# Patient Record
Sex: Male | Born: 1970 | Race: White | Hispanic: No | Smoking: Former smoker
Health system: Southern US, Community
[De-identification: ages and names within clinical notes are randomized; demographics above are authoritative.]

## PROBLEM LIST (undated history)

## (undated) DIAGNOSIS — C189 Malignant neoplasm of colon, unspecified: Secondary | ICD-10-CM

## (undated) DIAGNOSIS — R569 Unspecified convulsions: Secondary | ICD-10-CM

## (undated) DIAGNOSIS — I2510 Atherosclerotic heart disease of native coronary artery without angina pectoris: Secondary | ICD-10-CM

## (undated) DIAGNOSIS — E119 Type 2 diabetes mellitus without complications: Secondary | ICD-10-CM

## (undated) DIAGNOSIS — E785 Hyperlipidemia, unspecified: Secondary | ICD-10-CM

## (undated) DIAGNOSIS — I10 Essential (primary) hypertension: Secondary | ICD-10-CM

## (undated) HISTORY — DX: Atherosclerotic heart disease of native coronary artery without angina pectoris: I25.10

## (undated) HISTORY — DX: Malignant neoplasm of colon, unspecified: C18.9

## (undated) HISTORY — DX: Type 2 diabetes mellitus without complications: E11.9

## (undated) HISTORY — DX: Essential (primary) hypertension: I10

## (undated) HISTORY — DX: Hyperlipidemia, unspecified: E78.5

---

## 2020-09-21 ENCOUNTER — Emergency Department (HOSPITAL_COMMUNITY): Payer: Medicare HMO

## 2020-09-21 ENCOUNTER — Inpatient Hospital Stay (HOSPITAL_COMMUNITY)
Admission: EM | Admit: 2020-09-21 | Discharge: 2020-10-05 | DRG: 330 | Disposition: A | Discharge status: Home Health | Payer: Medicare HMO | Attending: Internal Medicine | Admitting: Internal Medicine

## 2020-09-21 ENCOUNTER — Encounter (HOSPITAL_COMMUNITY): Payer: Self-pay | Admitting: Emergency Medicine

## 2020-09-21 ENCOUNTER — Other Ambulatory Visit: Payer: Self-pay

## 2020-09-21 DIAGNOSIS — R197 Diarrhea, unspecified: Secondary | ICD-10-CM | POA: Diagnosis not present

## 2020-09-21 DIAGNOSIS — R9430 Abnormal result of cardiovascular function study, unspecified: Secondary | ICD-10-CM | POA: Diagnosis not present

## 2020-09-21 DIAGNOSIS — Z982 Presence of cerebrospinal fluid drainage device: Secondary | ICD-10-CM

## 2020-09-21 DIAGNOSIS — G40909 Epilepsy, unspecified, not intractable, without status epilepticus: Secondary | ICD-10-CM | POA: Diagnosis present

## 2020-09-21 DIAGNOSIS — F419 Anxiety disorder, unspecified: Secondary | ICD-10-CM | POA: Diagnosis present

## 2020-09-21 DIAGNOSIS — E876 Hypokalemia: Secondary | ICD-10-CM | POA: Diagnosis not present

## 2020-09-21 DIAGNOSIS — I5032 Chronic diastolic (congestive) heart failure: Secondary | ICD-10-CM | POA: Diagnosis present

## 2020-09-21 DIAGNOSIS — D499 Neoplasm of unspecified behavior of unspecified site: Secondary | ICD-10-CM

## 2020-09-21 DIAGNOSIS — R739 Hyperglycemia, unspecified: Secondary | ICD-10-CM | POA: Diagnosis present

## 2020-09-21 DIAGNOSIS — Z20822 Contact with and (suspected) exposure to covid-19: Secondary | ICD-10-CM | POA: Diagnosis present

## 2020-09-21 DIAGNOSIS — E440 Moderate protein-calorie malnutrition: Secondary | ICD-10-CM | POA: Diagnosis present

## 2020-09-21 DIAGNOSIS — E861 Hypovolemia: Secondary | ICD-10-CM | POA: Diagnosis present

## 2020-09-21 DIAGNOSIS — E1165 Type 2 diabetes mellitus with hyperglycemia: Secondary | ICD-10-CM | POA: Diagnosis present

## 2020-09-21 DIAGNOSIS — E785 Hyperlipidemia, unspecified: Secondary | ICD-10-CM | POA: Diagnosis present

## 2020-09-21 DIAGNOSIS — D500 Iron deficiency anemia secondary to blood loss (chronic): Secondary | ICD-10-CM | POA: Diagnosis not present

## 2020-09-21 DIAGNOSIS — B3781 Candidal esophagitis: Secondary | ICD-10-CM | POA: Diagnosis present

## 2020-09-21 DIAGNOSIS — I712 Thoracic aortic aneurysm, without rupture: Secondary | ICD-10-CM | POA: Diagnosis present

## 2020-09-21 DIAGNOSIS — C186 Malignant neoplasm of descending colon: Secondary | ICD-10-CM | POA: Diagnosis not present

## 2020-09-21 DIAGNOSIS — Z8249 Family history of ischemic heart disease and other diseases of the circulatory system: Secondary | ICD-10-CM

## 2020-09-21 DIAGNOSIS — C182 Malignant neoplasm of ascending colon: Secondary | ICD-10-CM | POA: Diagnosis present

## 2020-09-21 DIAGNOSIS — R7989 Other specified abnormal findings of blood chemistry: Secondary | ICD-10-CM | POA: Diagnosis not present

## 2020-09-21 DIAGNOSIS — E1169 Type 2 diabetes mellitus with other specified complication: Secondary | ICD-10-CM | POA: Diagnosis present

## 2020-09-21 DIAGNOSIS — E669 Obesity, unspecified: Secondary | ICD-10-CM | POA: Diagnosis present

## 2020-09-21 DIAGNOSIS — E871 Hypo-osmolality and hyponatremia: Secondary | ICD-10-CM | POA: Diagnosis present

## 2020-09-21 DIAGNOSIS — R0600 Dyspnea, unspecified: Secondary | ICD-10-CM | POA: Diagnosis not present

## 2020-09-21 DIAGNOSIS — R195 Other fecal abnormalities: Secondary | ICD-10-CM | POA: Diagnosis present

## 2020-09-21 DIAGNOSIS — Z6831 Body mass index (BMI) 31.0-31.9, adult: Secondary | ICD-10-CM | POA: Diagnosis not present

## 2020-09-21 DIAGNOSIS — D630 Anemia in neoplastic disease: Secondary | ICD-10-CM | POA: Diagnosis present

## 2020-09-21 DIAGNOSIS — R569 Unspecified convulsions: Secondary | ICD-10-CM

## 2020-09-21 DIAGNOSIS — I2510 Atherosclerotic heart disease of native coronary artery without angina pectoris: Secondary | ICD-10-CM | POA: Diagnosis present

## 2020-09-21 DIAGNOSIS — Z885 Allergy status to narcotic agent status: Secondary | ICD-10-CM

## 2020-09-21 DIAGNOSIS — Z79899 Other long term (current) drug therapy: Secondary | ICD-10-CM

## 2020-09-21 DIAGNOSIS — Z801 Family history of malignant neoplasm of trachea, bronchus and lung: Secondary | ICD-10-CM

## 2020-09-21 DIAGNOSIS — I509 Heart failure, unspecified: Secondary | ICD-10-CM

## 2020-09-21 DIAGNOSIS — I110 Hypertensive heart disease with heart failure: Secondary | ICD-10-CM | POA: Diagnosis present

## 2020-09-21 DIAGNOSIS — R0602 Shortness of breath: Secondary | ICD-10-CM | POA: Diagnosis present

## 2020-09-21 DIAGNOSIS — D62 Acute posthemorrhagic anemia: Secondary | ICD-10-CM | POA: Diagnosis present

## 2020-09-21 DIAGNOSIS — R931 Abnormal findings on diagnostic imaging of heart and coronary circulation: Secondary | ICD-10-CM | POA: Diagnosis not present

## 2020-09-21 DIAGNOSIS — I700 Atherosclerosis of aorta: Secondary | ICD-10-CM | POA: Diagnosis present

## 2020-09-21 DIAGNOSIS — Z833 Family history of diabetes mellitus: Secondary | ICD-10-CM

## 2020-09-21 DIAGNOSIS — Z87891 Personal history of nicotine dependence: Secondary | ICD-10-CM | POA: Diagnosis not present

## 2020-09-21 DIAGNOSIS — E7800 Pure hypercholesterolemia, unspecified: Secondary | ICD-10-CM | POA: Diagnosis not present

## 2020-09-21 DIAGNOSIS — D649 Anemia, unspecified: Secondary | ICD-10-CM | POA: Diagnosis not present

## 2020-09-21 DIAGNOSIS — R778 Other specified abnormalities of plasma proteins: Secondary | ICD-10-CM | POA: Diagnosis present

## 2020-09-21 DIAGNOSIS — Z880 Allergy status to penicillin: Secondary | ICD-10-CM

## 2020-09-21 DIAGNOSIS — D509 Iron deficiency anemia, unspecified: Secondary | ICD-10-CM | POA: Diagnosis present

## 2020-09-21 DIAGNOSIS — I2583 Coronary atherosclerosis due to lipid rich plaque: Secondary | ICD-10-CM | POA: Diagnosis not present

## 2020-09-21 HISTORY — DX: Unspecified convulsions: R56.9

## 2020-09-21 LAB — IRON AND TIBC
Iron: 11 ug/dL — ABNORMAL LOW (ref 45–182)
Saturation Ratios: 2 % — ABNORMAL LOW (ref 17.9–39.5)
TIBC: 494 ug/dL — ABNORMAL HIGH (ref 250–450)
UIBC: 483 ug/dL

## 2020-09-21 LAB — CBC
HCT: 23.7 % — ABNORMAL LOW (ref 39.0–52.0)
Hemoglobin: 6.1 g/dL — CL (ref 13.0–17.0)
MCH: 14.5 pg — ABNORMAL LOW (ref 26.0–34.0)
MCHC: 25.7 g/dL — ABNORMAL LOW (ref 30.0–36.0)
MCV: 56.3 fL — ABNORMAL LOW (ref 80.0–100.0)
Platelets: 585 10*3/uL — ABNORMAL HIGH (ref 150–400)
RBC: 4.21 MIL/uL — ABNORMAL LOW (ref 4.22–5.81)
RDW: 21.7 % — ABNORMAL HIGH (ref 11.5–15.5)
WBC: 12.7 10*3/uL — ABNORMAL HIGH (ref 4.0–10.5)
nRBC: 0.2 % (ref 0.0–0.2)

## 2020-09-21 LAB — BASIC METABOLIC PANEL
Anion gap: 11 (ref 5–15)
BUN: 9 mg/dL (ref 6–20)
CO2: 23 mmol/L (ref 22–32)
Calcium: 8.7 mg/dL — ABNORMAL LOW (ref 8.9–10.3)
Chloride: 95 mmol/L — ABNORMAL LOW (ref 98–111)
Creatinine, Ser: 0.7 mg/dL (ref 0.61–1.24)
GFR, Estimated: >60 mL/min (ref >60)
Glucose, Bld: 263 mg/dL — ABNORMAL HIGH (ref 70–99)
Potassium: 3.6 mmol/L (ref 3.5–5.1)
Sodium: 129 mmol/L — ABNORMAL LOW (ref 135–145)

## 2020-09-21 LAB — ABO/RH: ABO/RH(D): A POS

## 2020-09-21 LAB — BRAIN NATRIURETIC PEPTIDE: B Natriuretic Peptide: 297 pg/mL — ABNORMAL HIGH (ref 0.0–100.0)

## 2020-09-21 LAB — PREPARE RBC (CROSSMATCH)

## 2020-09-21 LAB — FERRITIN: Ferritin: 4 ng/mL — ABNORMAL LOW (ref 24–336)

## 2020-09-21 LAB — POC OCCULT BLOOD, ED: Fecal Occult Bld: POSITIVE — AB

## 2020-09-21 LAB — TROPONIN I (HIGH SENSITIVITY): Troponin I (High Sensitivity): 109 ng/L (ref <18)

## 2020-09-21 MED ORDER — SODIUM CHLORIDE 0.9 % IV BOLUS
500.0000 mL | Freq: Once | INTRAVENOUS | Status: DC
Start: 2020-09-21 — End: 2020-09-21

## 2020-09-21 MED ORDER — SODIUM CHLORIDE 0.9 % IV SOLN: 10.0000 mL/h | Freq: Once | INTRAVENOUS | Status: DC

## 2020-09-21 MED ORDER — SODIUM CHLORIDE 0.9 % IV BOLUS: 1000.0000 mL | Freq: Once | INTRAVENOUS | Status: DC

## 2020-09-21 NOTE — ED Provider Notes (Signed)
[facility] EMERGENCY DEPARTMENT Provider Note   CSN: [id number] Arrival date & time: 09/21/20  1646     History Chief Complaint  Patient presents with  . Shortness of Breath    Jeff Kelley is a 50 y.o. male with PMHx seizures who presents to the ED today with complaint of dyspnea on exertion for the past month. Pt reports that the symptoms appeared to get worse last week prompting him to come to the ED today. He recently moved to the area and does not have a PCP. He denies any chest pain. He does report he had a slight cough yesterday however has not had a cough the entire time he has felt short of breath. He does not feel short of breath at rest. He has not felt like he has had to increase the amount of pillows that he normally sleeps with. He has never had symptoms like this in the past. Denies fevers, chills, abdominal pain, nausea, vomiting, diarrhea, constipation,  Blood in stool, dizziness, lightheadedness, or any other associated symptoms. Pt denies hx of anemia. He has never had a colonoscopy in the past.   The history is provided by the patient and medical records.       Past Medical History:  Diagnosis Date  . Seizures [facility])     Patient Active Problem List   Diagnosis Date Noted  . Symptomatic anemia 09/21/2020    History reviewed. No pertinent surgical history.     No family history on file.     Home Medications Prior to Admission medications   Medication Sig Start Date End Date Taking? Authorizing Provider  acetaminophen (TYLENOL) 500 MG tablet Take 1,000 mg by mouth every 6 (six) hours as needed for headache (pain).   Yes [provider]  Cenobamate 14 x 50 MG & 14 x100 MG TBPK Take 50-100 mg by mouth See admin instructions. Take one 50 mg tablet daily for 2 weeks, then take one 100 mg tablet daily for 2 weeks, then fill 3rd month's pack   Yes [provider]  Cholecalciferol (VITAMIN D3) 125 MCG (5000 UT) CAPS Take  5,000 Units by mouth daily.   Yes [provider]  esomeprazole (NEXIUM) 20 MG capsule Take 20 mg by mouth daily at 12 noon.   Yes [provider]  lamoTRIgine (LAMICTAL) 200 MG tablet Take 400 mg by mouth 2 (two) times daily. 08/31/20  Yes [provider]  levETIRAcetam (KEPPRA) 750 MG tablet Take 1,500 mg by mouth See admin instructions. Previous dose 2 tablets (1500 mg) by mouth twice daily - reduce dose starting 09/21/2020 - take 1 1/2 tablets (1125 mg) by mouth twice daily for 3-4 weeks then take 1 tablet (750 mg) twice daily, then consult MD for further instructions. 08/27/20  Yes [provider]  LORazepam (ATIVAN) 1 MG tablet Take 1 mg by mouth 2 (two) times daily. 09/20/20  Yes [provider]  Multiple Vitamin (MULTIVITAMIN WITH MINERALS) TABS tablet Take 1 tablet by mouth daily.   Yes [provider]  PHENobarbital (LUMINAL) 64.8 MG tablet Take 64.8 mg by mouth 2 (two) times daily. 09/19/20  Yes [provider]    Allergies    Codeine and Penicillins  Review of Systems   Review of Systems  Constitutional: Negative for chills and fever.  Respiratory: Positive for cough and shortness of breath.   Cardiovascular: Negative for chest pain, palpitations and leg swelling.  Gastrointestinal: Negative for abdominal pain, blood in  stool, constipation, diarrhea, nausea and vomiting.  Neurological: Negative for dizziness and light-headedness.  All other systems reviewed and are negative.   Physical Exam Updated Vital Signs BP 127/88   Pulse (!) 102   Temp 98.8 F (37.1 C)   Resp (!) 27   SpO2 100%   Physical Exam Vitals and nursing note reviewed.  Constitutional:      Appearance: He is obese. He is not ill-appearing or diaphoretic.  HENT:     Head: Normocephalic and atraumatic.  Eyes:     Conjunctiva/sclera: Conjunctivae normal.  Cardiovascular:     Rate and Rhythm: Normal rate and regular rhythm.  Pulmonary:      Effort: Pulmonary effort is normal.     Breath sounds: Normal breath sounds. No decreased breath sounds, wheezing, rhonchi or rales.     Comments: Speaking in full sentences without difficulty. Satting 100% on RA. LCTAB.  Abdominal:     Palpations: Abdomen is soft.     Tenderness: There is no abdominal tenderness. There is no guarding or rebound.  Genitourinary:    Comments: Chaperone present for GU exam. No external hemorrhoids appreciated. Brown stool on gloved finger. Guaiac positive.  Musculoskeletal:     Cervical back: Neck supple.     Right lower leg: No edema.     Left lower leg: No edema.  Skin:    General: Skin is warm and dry.     Coloration: Skin is pale.  Neurological:     Mental Status: He is alert.     ED Results / Procedures / Treatments   Labs (all labs ordered are listed, but only abnormal results are displayed) Labs Reviewed  BASIC METABOLIC PANEL - Abnormal; Notable for the following components:      Result Value   Sodium 129 (*)    Chloride 95 (*)    Glucose, Bld 263 (*)    Calcium 8.7 (*)    All other components within normal limits  CBC - Abnormal; Notable for the following components:   WBC 12.7 (*)    RBC 4.21 (*)    Hemoglobin 6.1 (*)    HCT 23.7 (*)    MCV 56.3 (*)    MCH 14.5 (*)    MCHC 25.7 (*)    RDW 21.7 (*)    Platelets 585 (*)    All other components within normal limits  BRAIN NATRIURETIC PEPTIDE - Abnormal; Notable for the following components:   B Natriuretic Peptide 297.0 (*)    All other components within normal limits  IRON AND TIBC - Abnormal; Notable for the following components:   Iron 11 (*)    TIBC 494 (*)    Saturation Ratios 2 (*)    All other components within normal limits  FERRITIN - Abnormal; Notable for the following components:   Ferritin 4 (*)    All other components within normal limits  POC OCCULT BLOOD, ED - Abnormal; Notable for the following components:   Fecal Occult Bld POSITIVE (*)    All other  components within normal limits  TROPONIN I (HIGH SENSITIVITY) - Abnormal; Notable for the following components:   Troponin I (High Sensitivity) 109 (*)    All other components within normal limits  SARS CORONAVIRUS 2 (TAT 6-24 HRS)  TYPE AND SCREEN  PREPARE RBC (CROSSMATCH)  ABO/RH  TROPONIN I (HIGH SENSITIVITY)    EKG None  Radiology DG Chest 2 View  Result Date: 09/21/2020 CLINICAL DATA:  Shortness of breath. EXAM: CHEST -  2 VIEW COMPARISON:  None. FINDINGS: The heart size is unremarkable. There are prominent interstitial lung markings bilaterally. There are trace to small bilateral pleural effusions, right greater than left. There is no pneumothorax. No acute osseous abnormality. IMPRESSION: Prominent interstitial lung markings with small bilateral pleural effusions, right greater than left. Findings may be secondary to congestive heart failure versus an atypical infectious process. Electronically Signed   By: Constance Holster M.D.   On: 09/21/2020 17:51    Procedures .Critical Care Performed by: Eustaquio Maize, PA-C Authorized by: Eustaquio Maize, PA-C   Critical care provider statement:    Critical care time (minutes):  45   Critical care was time spent personally by me on the following activities:  Discussions with consultants, evaluation of patient's response to treatment, examination of patient, ordering and performing treatments and interventions, ordering and review of laboratory studies, ordering and review of radiographic studies, pulse oximetry, re-evaluation of patient's condition, obtaining history from patient or surrogate and review of old charts     Medications Ordered in ED Medications  0.9 %  sodium chloride infusion (has no administration in time range)    ED Course  I have reviewed the triage vital signs and the nursing notes.  Pertinent labs & imaging results that were available during my care of the patient were reviewed by me and considered in my  medical decision making (see chart for details).    MDM Rules/Calculators/A&P                          50 year old male presents the ED today complaining of dyspnea on exertion for the past month, worsening over the last week.  No other symptoms.  He recently moved to the area does not have a PCP.  On arrival to the ED patient is afebrile.  He is tachycardic at 115.  He is satting 100% on room air.  He had lab work done while in the waiting room including a CBC which does show a hemoglobin of 6.1.  No previous to compare to.  Patient's MCV also decreased.  He is noted to have a leukocytosis of 12.7.  BMP with a sodium 129, glucose 263, chloride 95.  Remainder of electrolytes within normal limits.  Patient denies history of diabetes.  He denies any history of alcohol use.  On exam patient appears pale.  He is speaking in full sentences without any increased work of breathing.  He is mildly tachycardic.  Suspect this is related to his anemia.  A GU exam was performed with chaperone at bedside which did show brown stool on gloved finger, no external hemorrhoids appreciated, it has returned guaiac positive.  We will plan for blood transfusion and admission to the hospital.  I suspect this is the cause of patient's shortness of breath.  He did have a chest x-ray done which did show concern for possible just of heart failure versus atypical infection.  In the setting of his leukocytosis question if this is related to atypical infection however he denies any fevers or chills.  We will plan to add on a BNP to assess for CHF, he does not appear volume overloaded at this time. Denies any recent sick contacts; he did have a cough yesterday but none today and none in the past month with his symptoms.   BNP 297; do not have previous to compare to. Pt may need ECHO while in the hospital for further evaluation given findings on  CXR. Will hold off on antibiotics at this time; his SOB does not sound infectious today and is  likely from his anemia. Discussed case with attending physician Dr. Alvino Chapel who agrees with plan. Will consult for admission.   Discussed case with Dr. Myna Hidalgo [facility] who agrees to evaluate patient for admission.  This note was prepared using Dragon voice recognition software and may include unintentional dictation errors due to the inherent limitations of voice recognition software.  Final Clinical Impression(s) / ED Diagnoses Final diagnoses:  Symptomatic anemia  SOB (shortness of breath)    Rx / DC Orders ED Discharge Orders    None       Eustaquio Maize, PA-C 09/21/20 2238    Davonna Belling, MD 09/21/20 2328

## 2020-09-21 NOTE — ED Triage Notes (Signed)
Patient complains of constant shortness of breath that is exacerbated by exertion. Patient denies pain, denies any other symptoms. Patient is alert, oriented, and in no apparent distress at this time.

## 2020-09-22 ENCOUNTER — Encounter (HOSPITAL_COMMUNITY): Payer: Self-pay | Admitting: Family Medicine

## 2020-09-22 ENCOUNTER — Inpatient Hospital Stay (HOSPITAL_COMMUNITY): Payer: Medicare HMO

## 2020-09-22 DIAGNOSIS — D649 Anemia, unspecified: Secondary | ICD-10-CM

## 2020-09-22 DIAGNOSIS — D500 Iron deficiency anemia secondary to blood loss (chronic): Secondary | ICD-10-CM | POA: Diagnosis not present

## 2020-09-22 DIAGNOSIS — R569 Unspecified convulsions: Secondary | ICD-10-CM

## 2020-09-22 DIAGNOSIS — I509 Heart failure, unspecified: Secondary | ICD-10-CM

## 2020-09-22 DIAGNOSIS — R739 Hyperglycemia, unspecified: Secondary | ICD-10-CM | POA: Diagnosis present

## 2020-09-22 DIAGNOSIS — R778 Other specified abnormalities of plasma proteins: Secondary | ICD-10-CM | POA: Diagnosis present

## 2020-09-22 DIAGNOSIS — E871 Hypo-osmolality and hyponatremia: Secondary | ICD-10-CM | POA: Diagnosis present

## 2020-09-22 DIAGNOSIS — R7989 Other specified abnormal findings of blood chemistry: Secondary | ICD-10-CM | POA: Diagnosis not present

## 2020-09-22 DIAGNOSIS — R195 Other fecal abnormalities: Secondary | ICD-10-CM

## 2020-09-22 DIAGNOSIS — R0600 Dyspnea, unspecified: Secondary | ICD-10-CM

## 2020-09-22 DIAGNOSIS — D509 Iron deficiency anemia, unspecified: Secondary | ICD-10-CM | POA: Diagnosis present

## 2020-09-22 LAB — CBC
HCT: 30.2 % — ABNORMAL LOW (ref 39.0–52.0)
HCT: 31.4 % — ABNORMAL LOW (ref 39.0–52.0)
Hemoglobin: 8.6 g/dL — ABNORMAL LOW (ref 13.0–17.0)
Hemoglobin: 8.9 g/dL — ABNORMAL LOW (ref 13.0–17.0)
MCH: 17.3 pg — ABNORMAL LOW (ref 26.0–34.0)
MCH: 17.1 pg — ABNORMAL LOW (ref 26.0–34.0)
MCHC: 28.5 g/dL — ABNORMAL LOW (ref 30.0–36.0)
MCHC: 28.3 g/dL — ABNORMAL LOW (ref 30.0–36.0)
MCV: 60.8 fL — ABNORMAL LOW (ref 80.0–100.0)
MCV: 60.3 fL — ABNORMAL LOW (ref 80.0–100.0)
Platelets: 520 10*3/uL — ABNORMAL HIGH (ref 150–400)
Platelets: 548 10*3/uL — ABNORMAL HIGH (ref 150–400)
RBC: 4.97 MIL/uL (ref 4.22–5.81)
RBC: 5.21 MIL/uL (ref 4.22–5.81)
RDW: 27.6 % — ABNORMAL HIGH (ref 11.5–15.5)
RDW: 27.7 % — ABNORMAL HIGH (ref 11.5–15.5)
WBC: 15 10*3/uL — ABNORMAL HIGH (ref 4.0–10.5)
WBC: 16.9 10*3/uL — ABNORMAL HIGH (ref 4.0–10.5)
nRBC: 0.2 % (ref 0.0–0.2)
nRBC: 0.2 % (ref 0.0–0.2)

## 2020-09-22 LAB — ECHOCARDIOGRAM COMPLETE
AR max vel: 3.73 cm2
AV Area VTI: 3.63 cm2
AV Area mean vel: 3.89 cm2
AV Mean grad: 6 mmHg
AV Peak grad: 11.3 mmHg
Ao pk vel: 1.68 m/s
Area-P 1/2: 5.23 cm2
Height: 66 in
MV M vel: 5.29 m/s
MV Peak grad: 111.9 mmHg
S' Lateral: 4.7 cm
Weight: 3231.06 oz

## 2020-09-22 LAB — SARS CORONAVIRUS 2 (TAT 6-24 HRS): SARS Coronavirus 2: NEGATIVE

## 2020-09-22 LAB — GLUCOSE, CAPILLARY
Glucose-Capillary: 141 mg/dL — ABNORMAL HIGH (ref 70–99)
Glucose-Capillary: 178 mg/dL — ABNORMAL HIGH (ref 70–99)
Glucose-Capillary: 182 mg/dL — ABNORMAL HIGH (ref 70–99)
Glucose-Capillary: 184 mg/dL — ABNORMAL HIGH (ref 70–99)
Glucose-Capillary: 206 mg/dL — ABNORMAL HIGH (ref 70–99)
Glucose-Capillary: 215 mg/dL — ABNORMAL HIGH (ref 70–99)

## 2020-09-22 LAB — HEMOGLOBIN A1C
Hgb A1c MFr Bld: 9.6 % — ABNORMAL HIGH (ref 4.8–5.6)
Mean Plasma Glucose: 228.82 mg/dL

## 2020-09-22 LAB — TROPONIN I (HIGH SENSITIVITY): Troponin I (High Sensitivity): 110 ng/L (ref <18)

## 2020-09-22 LAB — HIV ANTIBODY (ROUTINE TESTING W REFLEX): HIV Screen 4th Generation wRfx: NONREACTIVE

## 2020-09-22 LAB — MRSA PCR SCREENING: MRSA by PCR: NEGATIVE

## 2020-09-22 LAB — HEMOGLOBIN AND HEMATOCRIT, BLOOD
HCT: 24.9 % — ABNORMAL LOW (ref 39.0–52.0)
Hemoglobin: 6.8 g/dL — CL (ref 13.0–17.0)

## 2020-09-22 LAB — CBG MONITORING, ED: Glucose-Capillary: 171 mg/dL — ABNORMAL HIGH (ref 70–99)

## 2020-09-22 MED ORDER — SODIUM CHLORIDE 0.9 % IV SOLN: 250.0000 mL | INTRAVENOUS | Status: DC | PRN

## 2020-09-22 MED ORDER — ONDANSETRON HCL 4 MG/2ML IJ SOLN: 4.0000 mg | Freq: Four times a day (QID) | INTRAMUSCULAR | Status: DC | PRN

## 2020-09-22 MED ORDER — LEVETIRACETAM 100 MG/ML PO SOLN
1125.0000 mg | Freq: Two times a day (BID) | ORAL | Status: DC
  Administered 2020-09-22 – 2020-09-28 (×14): 1125 mg via ORAL
  Filled 2020-09-22 (×15): qty 15

## 2020-09-22 MED ORDER — PHENOBARBITAL 32.4 MG PO TABS
64.8000 mg | ORAL_TABLET | Freq: Two times a day (BID) | ORAL | Status: DC
  Administered 2020-09-22 – 2020-09-28 (×13): 64.8 mg via ORAL
  Filled 2020-09-22 (×14): qty 2

## 2020-09-22 MED ORDER — INSULIN ASPART 100 UNIT/ML ~~LOC~~ SOLN
0.0000 [IU] | SUBCUTANEOUS | Status: DC
  Administered 2020-09-22 (×2): 1 [IU] via SUBCUTANEOUS
  Administered 2020-09-22: 2 [IU] via SUBCUTANEOUS
  Administered 2020-09-22 – 2020-09-24 (×6): 1 [IU] via SUBCUTANEOUS
  Administered 2020-09-24: 2 [IU] via SUBCUTANEOUS
  Administered 2020-09-28: 0 [IU] via SUBCUTANEOUS
  Administered 2020-09-30: 1 [IU] via SUBCUTANEOUS
  Administered 2020-10-02: 2 [IU] via SUBCUTANEOUS

## 2020-09-22 MED ORDER — PANTOPRAZOLE SODIUM 40 MG PO TBEC
40.0000 mg | DELAYED_RELEASE_TABLET | Freq: Every day | ORAL | Status: DC
  Administered 2020-09-22 – 2020-09-28 (×6): 40 mg via ORAL
  Filled 2020-09-22 (×7): qty 1

## 2020-09-22 MED ORDER — CENOBAMATE 14 X 50 MG & 14 X100 MG PO TBPK
100.0000 mg | ORAL_TABLET | Freq: Every day | ORAL | Status: DC
  Administered 2020-09-22 – 2020-10-01 (×8): 100 mg via ORAL
  Filled 2020-09-22 (×10): qty 1

## 2020-09-22 MED ORDER — LORAZEPAM 1 MG PO TABS
1.0000 mg | ORAL_TABLET | Freq: Two times a day (BID) | ORAL | Status: DC | PRN
  Administered 2020-09-23 – 2020-09-24 (×2): 1 mg via ORAL
  Filled 2020-09-22 (×2): qty 1

## 2020-09-22 MED ORDER — ACETAMINOPHEN 325 MG PO TABS
650.0000 mg | ORAL_TABLET | ORAL | Status: DC | PRN
  Administered 2020-09-23 – 2020-09-27 (×2): 650 mg via ORAL
  Filled 2020-09-22 (×2): qty 2

## 2020-09-22 MED ORDER — FUROSEMIDE 10 MG/ML IJ SOLN
40.0000 mg | Freq: Once | INTRAMUSCULAR | Status: AC
  Administered 2020-09-22: 40 mg via INTRAVENOUS
  Filled 2020-09-22: qty 4

## 2020-09-22 MED ORDER — LAMOTRIGINE 100 MG PO TABS
400.0000 mg | ORAL_TABLET | Freq: Two times a day (BID) | ORAL | Status: DC
  Administered 2020-09-22 – 2020-10-05 (×26): 400 mg via ORAL
  Filled 2020-09-22 (×25): qty 4
  Filled 2020-09-22: qty 1
  Filled 2020-09-22: qty 4

## 2020-09-22 NOTE — Progress Notes (Signed)
Pt refusing all morning meds, wanting to take them at 1500 per patient home schedule. MD notified

## 2020-09-22 NOTE — Consult Note (Addendum)
Referring Provider:  [facility]         Primary Care Physician:  Patient, No Pcp Per Primary Gastroenterologist:  unassigned           We were asked to see this patient for:   Iron deficiency anemia and heme positive stool                Attending physician's note   I have taken a history, examined the patient and reviewed the chart. I agree with the Advanced Practitioner's note, impression and recommendations.  50 year old male admitted with severe symptomatic anemia, low MCV concerning for severe iron deficiency and fecal Hemoccult positive  Troponin mildly elevated, possibly secondary to demand ischemia.  Cardiology is following him, plan to get echocardiogram today to exclude CHF  Soft diet today, clears tomorrow and start bowel prep for EGD and colonoscopy tentatively scheduled for Monday Monitor hemoglobin and transfuse if below 7  Check iron panel, ferritin, B12 and folate  The risks and benefits as well as alternatives of endoscopic procedure(s) have been discussed and reviewed. All questions answered. The patient agrees to proceed.   The patient was provided an opportunity to ask questions and all were answered. The patient agreed with the plan and demonstrated an understanding of the instructions.  Damaris Hippo , MD [phone]    ASSESSMENT / PLAN:    # 50 yo male with symptomatic iron deficiency anemia and FOBT+. Patient doesn't know if he has ever had any overt bleeding ( doesn't look).   Rule out gastrointestinal neoplasm, especially given weight loss.  --Patient needs EGD and colonoscopy. Will plan for procedures on Monday if medically stable. He is getting an echocardiogram today. CXR raises some concern for CHF. The risks and benefits of colonoscopy with possible polypectomy / biopsies were discussed and the patient agrees to proceed.   # Weight loss, 30 pounds over last 8 months. He attributes this to diminished appetite but also there has been some  limits on his access to food due to finances.   # Elevated troponin. No chest pain. Demand ischemia? Getting echo today.   # Elevated glucose. No known history of diabetes but hgb A1c is 9.6.   # Seizures, on medication    HPI:                                                                                                                             Chief Complaint: anemia.   Jeff Kelley is a 50 y.o. male with a history of anxiety and seizures, who presented to ED yesterday with shortness of breath, especially with exertion. In ED he was tachycardic, tachypneic. Labs revealed hyponatremia, normal renal function, BNP 297, troponin 109. Hgb 6.1, mcv of 56, elevated platelet count. His Ferritin is 4. CXR suggested CHF vrs atypical infectious process.   Patient admitted for symptomatic anemia. He has no chest pain, troponin is elevated. On telemetry. [name] doesn't  know if he has had any overt bleeding ( doesn't look). He denies abdominal pain, nausea / vomiting. Over the last 8 months he has lost 30 pounds. Weight loss partly due to decreased appetite but also limit on amount of food he can pay for. He takes Ibuprofen but not a heavy consumer. He denies bowel changes. No [place] of colon or stomach cancer.     Past Medical History:  Diagnosis Date  . Seizures ([place])     History reviewed. No pertinent surgical history.  Prior to Admission medications   Medication Sig Start Date End Date Taking? Authorizing Provider  acetaminophen (TYLENOL) 500 MG tablet Take 1,000 mg by mouth every 6 (six) hours as needed for headache (pain).   Yes [provider]  Cenobamate 14 x 50 MG & 14 x100 MG TBPK Take 50-100 mg by mouth See admin instructions. Take one 50 mg tablet daily for 2 weeks, then take one 100 mg tablet daily for 2 weeks, then fill 3rd month's pack   Yes [provider]  Cholecalciferol (VITAMIN D3) 125 MCG (5000 UT) CAPS Take 5,000 Units by mouth daily.   Yes [provider]  esomeprazole (NEXIUM) 20 MG capsule Take 20 mg by mouth daily at 12 noon.   Yes [provider]  lamoTRIgine (LAMICTAL) 200 MG tablet Take 400 mg by mouth 2 (two) times daily. 08/31/20  Yes [provider]  levETIRAcetam (KEPPRA) 750 MG tablet Take 1,500 mg by mouth See admin instructions. Previous dose 2 tablets (1500 mg) by mouth twice daily - reduce dose starting 09/21/2020 - take 1 1/2 tablets (1125 mg) by mouth twice daily for 3-4 weeks then take 1 tablet (750 mg) twice daily, then consult MD for further instructions. 08/27/20  Yes [provider]  LORazepam (ATIVAN) 1 MG tablet Take 1 mg by mouth 2 (two) times daily. 09/20/20  Yes [provider]  Multiple Vitamin (MULTIVITAMIN WITH MINERALS) TABS tablet Take 1 tablet by mouth daily.   Yes [provider]  PHENobarbital (LUMINAL) 64.8 MG tablet Take 64.8 mg by mouth 2 (two) times daily. 09/19/20  Yes [provider]    Current Facility-Administered Medications  Medication Dose Route Frequency Provider Last Rate Last Admin  . 0.9 %  sodium chloride infusion  10 mL/hr Intravenous Once Opyd, Timothy S, MD      . 0.9 %  sodium chloride infusion  250 mL Intravenous PRN Opyd, Ilene Qua, MD      . acetaminophen (TYLENOL) tablet 650 mg  650 mg Oral Q4H PRN Opyd, Ilene Qua, MD      . Cenobamate TBPK 100 mg  100 mg Oral Daily Opyd, Ilene Qua, MD      . insulin aspart (novoLOG) injection 0-6 Units  0-6 Units Subcutaneous Q4H Opyd, Ilene Qua, MD   1 Units at 09/22/20 0904  . lamoTRIgine (LAMICTAL) tablet 400 mg  400 mg Oral BID Vianne Bulls, MD   400 mg at 09/22/20 6629  . levETIRAcetam (KEPPRA) 100 MG/ML solution 1,125 mg  1,125 mg Oral BID Vianne Bulls, MD   1,125 mg at 09/22/20 0216  . LORazepam (ATIVAN) tablet 1 mg  1 mg Oral BID PRN Opyd, Ilene Qua, MD      . ondansetron (ZOFRAN) injection 4 mg  4 mg Intravenous Q6H PRN Opyd, Ilene Qua, MD      . pantoprazole (PROTONIX) EC  tablet 40 mg  40 mg Oral Daily Opyd, Ilene Qua, MD      .  PHENobarbital (LUMINAL) tablet 64.8 mg  64.8 mg Oral BID Vianne Bulls, MD   64.8 mg at 09/22/20 8502    Allergies as of 09/21/2020 - Review Complete 09/21/2020  Allergen Reaction Noted  . Codeine Other (See Comments) 09/21/2020  . Penicillins Other (See Comments) 09/21/2020    Family History  Problem Relation Age of Onset  . Heart attack Mother   . Diabetes Mother   . Lung cancer Father     Social History   Socioeconomic History  . Marital status: Unknown    Spouse name: Not on file  . Number of children: Not on file  . Years of education: Not on file  . Highest education level: Not on file  Occupational History  . Not on file  Tobacco Use  . Smoking status: Former Research scientist (life sciences)  . Smokeless tobacco: Never Used  Substance and Sexual Activity  . Alcohol use: Not Currently  . Drug use: Not Currently  . Sexual activity: Not on file  Other Topics Concern  . Not on file  Social History Narrative  . Not on file   Social Determinants of Health   Financial Resource Strain: Not on file  Food Insecurity: Not on file  Transportation Needs: Not on file  Physical Activity: Not on file  Stress: Not on file  Social Connections: Not on file  Intimate Partner Violence: Not on file    Review of Systems: All systems reviewed and negative except where noted in HPI.  OBJECTIVE:    Physical Exam: Vital signs in last 24 hours: Temp:  [97.7 F (36.5 C)-99.5 F (37.5 C)] 97.7 F (36.5 C) (03/12 0832) Pulse Rate:  [95-115] 98 (03/12 0832) Resp:  [16-31] 20 (03/12 0832) BP: (122-151)/(85-99) 137/93 (03/12 0832) SpO2:  [91 %-100 %] 97 % (03/12 0832) Weight:  [91.6 kg] 91.6 kg (03/12 0321) Last BM Date: 09/20/20 General:   Alert  obese male in NAD Psych:  Pleasant, cooperative. Normal mood and affect. Eyes:  Pupils equal, sclera clear, no icterus.   Conjunctiva pink. Ears:  Normal auditory acuity. Nose:  No deformity,  discharge,  or lesions. Neck:  Supple; no masses Lungs:  Clear throughout to auscultation.   No wheezes, crackles, or rhonchi.  Heart:  Regular rate and rhythm;  no lower extremity edema Abdomen:  Soft, non-distended, nontender, BS active, no palp mass   Rectal:  Deferred  Msk:  Symmetrical without gross deformities. . Neurologic:  Alert and  oriented x4;  grossly normal neurologically. Skin:  Intact without significant lesions or rashes.  Filed Weights   09/22/20 0321  Weight: 91.6 kg     Scheduled inpatient medications . Cenobamate  100 mg Oral Daily  . insulin aspart  0-6 Units Subcutaneous Q4H  . lamoTRIgine  400 mg Oral BID  . levETIRAcetam  1,125 mg Oral BID  . pantoprazole  40 mg Oral Daily  . PHENobarbital  64.8 mg Oral BID      Intake/Output from previous day: 03/11 0701 - 03/12 0700 In: 594 [Blood:594] Out: -  Intake/Output this shift: Total I/O In: 455 [P.O.:455] Out: -    Lab Results: Recent Labs    09/21/20 1654 09/22/20 0019 09/22/20 0436  WBC 12.7*  --  15.0*  HGB 6.1* 6.8* 8.6*  HCT 23.7* 24.9* 30.2*  PLT 585*  --  520*   BMET Recent Labs    09/21/20 1654  NA 129*  K 3.6  CL 95*  CO2 23  GLUCOSE 263*  BUN  9  CREATININE 0.70  CALCIUM 8.7*   LFT No results for input(s): PROT, ALBUMIN, AST, ALT, ALKPHOS, BILITOT, BILIDIR, IBILI in the last 72 hours. PT/INR No results for input(s): LABPROT, INR in the last 72 hours. Hepatitis Panel No results for input(s): HEPBSAG, HCVAB, HEPAIGM, HEPBIGM in the last 72 hours.   . CBC Latest Ref Rng & Units 09/22/2020 09/22/2020 09/21/2020  WBC 4.0 - 10.5 K/uL 15.0(H) - 12.7(H)  Hemoglobin 13.0 - 17.0 g/dL 8.6(L) 6.8(LL) 6.1(LL)  Hematocrit 39.0 - 52.0 % 30.2(L) 24.9(L) 23.7(L)  Platelets 150 - 400 K/uL 520(H) - 585(H)    . CMP Latest Ref Rng & Units 09/21/2020  Glucose 70 - 99 mg/dL 263(H)  BUN 6 - 20 mg/dL 9  Creatinine 0.61 - 1.24 mg/dL 0.70  Sodium 135 - 145 mmol/L 129(L)  Potassium 3.5 -  5.1 mmol/L 3.6  Chloride 98 - 111 mmol/L 95(L)  CO2 22 - 32 mmol/L 23  Calcium 8.9 - 10.3 mg/dL 8.7(L)   Studies/Results: DG Chest 2 View  Result Date: 09/21/2020 CLINICAL DATA:  Shortness of breath. EXAM: CHEST - 2 VIEW COMPARISON:  None. FINDINGS: The heart size is unremarkable. There are prominent interstitial lung markings bilaterally. There are trace to small bilateral pleural effusions, right greater than left. There is no pneumothorax. No acute osseous abnormality. IMPRESSION: Prominent interstitial lung markings with small bilateral pleural effusions, right greater than left. Findings may be secondary to congestive heart failure versus an atypical infectious process. Electronically Signed   By: Constance Holster M.D.   On: 09/21/2020 17:51    Principal Problem:   Symptomatic anemia Active Problems:   Seizures ([facility])   Occult GI bleeding   Elevated troponin   CHF (congestive heart failure) ([facility])   Hyperglycemia   Iron deficiency anemia   Hyponatremia    Tye Savoy, NP-C @  09/22/2020, 9:59 AM

## 2020-09-22 NOTE — H&P (Signed)
History and Physical    Jeff Kelley MVH:[id number] DOB: [birth year] DOA: 09/21/2020  PCP: Patient, No Pcp Per   Patient coming from: home   Chief Complaint: SOB   HPI: Jeff Kelley is a 50 y.o. male with medical history significant for seizure disorder and anxiety, now presenting to the emergency department for evaluation of shortness of breath.  Patient began to notice shortness of breath with exertion approximately 1 month ago, has slowly progressed to the point where he is now dyspneic at rest.  He denies any significant cough, fevers, or chills.  He denies any chest pain or leg swelling.  Shortness of breath was much worse last night when he was laying down and woke him from sleep in a panic but he notes a history of anxiety and panic and is unable to say whether his breathing is better when sitting up.  He denies any known history of heart disease.  ED Course: Upon arrival to the ED, patient is found to be afebrile, saturating well on room air, tachypneic, tachycardic, and with stable blood pressure.  EKG features sinus tachycardia and nonspecific ST-T abnormality.  Chest x-ray notable for prominent interstitial markings and small bilateral pleural effusions.  Chemistry panel notable for glucose 263 and sodium 129.  CBC features a leukocytosis 12,700, platelets 585,000, and hemoglobin 6.1 with MCV 56.3.  High-sensitivity troponin is 109 and BNP 297.  Fecal occult blood testing was positive.  RBC transfusion was ordered from the ED.  Review of Systems:  All other systems reviewed and apart from HPI, are negative.  Past Medical History:  Diagnosis Date  . Seizures (Jeff Kelley)     History reviewed. No pertinent surgical history.  Social History:   reports that he has quit smoking. He has never used smokeless tobacco. He reports previous alcohol use. He reports previous drug use.  Allergies  Allergen Reactions  . Codeine Other (See Comments)    Patient states he was told he was  allergic when he was [age] years old, unknown reaction.  Marland Kitchen Penicillins Other (See Comments)    Patient states he was told he was allergic when he was [age] years ago, unknown reaction    Family History  Problem Relation Age of Onset  . Heart attack Mother   . Diabetes Mother   . Lung cancer Father      Prior to Admission medications   Medication Sig Start Date End Date Taking? Authorizing Provider  acetaminophen (TYLENOL) 500 MG tablet Take 1,000 mg by mouth every 6 (six) hours as needed for headache (pain).   Yes [provider]  Cenobamate 14 x 50 MG & 14 x100 MG TBPK Take 50-100 mg by mouth See admin instructions. Take one 50 mg tablet daily for 2 weeks, then take one 100 mg tablet daily for 2 weeks, then fill 3rd month's pack   Yes [provider]  Cholecalciferol (VITAMIN D3) 125 MCG (5000 UT) CAPS Take 5,000 Units by mouth daily.   Yes [provider]  esomeprazole (NEXIUM) 20 MG capsule Take 20 mg by mouth daily at 12 noon.   Yes [provider]  lamoTRIgine (LAMICTAL) 200 MG tablet Take 400 mg by mouth 2 (two) times daily. 08/31/20  Yes [provider]  levETIRAcetam (KEPPRA) 750 MG tablet Take 1,500 mg by mouth See admin instructions. Previous dose 2 tablets (1500 mg) by mouth twice daily - reduce dose starting 09/21/2020 - take 1 1/2 tablets (1125 mg) by mouth twice daily  for 3-4 weeks then take 1 tablet (750 mg) twice daily, then consult MD for further instructions. 08/27/20  Yes [provider]  LORazepam (ATIVAN) 1 MG tablet Take 1 mg by mouth 2 (two) times daily. 09/20/20  Yes [provider]  Multiple Vitamin (MULTIVITAMIN WITH MINERALS) TABS tablet Take 1 tablet by mouth daily.   Yes [provider]  PHENobarbital (LUMINAL) 64.8 MG tablet Take 64.8 mg by mouth 2 (two) times daily. 09/19/20  Yes [provider]    Physical Exam: Vitals:   09/21/20 2330 09/21/20 2345 09/22/20 0000 09/22/20 0014   BP: (!) 141/91 (!) 143/97 134/90   Pulse: 100 (!) 102 95   Resp: (!) 28 (!) 25 (!) 25   Temp:    99.5 F (37.5 C)  TempSrc:    Oral  SpO2: 99% 98% 94%     Constitutional: NAD, calm  Eyes: PERTLA, lids and conjunctivae normal ENMT: Mucous membranes are moist. Posterior pharynx clear of any exudate or lesions.   Neck: normal, supple, no masses, no thyromegaly Respiratory: Tachypneic, no wheezing. No pallor or cyanosis.  Cardiovascular: S1 & S2 heard, regular rate and rhythm. No extremity edema.   Abdomen: No distension, no tenderness, soft. Bowel sounds active.  Musculoskeletal: no clubbing / cyanosis. No joint deformity upper and lower extremities.   Skin: no significant rashes, lesions, ulcers. Warm, dry, well-perfused. Neurologic: CN 2-12 grossly intact. Sensation intact. Moving all extremities.  Psychiatric: Alert and oriented to person, place, and situation. Calm and cooperative.    Labs and Imaging on Admission: I have personally reviewed following labs and imaging studies  CBC: Recent Labs  Lab 09/21/20 1654  WBC 12.7*  HGB 6.1*  HCT 23.7*  MCV 56.3*  PLT 588*   Basic Metabolic Panel: Recent Labs  Lab 09/21/20 1654  NA 129*  K 3.6  CL 95*  CO2 23  GLUCOSE 263*  BUN 9  CREATININE 0.70  CALCIUM 8.7*   GFR: CrCl cannot be calculated (Unknown ideal weight.). Liver Function Tests: No results for input(s): AST, ALT, ALKPHOS, BILITOT, PROT, ALBUMIN in the last 168 hours. No results for input(s): LIPASE, AMYLASE in the last 168 hours. No results for input(s): AMMONIA in the last 168 hours. Coagulation Profile: No results for input(s): INR, PROTIME in the last 168 hours. Cardiac Enzymes: No results for input(s): CKTOTAL, CKMB, CKMBINDEX, TROPONINI in the last 168 hours. BNP (last 3 results) No results for input(s): PROBNP in the last 8760 hours. HbA1C: No results for input(s): HGBA1C in the last 72 hours. CBG: No results for input(s): GLUCAP in the last 168  hours. Lipid Profile: No results for input(s): CHOL, HDL, LDLCALC, TRIG, CHOLHDL, LDLDIRECT in the last 72 hours. Thyroid Function Tests: No results for input(s): TSH, T4TOTAL, FREET4, T3FREE, THYROIDAB in the last 72 hours. Anemia Panel: Recent Labs    09/21/20 2051  FERRITIN 4*  TIBC 494*  IRON 11*   Urine analysis: No results found for: COLORURINE, APPEARANCEUR, LABSPEC, PHURINE, GLUCOSEU, HGBUR, BILIRUBINUR, KETONESUR, PROTEINUR, UROBILINOGEN, NITRITE, LEUKOCYTESUR Sepsis Labs: @LABRCNTIP (procalcitonin:4,lacticidven:4) )No results found for this or any previous visit (from the past 240 hour(s)).   Radiological Exams on Admission: DG Chest 2 View  Result Date: 09/21/2020 CLINICAL DATA:  Shortness of breath. EXAM: CHEST - 2 VIEW COMPARISON:  None. FINDINGS: The heart size is unremarkable. There are prominent interstitial lung markings bilaterally. There are trace to small bilateral pleural effusions, right greater than left. There is no pneumothorax. No acute osseous abnormality.  IMPRESSION: Prominent interstitial lung markings with small bilateral pleural effusions, right greater than left. Findings may be secondary to congestive heart failure versus an atypical infectious process. Electronically Signed   By: Constance Holster M.D.   On: 09/21/2020 17:51    EKG: Independently reviewed. Sinus tachycardia, rate 118, non-specific ST-T abnormality.   Assessment/Plan   1. Symptomatic anemia; occult GI bleeding; IDA  - Presents with a month of progressive DOE and now SOB at rest and is found to have Hgb 6.1, positive FOBT without gross blood or melena, and anemia panel consistent with IDA  - Transfuse one unit to start, check post-transfusion H&H and continue transfusion to keep Hgb >7    2. CHF  - Presents with progressive SOB over a month and is found to have BNP 297 and CXR findings concerning for CHF  - Give one dose Lasix 40 mg IV now, follow strict I/Os and weights, check  echocardiogram   3. Elevated troponin  - HS troponin 109 then 110 without chest pain  - Continue cardiac monitoring, transfuse RBCs, check echo   4. Seizure disorder  - Continue home regimen   5. Anxiety  - Continue as-needed Ativan    6. Hyperglycemia  - Serum glucose 263 on admission without known hx of DM  - Check A1c, monitor CBGs, use SSI if needed     DVT prophylaxis: SCDs  Code Status: Full  Level of Care: Level of care: Progressive Family Communication: Significant other updated at bedside  Disposition Plan:  Patient is from: home  Anticipated d/c is to: Home  Anticipated d/c date is: 09/25/20 Patient currently: Pending echocardiogram, post-transfusion H&H, improved dyspnea   Consults called: none  Admission status: Inpatient     Vianne Bulls, MD [facility]  09/22/2020, 12:22 AM

## 2020-09-22 NOTE — Progress Notes (Signed)
PROGRESS NOTE  Jeff Kelley ZYY:[id number] DOB: 1970/11/14 DOA: 09/21/2020 PCP: Patient, No Pcp Per  HPI/Recap of past 24 hours: Jeff Kelley is a 50 y.o. male with medical history significant for seizure disorder and anxiety, now presenting to the emergency department for evaluation of shortness of breath.  Patient began to notice shortness of breath with exertion approximately 1 month ago, has slowly progressed to the point where he is now dyspneic at rest.  He denies any significant cough, fevers, or chills.  He denies any chest pain or leg swelling.  Shortness of breath was much worse last night when he was laying down and woke him from sleep in a panic but he notes a history of anxiety and panic and is unable to say whether his breathing is better when sitting up.  He denies any known history of heart disease.  ED Course: Upon arrival to the ED, patient is found to be afebrile, saturating well on room air, tachypneic, tachycardic, and with stable blood pressure.  EKG features sinus tachycardia and nonspecific ST-T abnormality.  Chest x-ray notable for prominent interstitial markings and small bilateral pleural effusions.  Chemistry panel notable for glucose 263 and sodium 129.  CBC features a leukocytosis 12,700, platelets 585,000, and hemoglobin 6.1 with MCV 56.3.  High-sensitivity troponin is 109 and BNP 297.  Fecal occult blood testing was positive.  RBC transfusion was ordered from the ED.  09/22/20: Patient was seen and examined at his bedside this morning.  He reports he feels better after his 2 units PRBCs transfusion.  He denies having any chest pain or abdominal pain.  Denies any melena or hematochezia this morning.  Last bowel movement was last night however did not look at his stool.  No prior history of colonoscopy or endoscopy.  No reported use of NSAIDs.  Presented with hemoglobin of 6.1 and positive FOBT with marked iron deficiency.    Assessment/Plan: Principal Problem:    Symptomatic anemia Active Problems:   Seizures (HCC)   Occult GI bleeding   Elevated troponin   CHF (congestive heart failure) (HCC)   Hyperglycemia   Iron deficiency anemia   Hyponatremia  Severe symptomatic iron deficiency anemia with positive FOBT. Presented with gradually worsening shortness of breath with exertion of approximately 1 month duration. On presentation, hemoglobin of 6.1K, MCV 56.3, positive FOBT, severe iron deficiency. 2 unit PRBCs ordered to be transfused. Repeated hemoglobin 8.6 on 09/22/2020. Seen by GI, plan for EGD and colonoscopy on Monday, 09/24/2020. Soft diet today, clears tomorrow and start bowel prep for EGD and colonoscopy. Continue to monitor H&H Transfuse for hemoglobin less than 7.0.  Elevated troponin likely secondary to demand ischemia in the setting of severe anemia Presented with troponin of 109, peaked at 110. Denies any anginal symptoms at the time of this visit. Continue to monitor on telemetry. Follow results of 2D echo done on 09/30/2020.  Leukocytosis, likely reactive in the setting of severe anemia. WBC uptrending 15.0 Nonseptic appearing Afebrile. Repeat CBC in the morning.  Hypovolemic hyponatremia Presented with serum sodium of 129 Repeat serum sodium and closely monitor  Type 2 diabetes with hyperglycemia Hemoglobin A1c 9.6 on 09/30/2020 Continue insulin sliding scale. Continue to monitor CBGs  Acute CHF, unspecified at this time.  No prior history. Presented with progressive shortness of breath over 1 month Elevated BNP and presentation to 97 Chest x-ray findings concerning for CHF. Received 1 dose of Lasix 40 mg IV x1. 2D echo is pending Strict I's  and O's and daily weight  Seizure disorder Continue home regimen.  Chronic anxiety Continue home regimen.     Code Status: Full code.  Family Communication: None at bedside  Disposition Plan: Likely will discharge to home once GI signs  off.  Consultants:  GI   Procedures:  2D echo on 09/22/2020.  Antimicrobials:  None.  DVT prophylaxis: SCDs.  Status is: Inpatient    Dispo:  Patient From: Home  Planned Disposition: Home  Anticipated discharge date: 09/24/2020 post EGD and colonoscopy, or when GI signs of.  Medically stable for discharge: No, ongoing management of severe symptomatic anemia.          Objective: Vitals:   09/22/20 0300 09/22/20 0321 09/22/20 0832 09/22/20 1159  BP: (!) 141/94 (!) 146/91 (!) 137/93 122/86  Pulse:  (!) 103 98 89  Resp:  18 20 20   Temp:  99.5 F (37.5 C) 97.7 F (36.5 C) 97.9 F (36.6 C)  TempSrc:  Oral Oral Oral  SpO2:  100% 97% 98%  Weight:  91.6 kg    Height:  5\' 6"  (1.676 m)      Intake/Output Summary (Last 24 hours) at 09/22/2020 1507 Last data filed at 09/22/2020 1040 Gross per 24 hour  Intake 1049 ml  Output 600 ml  Net 449 ml   Filed Weights   09/22/20 0321  Weight: 91.6 kg    Exam:  . General: 50 y.o. year-old male well developed well nourished in no acute distress.  Alert and oriented x3. . Cardiovascular: Regular rate and rhythm with no rubs or gallops.  No thyromegaly or JVD noted.   Marland Kitchen Respiratory: Clear to auscultation with no wheezes or rales. Good inspiratory effort. . Abdomen: Soft nontender nondistended with normal bowel sounds x4 quadrants. . Musculoskeletal: No lower extremity edema. 2/4 pulses in all 4 extremities. . Skin: No ulcerative lesions noted or rashes. . Psychiatry: Mood is appropriate for condition and setting   Data Reviewed: CBC: Recent Labs  Lab 09/21/20 1654 09/22/20 0019 09/22/20 0436  WBC 12.7*  --  15.0*  HGB 6.1* 6.8* 8.6*  HCT 23.7* 24.9* 30.2*  MCV 56.3*  --  60.8*  PLT 585*  --  735*   Basic Metabolic Panel: Recent Labs  Lab 09/21/20 1654  NA 129*  K 3.6  CL 95*  CO2 23  GLUCOSE 263*  BUN 9  CREATININE 0.70  CALCIUM 8.7*   GFR: Estimated Creatinine Clearance: 117 mL/min (by C-G formula  based on SCr of 0.7 mg/dL). Liver Function Tests: No results for input(s): AST, ALT, ALKPHOS, BILITOT, PROT, ALBUMIN in the last 168 hours. No results for input(s): LIPASE, AMYLASE in the last 168 hours. No results for input(s): AMMONIA in the last 168 hours. Coagulation Profile: No results for input(s): INR, PROTIME in the last 168 hours. Cardiac Enzymes: No results for input(s): CKTOTAL, CKMB, CKMBINDEX, TROPONINI in the last 168 hours. BNP (last 3 results) No results for input(s): PROBNP in the last 8760 hours. HbA1C: Recent Labs    09/22/20 0019  HGBA1C 9.6*   CBG: Recent Labs  Lab 09/22/20 0155 09/22/20 0420 09/22/20 0834 09/22/20 1200  GLUCAP 171* 206* 178* 182*   Lipid Profile: No results for input(s): CHOL, HDL, LDLCALC, TRIG, CHOLHDL, LDLDIRECT in the last 72 hours. Thyroid Function Tests: No results for input(s): TSH, T4TOTAL, FREET4, T3FREE, THYROIDAB in the last 72 hours. Anemia Panel: Recent Labs    09/21/20 2051  FERRITIN 4*  TIBC 494*  IRON 11*  Urine analysis: No results found for: COLORURINE, APPEARANCEUR, LABSPEC, PHURINE, GLUCOSEU, HGBUR, BILIRUBINUR, KETONESUR, PROTEINUR, UROBILINOGEN, NITRITE, LEUKOCYTESUR Sepsis Labs: @LABRCNTIP (procalcitonin:4,lacticidven:4)  ) Recent Results (from the past 240 hour(s))  SARS CORONAVIRUS 2 (TAT 6-24 HRS) Nasopharyngeal Nasopharyngeal Swab     Status: None   Collection Time: 09/21/20  8:45 PM   Specimen: Nasopharyngeal Swab  Result Value Ref Range Status   SARS Coronavirus 2 NEGATIVE NEGATIVE Final    Comment: (NOTE) SARS-CoV-2 target nucleic acids are NOT DETECTED.  The SARS-CoV-2 RNA is generally detectable in upper and lower respiratory specimens during the acute phase of infection. Negative results do not preclude SARS-CoV-2 infection, do not rule out co-infections with other pathogens, and should not be used as the sole basis for treatment or other patient management decisions. Negative results  must be combined with clinical observations, patient history, and epidemiological information. The expected result is Negative.  Fact Sheet for Patients: SugarRoll.be  Fact Sheet for Healthcare Providers: https://www.woods-mathews.com/  This test is not yet approved or cleared by the [place] FDA and  has been authorized for detection and/or diagnosis of SARS-CoV-2 by FDA under an Emergency Use Authorization (EUA). This EUA will remain  in effect (meaning this test can be used) for the duration of the COVID-19 declaration under Se ction 564(b)(1) of the Act, 21 U.S.C. section 360bbb-3(b)(1), unless the authorization is terminated or revoked sooner.  Performed at [facility], [place] [address], [place], [place] [address]   MRSA PCR Screening     Status: None   Collection Time: 09/22/20  4:25 AM   Specimen: Nasopharyngeal  Result Value Ref Range Status   MRSA by PCR NEGATIVE NEGATIVE Final    Comment:        The GeneXpert MRSA Assay (FDA approved for NASAL specimens only), is one component of a comprehensive MRSA colonization surveillance program. It is not intended to diagnose MRSA infection nor to guide or monitor treatment for MRSA infections. Performed at [facility], [place] [address], [place], [place] [address]       Studies: DG Chest 2 View  Result Date: 09/21/2020 CLINICAL DATA:  Shortness of breath. EXAM: CHEST - 2 VIEW COMPARISON:  None. FINDINGS: The heart size is unremarkable. There are prominent interstitial lung markings bilaterally. There are trace to small bilateral pleural effusions, right greater than left. There is no pneumothorax. No acute osseous abnormality. IMPRESSION: Prominent interstitial lung markings with small bilateral pleural effusions, right greater than left. Findings may be secondary to congestive heart failure versus an atypical infectious process. Electronically Signed   By: Constance Holster M.D.   On: 09/21/2020 17:51    Scheduled Meds: . Cenobamate  100 mg Oral Daily  . insulin aspart  0-6 Units Subcutaneous Q4H  . lamoTRIgine  400 mg Oral BID  . levETIRAcetam  1,125 mg Oral BID  . pantoprazole  40 mg Oral Daily  . PHENobarbital  64.8 mg Oral BID    Continuous Infusions: . sodium chloride    . sodium chloride       LOS: 1 day     Kayleen Memos, MD [facility] Pager [phone]  If 7PM-7AM, please contact night-coverage [email or web address] Password [facility] 09/22/2020, 3:07 PM

## 2020-09-23 ENCOUNTER — Other Ambulatory Visit: Payer: Self-pay

## 2020-09-23 DIAGNOSIS — D649 Anemia, unspecified: Secondary | ICD-10-CM | POA: Diagnosis not present

## 2020-09-23 DIAGNOSIS — R9430 Abnormal result of cardiovascular function study, unspecified: Secondary | ICD-10-CM | POA: Diagnosis not present

## 2020-09-23 DIAGNOSIS — R931 Abnormal findings on diagnostic imaging of heart and coronary circulation: Secondary | ICD-10-CM

## 2020-09-23 LAB — CBC
HCT: 27.5 % — ABNORMAL LOW (ref 39.0–52.0)
HCT: 30 % — ABNORMAL LOW (ref 39.0–52.0)
Hemoglobin: 7.7 g/dL — ABNORMAL LOW (ref 13.0–17.0)
Hemoglobin: 8.3 g/dL — ABNORMAL LOW (ref 13.0–17.0)
MCH: 17.1 pg — ABNORMAL LOW (ref 26.0–34.0)
MCH: 17.1 pg — ABNORMAL LOW (ref 26.0–34.0)
MCHC: 28 g/dL — ABNORMAL LOW (ref 30.0–36.0)
MCHC: 27.7 g/dL — ABNORMAL LOW (ref 30.0–36.0)
MCV: 61 fL — ABNORMAL LOW (ref 80.0–100.0)
MCV: 62 fL — ABNORMAL LOW (ref 80.0–100.0)
Platelets: 482 10*3/uL — ABNORMAL HIGH (ref 150–400)
Platelets: 477 10*3/uL — ABNORMAL HIGH (ref 150–400)
RBC: 4.51 MIL/uL (ref 4.22–5.81)
RBC: 4.84 MIL/uL (ref 4.22–5.81)
RDW: 27.1 % — ABNORMAL HIGH (ref 11.5–15.5)
RDW: 27.8 % — ABNORMAL HIGH (ref 11.5–15.5)
WBC: 13.2 10*3/uL — ABNORMAL HIGH (ref 4.0–10.5)
WBC: 12.4 10*3/uL — ABNORMAL HIGH (ref 4.0–10.5)
nRBC: 0.2 % (ref 0.0–0.2)
nRBC: 0 % (ref 0.0–0.2)

## 2020-09-23 LAB — BASIC METABOLIC PANEL
Anion gap: 7 (ref 5–15)
BUN: 11 mg/dL (ref 6–20)
CO2: 25 mmol/L (ref 22–32)
Calcium: 8.6 mg/dL — ABNORMAL LOW (ref 8.9–10.3)
Chloride: 98 mmol/L (ref 98–111)
Creatinine, Ser: 0.81 mg/dL (ref 0.61–1.24)
GFR, Estimated: >60 mL/min (ref >60)
Glucose, Bld: 176 mg/dL — ABNORMAL HIGH (ref 70–99)
Potassium: 3.5 mmol/L (ref 3.5–5.1)
Sodium: 130 mmol/L — ABNORMAL LOW (ref 135–145)

## 2020-09-23 LAB — TYPE AND SCREEN
ABO/RH(D): A POS
Antibody Screen: NEGATIVE
Unit division: 0
Unit division: 0

## 2020-09-23 LAB — BPAM RBC
Blood Product Expiration Date: 202204022359
Blood Product Expiration Date: 202204022359
ISSUE DATE / TIME: 202203112229
ISSUE DATE / TIME: 202203120149
Unit Type and Rh: 6200
Unit Type and Rh: 6200

## 2020-09-23 LAB — GLUCOSE, CAPILLARY
Glucose-Capillary: 115 mg/dL — ABNORMAL HIGH (ref 70–99)
Glucose-Capillary: 123 mg/dL — ABNORMAL HIGH (ref 70–99)
Glucose-Capillary: 149 mg/dL — ABNORMAL HIGH (ref 70–99)
Glucose-Capillary: 171 mg/dL — ABNORMAL HIGH (ref 70–99)
Glucose-Capillary: 172 mg/dL — ABNORMAL HIGH (ref 70–99)
Glucose-Capillary: 184 mg/dL — ABNORMAL HIGH (ref 70–99)

## 2020-09-23 LAB — IRON AND TIBC
Iron: 16 ug/dL — ABNORMAL LOW (ref 45–182)
Saturation Ratios: 3 % — ABNORMAL LOW (ref 17.9–39.5)
TIBC: 462 ug/dL — ABNORMAL HIGH (ref 250–450)
UIBC: 446 ug/dL

## 2020-09-23 LAB — VITAMIN B12: Vitamin B-12: 670 pg/mL (ref 180–914)

## 2020-09-23 LAB — FERRITIN: Ferritin: 8 ng/mL — ABNORMAL LOW (ref 24–336)

## 2020-09-23 LAB — TSH: TSH: 2.441 u[IU]/mL (ref 0.350–4.500)

## 2020-09-23 LAB — FOLATE: Folate: 19.6 ng/mL (ref >5.9)

## 2020-09-23 MED ORDER — PEG-KCL-NACL-NASULF-NA ASC-C 100 G PO SOLR
0.5000 | Freq: Once | ORAL | Status: AC
  Administered 2020-09-23: 100 g via ORAL
  Filled 2020-09-23: qty 1

## 2020-09-23 MED ORDER — PEG-KCL-NACL-NASULF-NA ASC-C 100 G PO SOLR: 1.0000 | Freq: Once | ORAL | Status: DC

## 2020-09-23 MED ORDER — PEG-KCL-NACL-NASULF-NA ASC-C 100 G PO SOLR
0.5000 | Freq: Once | ORAL | Status: AC
  Administered 2020-09-24: 100 g via ORAL
  Filled 2020-09-23: qty 1

## 2020-09-23 MED ORDER — ATORVASTATIN CALCIUM 80 MG PO TABS
80.0000 mg | ORAL_TABLET | Freq: Every day | ORAL | Status: DC
  Administered 2020-09-23 – 2020-09-28 (×5): 80 mg via ORAL
  Filled 2020-09-23 (×5): qty 1

## 2020-09-23 NOTE — Consult Note (Signed)
Cardiology Consultation:  Patient ID: PHU RECORD MRN: [id number]; DOB: Jul 21, 1970  Admit date: 09/21/2020 Date of Consult: 09/23/2020  Primary Care Provider: Patient, No Pcp Per Primary Cardiologist: No primary care provider on file.  Patient Profile:  Jeff Kelley is a 50 y.o. male with a hx of seizure disorder, diabetes (found this admission), obesity who is being seen today for the evaluation of abnormal echocardiogram at the request of Irene Pap, MD.  History of Present Illness:  Jeff Kelley presents with 1 month of weakness and fatigue.  He also reports progressively worsening shortness of breath.  He tells me he only has a medical history that he knew of to be seizure disorder.  He apparently had trauma as a child and had brain surgeries that left him with seizure disorders.  He is unable to drive.  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  His only medical care has been with neurology.  He presented to [facility]  He was found to be anemic with a hemoglobin of 6.1.  He was also found to have new onset diabetes.  A1c 9.6.  He also had troponin values checked.  Initial value was 109.  Value on repeat 110.  He reports he said no chest pain or pressure.  No tightness in his chest.  He has been given several units of blood.  Hemoglobin is still dropping.  He is severely iron deficient.  He has plans for EGD and colonoscopy tomorrow.  EKG on admission yesterday demonstrates sinus tachycardia with heart rate 118 he does have nonspecific ST-T changes noted with a questionable anteroseptal infarct.  His echocardiogram demonstrates normal LV function with an EF of 56%.  He was found to have hypokinesis of the mid to distal anterior septum and apical anterior segment.  Heart Pathway Score:       Past Medical History: Past Medical History:  Diagnosis Date  . Seizures ([place])     Past Surgical  History: History reviewed. No pertinent surgical history.   Home Medications:  Prior to Admission medications   Medication Sig Start Date End Date Taking? Authorizing Provider  acetaminophen (TYLENOL) 500 MG tablet Take 1,000 mg by mouth every 6 (six) hours as needed for headache (pain).   Yes [provider]  Cenobamate 14 x 50 MG & 14 x100 MG TBPK Take 50-100 mg by mouth See admin instructions. Take one 50 mg tablet daily for 2 weeks, then take one 100 mg tablet daily for 2 weeks, then fill 3rd month's pack   Yes [provider]  Cholecalciferol (VITAMIN D3) 125 MCG (5000 UT) CAPS Take 5,000 Units by mouth daily.   Yes [provider]  esomeprazole (NEXIUM) 20 MG capsule Take 20 mg by mouth daily at 12 noon.   Yes [provider]  lamoTRIgine (LAMICTAL) 200 MG tablet Take 400 mg by mouth 2 (two) times daily. 08/31/20  Yes [provider]  levETIRAcetam (KEPPRA) 750 MG tablet Take 1,500 mg by mouth See admin instructions. Previous dose 2 tablets (1500 mg) by mouth twice daily - reduce dose starting 09/21/2020 - take 1 1/2 tablets (1125 mg) by mouth twice daily for 3-4 weeks then take 1 tablet (750 mg) twice daily, then consult MD for further instructions. 08/27/20  Yes [provider]  LORazepam (ATIVAN) 1 MG tablet Take 1 mg by mouth 2 (two) times daily. 09/20/20  Yes  [provider]  Multiple Vitamin (MULTIVITAMIN WITH MINERALS) TABS tablet Take 1 tablet by mouth daily.   Yes [provider]  PHENobarbital (LUMINAL) 64.8 MG tablet Take 64.8 mg by mouth 2 (two) times daily. 09/19/20  Yes [provider]    Inpatient Medications: Scheduled Meds: . atorvastatin  80 mg Oral Daily  . Cenobamate  100 mg Oral Daily  . insulin aspart  0-6 Units Subcutaneous Q4H  . lamoTRIgine  400 mg Oral BID  . levETIRAcetam  1,125 mg Oral BID  . pantoprazole  40 mg Oral Daily  . peg 3350 powder  0.5 kit Oral Once   And  . [START ON  09/24/2020] peg 3350 powder  0.5 kit Oral Once  . PHENobarbital  64.8 mg Oral BID   Continuous Infusions: . sodium chloride    . sodium chloride     PRN Meds: sodium chloride, acetaminophen, LORazepam, ondansetron (ZOFRAN) IV  Allergies:    Allergies  Allergen Reactions  . Codeine Other (See Comments)    Patient states he was told he was allergic when he was [age] years old, unknown reaction.  Marland Kitchen Penicillins Other (See Comments)    Patient states he was told he was allergic when he was [age] years ago, unknown reaction    Social History:   Social History   Socioeconomic History  . Marital status: Unknown    Spouse name: Not on file  . Number of children: Not on file  . Years of education: Not on file  . Highest education level: Not on file  Occupational History  . Not on file  Tobacco Use  . Smoking status: Former Research scientist (life sciences)  . Smokeless tobacco: Never Used  Substance and Sexual Activity  . Alcohol use: Not Currently  . Drug use: Not Currently  . Sexual activity: Not on file  Other Topics Concern  . Not on file  Social History Narrative  . Not on file   Social Determinants of Health   Financial Resource Strain: Not on file  Food Insecurity: Not on file  Transportation Needs: Not on file  Physical Activity: Not on file  Stress: Not on file  Social Connections: Not on file  Intimate Partner Violence: Not on file     Family History:    Family History  Problem Relation Age of Onset  . Heart attack Mother   . Diabetes Mother   . Lung cancer Father      ROS:  All other ROS reviewed and negative. Pertinent positives noted in the HPI.     Physical Exam/Data:   Vitals:   09/23/20 0436 09/23/20 0803 09/23/20 0830 09/23/20 1214  BP: 132/89 120/83  138/87  Pulse: 98 84  85  Resp: '16 17  17  ' Temp: 98 F (36.7 C) 98.2 F (36.8 C)  98.2 F (36.8 C)  TempSrc: Oral     SpO2: 95% 96%  98%  Weight:   86.9 kg   Height:       No intake or output data in the 24  hours ending 09/23/20 1413  Last 3 Weights 09/23/2020 09/22/2020  Weight (lbs) 191 lb 8 oz 201 lb 15.1 oz  Weight (kg) 86.864 kg 91.6 kg    Body mass index is 30.91 kg/m.   General: Well nourished, well developed, in no acute distress Head: Atraumatic, normal size  Eyes: PEERLA, EOMI  Neck: Supple, no JVD Endocrine: No thryomegaly Cardiac: Normal S1, S2; RRR; no murmurs, rubs, or gallops Lungs: Clear  to auscultation bilaterally, no wheezing, rhonchi or rales  Abd: Soft, nontender, no hepatomegaly  Ext: No edema, pulses 2+ Musculoskeletal: No deformities, BUE and BLE strength normal and equal Skin: Warm and dry, no rashes   Neuro: Alert and oriented to person, place, time, and situation, CNII-XII grossly intact, no focal deficits  Psych: Normal mood and affect   EKG:  The EKG was personally reviewed and demonstrates: Sinus tachycardia heart rate 118, nonspecific ST-T changes, old anteroseptal infarct Telemetry:  Telemetry was personally reviewed and demonstrates: Sinus rhythm heart rate in the 80s  Relevant CV Studies: TTE 09/22/2020 1. Left ventricular ejection fraction by 3D volume is 56 %. The left  ventricle has normal function. The left ventricle demonstrates regional  wall motion abnormalities (see scoring diagram/findings for description).  Left ventricular diastolic parameters  are consistent with Grade II diastolic dysfunction (pseudonormalization).  2. Right ventricular systolic function is normal. The right ventricular  size is normal. Tricuspid regurgitation signal is inadequate for assessing  PA pressure.  3. The mitral valve is normal in structure. Trivial mitral valve  regurgitation. No evidence of mitral stenosis.  4. The aortic valve is grossly normal. Aortic valve regurgitation is not  visualized. No aortic stenosis is present.  5. Aortic dilatation noted. There is mild dilatation of the aortic root,  measuring 40 mm.  6. The inferior vena cava is normal in  size with greater than 50%  respiratory variability, suggesting right atrial pressure of 3 mmHg.   Laboratory Data: High Sensitivity Troponin:   Recent Labs  Lab 09/21/20 2051 09/21/20 2245  TROPONINIHS 109* 110*     Cardiac EnzymesNo results for input(s): TROPONINI in the last 168 hours. No results for input(s): TROPIPOC in the last 168 hours.  Chemistry Recent Labs  Lab 09/21/20 1654 09/23/20 0457  NA 129* 130*  K 3.6 3.5  CL 95* 98  CO2 23 25  GLUCOSE 263* 176*  BUN 9 11  CREATININE 0.70 0.81  CALCIUM 8.7* 8.6*  GFRNONAA >60 >60  ANIONGAP 11 7    No results for input(s): PROT, ALBUMIN, AST, ALT, ALKPHOS, BILITOT in the last 168 hours. Hematology Recent Labs  Lab 09/22/20 0436 09/22/20 1637 09/23/20 0457  WBC 15.0* 16.9* 13.2*  RBC 4.97 5.21 4.51  HGB 8.6* 8.9* 7.7*  HCT 30.2* 31.4* 27.5*  MCV 60.8* 60.3* 61.0*  MCH 17.3* 17.1* 17.1*  MCHC 28.5* 28.3* 28.0*  RDW 27.6* 27.7* 27.1*  PLT 520* 548* 482*   BNP Recent Labs  Lab 09/21/20 2051  BNP 297.0*    DDimer No results for input(s): DDIMER in the last 168 hours.  Radiology/Studies:  DG Chest 2 View  Result Date: 09/21/2020 CLINICAL DATA:  Shortness of breath. EXAM: CHEST - 2 VIEW COMPARISON:  None. FINDINGS: The heart size is unremarkable. There are prominent interstitial lung markings bilaterally. There are trace to small bilateral pleural effusions, right greater than left. There is no pneumothorax. No acute osseous abnormality. IMPRESSION: Prominent interstitial lung markings with small bilateral pleural effusions, right greater than left. Findings may be secondary to congestive heart failure versus an atypical infectious process. Electronically Signed   By: Constance Holster M.D.   On: 09/21/2020 17:51   ECHOCARDIOGRAM COMPLETE  Result Date: 09/22/2020    ECHOCARDIOGRAM REPORT   Patient Name:   JANCARLOS THRUN Date of Exam: 09/22/2020 Medical Rec #:  [id number]       Height:       66.0 in Accession #:  [id number]      Weight:       201.9 lb Date of Birth:  [birth year]       BSA:          2.008 m Patient Age:    20 years        BP:           137/93 mmHg Patient Gender: M               HR:           94 bpm. Exam Location:  Inpatient Procedure: 2D Echo, 3D Echo, Cardiac Doppler, Color Doppler and Strain Analysis Indications:    Dyspnea and elevated troponins  History:        Patient has no prior history of Echocardiogram examinations.                 CHF.  Sonographer:    Luisa Hart RDCS Referring Phys: [id number] [place]  1. Left ventricular ejection fraction by 3D volume is 56 %. The left ventricle has normal function. The left ventricle demonstrates regional wall motion abnormalities (see scoring diagram/findings for description). Left ventricular diastolic parameters are consistent with Grade II diastolic dysfunction (pseudonormalization).  2. Right ventricular systolic function is normal. The right ventricular size is normal. Tricuspid regurgitation signal is inadequate for assessing PA pressure.  3. The mitral valve is normal in structure. Trivial mitral valve regurgitation. No evidence of mitral stenosis.  4. The aortic valve is grossly normal. Aortic valve regurgitation is not visualized. No aortic stenosis is present.  5. Aortic dilatation noted. There is mild dilatation of the aortic root, measuring 40 mm.  6. The inferior vena cava is normal in size with greater than 50% respiratory variability, suggesting right atrial pressure of 3 mmHg. FINDINGS  Left Ventricle: Left ventricular ejection fraction by 3D volume is 56 %. The left ventricle has normal function. The left ventricle demonstrates regional wall motion abnormalities. Global longitudinal strain performed but not reported based on interpreter judgement due to suboptimal tracking. The left ventricular internal cavity size was normal in size. There is no left ventricular hypertrophy. Left ventricular diastolic parameters are  consistent with Grade II diastolic dysfunction (pseudonormalization).  LV Wall Scoring: The mid and distal anterior septum and apical anterior segment are hypokinetic. Right Ventricle: The right ventricular size is normal. No increase in right ventricular wall thickness. Right ventricular systolic function is normal. Tricuspid regurgitation signal is inadequate for assessing PA pressure. Left Atrium: Left atrial size was normal in size. Right Atrium: Right atrial size was normal in size. Pericardium: There is no evidence of pericardial effusion. Mitral Valve: The mitral valve is normal in structure. Trivial mitral valve regurgitation. No evidence of mitral valve stenosis. Tricuspid Valve: The tricuspid valve is normal in structure. Tricuspid valve regurgitation is not demonstrated. No evidence of tricuspid stenosis. Aortic Valve: The aortic valve is grossly normal. Aortic valve regurgitation is not visualized. No aortic stenosis is present. Aortic valve mean gradient measures 6.0 mmHg. Aortic valve peak gradient measures 11.3 mmHg. Aortic valve area, by VTI measures  3.63 cm. Pulmonic Valve: The pulmonic valve was grossly normal. Pulmonic valve regurgitation is trivial. No evidence of pulmonic stenosis. Aorta: Aortic dilatation noted. There is mild dilatation of the aortic root, measuring 40 mm. Venous: A systolic blunting flow pattern is recorded from the right lower pulmonary vein. The inferior vena cava is normal in size with greater than 50% respiratory variability, suggesting right atrial pressure  of 3 mmHg. IAS/Shunts: No atrial level shunt detected by color flow Doppler.  LEFT VENTRICLE PLAX 2D LVIDd:         5.00 cm         Diastology LVIDs:         4.70 cm         LV e' medial:    4.79 cm/s LV PW:         0.80 cm         LV E/e' medial:  28.8 LV IVS:        1.00 cm         LV e' lateral:   11.60 cm/s LVOT diam:     2.30 cm         LV E/e' lateral: 11.9 LV SV:         103 LV SV Index:   51 LVOT Area:      4.15 cm        3D Volume EF                                LV 3D EF:    Left                                             ventricular                                             ejection                                             fraction by                                             3D volume                                             is 56 %.                                 3D Volume EF:                                3D EF:        56 %  PULMONARY VEINS A Reversal Duration: 116.00 msec A Reversal Velocity: 16.10 cm/s Diastolic Velocity:  96.04 cm/s S/D Velocity:        5.40 Systolic Velocity:   98.11 cm/s LEFT ATRIUM           Index       RIGHT ATRIUM           Index LA diam:      4.70 cm 2.34 cm/m  RA Area:  13.00 cm LA Vol (A2C): 45.5 ml 22.66 ml/m RA Volume:   27.40 ml  13.65 ml/m  AORTIC VALVE                    PULMONIC VALVE AV Area (Vmax):    3.73 cm     PV Vmax:       1.09 m/s AV Area (Vmean):   3.89 cm     PV Vmean:      84.200 cm/s AV Area (VTI):     3.63 cm     PV VTI:        0.198 m AV Vmax:           168.00 cm/s  PV Peak grad:  4.7 mmHg AV Vmean:          109.000 cm/s PV Mean grad:  3.0 mmHg AV VTI:            0.284 m AV Peak Grad:      11.3 mmHg AV Mean Grad:      6.0 mmHg LVOT Vmax:         151.00 cm/s LVOT Vmean:        102.000 cm/s LVOT VTI:          0.248 m LVOT/AV VTI ratio: 0.87  AORTA Ao Root diam: 4.00 cm MITRAL VALVE MV Area (PHT): 5.23 cm     SHUNTS MV Decel Time: 145 msec     Systemic VTI:  0.25 m MR Peak grad: 111.9 mmHg    Systemic Diam: 2.30 cm MR Mean grad: 76.0 mmHg MR Vmax:      529.00 cm/s MR Vmean:     417.0 cm/s MV E velocity: 138.00 cm/s MV A velocity: 70.40 cm/s MV E/A ratio:  1.96 Cherlynn Kaiser MD Electronically signed by Cherlynn Kaiser MD Signature Date/Time: 09/22/2020/4:28:35 PM    Final     Assessment and Plan:   1. Abnormal Echo/Demand Ischemia -Mr. Gaudin has been admitted with presumed GI bleed and severe anemia.  Hemoccult is positive.  His  hemoglobin on arrival was 6.1.  Troponins were elevated but flat.  His EKG demonstrates nonspecific ST-T changes.  His echocardiogram demonstrates normal LV function with hypokinesis of the mid to apical anterior segment and apical septum.  He has no chest pain.  He reports his symptoms were shortness of breath related to his anemia.  His CVD risk factors include new onset diabetes which is just been found. -Overall, this does not represent an acute coronary syndrome.  This represents demand ischemia in the setting of severe anemia.  I suspect he likely has underlying obstructive disease in the mid LAD.  This would fit his wall motion abnormalities.  However, given his severe anemia there is not a lot we can do about this currently.  I would not recommend aspirin or heparin given his life-threatening presumed GI bleed.  Would recommend to continue with aggressive work-up of his anemia. -I think it would be reasonable for him to pursue an ischemia evaluation once his bleeding had resolved.  This can occur at a later date if not in the outpatient setting.  I see no need for this to occur now given his lack of symptoms.  He may just be able to follow-up as an outpatient. -In the interim we will start him on a high intensity statin.  We will check a fasting lipid profile tomorrow as well as a TSH.  I am ordering a repeat EKG since he  is no longer tachycardic to get a good look at his ST segments.  There was no evidence of ST elevation just nonspecific ST-T changes when he was tachycardic. -Given that his hemoglobin has continued to drop I would avoid any beta-blockers at this time.  There really is no need to treat this aggressively given his lack of symptoms and findings that are inconsistent with an acute coronary syndrome. -Cardiology consult service will follow along while he is here.  I would not delay his EGD or colonoscopy for ischemia evaluation.  For questions or updates, please contact [place] Please consult [email or web address] for contact info under   Signed, [place] T. Audie Box, MD, [place]  09/23/2020 2:13 PM

## 2020-09-23 NOTE — Plan of Care (Signed)

## 2020-09-23 NOTE — Progress Notes (Signed)
PROGRESS NOTE  Jeff Kelley STM:[id number] DOB: 06-04-1971 DOA: 09/21/2020 PCP: Patient, No Pcp Per  HPI/Recap of past 24 hours: Jeff Kelley is a 50 y.o. male with medical history significant for seizure disorder and anxiety, now presenting to the emergency department for evaluation of shortness of breath.  Patient began to notice shortness of breath with exertion approximately 1 month ago, slowly progressed to the point where he was dyspneic at rest. No significant cough, fevers, or chills.  No chest pain or leg swelling.  Shortness of breath was much worse last night when he was laying down and woke him from sleep in a panic but he notes a history of anxiety and panic and is unable to say whether his breathing is better when sitting up.  He denies any known history of heart disease.  ED Course: Upon arrival to the ED, patient is found to be afebrile, saturating well on room air, tachypneic, tachycardic, and with stable blood pressure.  EKG features sinus tachycardia and nonspecific ST-T abnormality.  Chest x-ray notable for prominent interstitial markings and small bilateral pleural effusions.  Chemistry panel notable for glucose 263 and sodium 129.  CBC features a leukocytosis 12,700, platelets 585,000, and hemoglobin 6.1 with MCV 56.3.  High-sensitivity troponin is 109 and BNP 297.  Fecal occult blood testing was positive.  RBC transfusion was ordered from the ED.  Seen by GI with plan for EGD/colonoscopy on 09/24/2020.  Currently on clear liquid diet with bowel prep.  N.p.o. after midnight.  09/23/20: Patient was seen and examined at bedside.  He has no new complaints at this time.  There were no acute events overnight.  He denies any melena or hematochezia.  Last bowel movement was brown in color.  No abdominal pain or nausea.  Denies having chest pain or dyspnea at rest.  Assessment/Plan: Principal Problem:   Symptomatic anemia Active Problems:   Seizures (HCC)   Occult GI bleeding    Elevated troponin   CHF (congestive heart failure) (HCC)   Hyperglycemia   Iron deficiency anemia   Hyponatremia  Severe symptomatic iron deficiency anemia with positive FOBT. Presented with gradually worsening shortness of breath with exertion of approximately 1 month duration. On presentation, hemoglobin of 6.1K, MCV 56.3, positive FOBT, severe iron deficiency. 2 unit PRBCs transfused on 09/22/2020. Hemoglobin this morning 7.7, repeat H&H Transfuse hemoglobin less than 8.0. Seen by GI, plan for EGD and colonoscopy on Monday, 09/24/2020.  Elevated troponin likely secondary to demand ischemia in the setting of severe anemia Presented with troponin of 109, peaked at 110. Denies any anginal symptoms at the time of this visit. Continue to monitor on telemetry. Follow results of 2D echo done on 09/30/2020.  Diastolic CHF/mid and distal anterior septum, apical anterior segments are hypokinetic. Presented with BNP 297 2D echo showing LVEF 56% left ventricle demonstrate regional wall motion abnormalities, left ventricle diastolic parameters are consistent with grade 2 diastolic dysfunction. Cardiology consult for further assessment and management.  Leukocytosis, likely reactive in the setting of severe anemia. WBC is downtrending 13 from 16 Nonseptic appearing Afebrile.  Hypovolemic hyponatremia Presented with serum sodium of 129 Uptrending 130. Repeat serum sodium and closely monitor  Type 2 diabetes with hyperglycemia Hemoglobin A1c 9.6 on 09/30/2020 Continue insulin sliding scale. Continue to monitor CBGs  Acute CHF, unspecified at this time.  No prior history. Presented with progressive shortness of breath over 1 month Elevated BNP and presentation to 97 Chest x-ray findings concerning for CHF. Received  1 dose of Lasix 40 mg IV x1. 2D echo done on 09/30/2020 showed LVEF 56%, the left ventricle demonstrate regional wall motion abnormalities, grade 2 diastolic dysfunction. Strict  I's and O's and daily weight  Seizure disorder Continue home regimen.  Chronic anxiety Continue home regimen.     Code Status: Full code.  Family Communication: None at bedside  Disposition Plan: Likely will discharge to home once GI and cardiology sign off.  Consultants:  GI  Cardiology   Procedures:  2D echo on 09/22/2020.  Antimicrobials:  None.  DVT prophylaxis: SCDs.  Status is: Inpatient    Dispo:  Patient From: Home  Planned Disposition: Home  Anticipated discharge date: 09/25/2020 post EGD and colonoscopy, or when GI and cardiology sign off.  Medically stable for discharge: No, ongoing management of severe symptomatic anemia.          Objective: Vitals:   09/23/20 0436 09/23/20 0803 09/23/20 0830 09/23/20 1214  BP: 132/89 120/83  138/87  Pulse: 98 84  85  Resp: _0 Temp: 98 F (36.7 C) 98.2 F (36.8 C)  98.2 F (36.8 C)  TempSrc: Oral     SpO2: 95% 96%  98%  Weight:   86.9 kg   Height:       No intake or output data in the 24 hours ending 09/23/20 1309 Filed Weights   09/22/20 0321 09/23/20 0830  Weight: 91.6 kg 86.9 kg    Exam:  . General: 50 y.o. year-old male well-developed well-nourished in no acute stress.  He is alert oriented x3. . Cardiovascular: Regular rate and rhythm no rubs or gallops.   Marland Kitchen Respiratory: Clear to auscultation no wheezes or rales. . Abdomen: Soft nontender no bowel sounds present.   . Musculoskeletal: No lower extremity edema bilaterally.   . Skin: No ulcerative lesions noted. Marland Kitchen Psychiatry: Mood is appropriate for condition and setting.   Data Reviewed: CBC: Recent Labs  Lab 09/21/20 1654 09/22/20 0019 09/22/20 0436 09/22/20 1637 09/23/20 0457  WBC 12.7*  --  15.0* 16.9* 13.2*  HGB 6.1* 6.8* 8.6* 8.9* 7.7*  HCT 23.7* 24.9* 30.2* 31.4* 27.5*  MCV 56.3*  --  60.8* 60.3* 61.0*  PLT 585*  --  520* 548* 494*   Basic Metabolic Panel: Recent Labs  Lab 09/21/20 1654 09/23/20 0457  NA  129* 130*  K 3.6 3.5  CL 95* 98  CO2 23 25  GLUCOSE 263* 176*  BUN 9 11  CREATININE 0.70 0.81  CALCIUM 8.7* 8.6*   GFR: Estimated Creatinine Clearance: 112.7 mL/min (by C-G formula based on SCr of 0.81 mg/dL). Liver Function Tests: No results for input(s): AST, ALT, ALKPHOS, BILITOT, PROT, ALBUMIN in the last 168 hours. No results for input(s): LIPASE, AMYLASE in the last 168 hours. No results for input(s): AMMONIA in the last 168 hours. Coagulation Profile: No results for input(s): INR, PROTIME in the last 168 hours. Cardiac Enzymes: No results for input(s): CKTOTAL, CKMB, CKMBINDEX, TROPONINI in the last 168 hours. BNP (last 3 results) No results for input(s): PROBNP in the last 8760 hours. HbA1C: Recent Labs    09/22/20 0019  HGBA1C 9.6*   CBG: Recent Labs  Lab 09/22/20 1940 09/22/20 2340 09/23/20 0327 09/23/20 0804 09/23/20 1216  GLUCAP 215* 184* 172* 149* 171*   Lipid Profile: No results for input(s): CHOL, HDL, LDLCALC, TRIG, CHOLHDL, LDLDIRECT in the last 72 hours. Thyroid Function Tests: No results for input(s): TSH, T4TOTAL, FREET4, T3FREE, THYROIDAB in the last  72 hours. Anemia Panel: Recent Labs    09/21/20 2051 09/23/20 0457  VITAMINB12  --  670  FOLATE  --  19.6  FERRITIN 4* 8*  TIBC 494* 462*  IRON 11* 16*   Urine analysis: No results found for: COLORURINE, APPEARANCEUR, LABSPEC, PHURINE, GLUCOSEU, HGBUR, BILIRUBINUR, KETONESUR, PROTEINUR, UROBILINOGEN, NITRITE, LEUKOCYTESUR Sepsis Labs: _0 (procalcitonin:4,lacticidven:4)  ) Recent Results (from the past 240 hour(s))  SARS CORONAVIRUS 2 (TAT 6-24 HRS) Nasopharyngeal Nasopharyngeal Swab     Status: None   Collection Time: 09/21/20  8:45 PM   Specimen: Nasopharyngeal Swab  Result Value Ref Range Status   SARS Coronavirus 2 NEGATIVE NEGATIVE Final    Comment: (NOTE) SARS-CoV-2 target nucleic acids are NOT DETECTED.  The SARS-CoV-2 RNA is generally detectable in upper and  lower respiratory specimens during the acute phase of infection. Negative results do not preclude SARS-CoV-2 infection, do not rule out co-infections with other pathogens, and should not be used as the sole basis for treatment or other patient management decisions. Negative results must be combined with clinical observations, patient history, and epidemiological information. The expected result is Negative.  Fact Sheet for Patients: SugarRoll.be  Fact Sheet for Healthcare Providers: https://www.woods-mathews.com/  This test is not yet approved or cleared by the [place] FDA and  has been authorized for detection and/or diagnosis of SARS-CoV-2 by FDA under an Emergency Use Authorization (EUA). This EUA will remain  in effect (meaning this test can be used) for the duration of the COVID-19 declaration under Se ction 564(b)(1) of the Act, 21 U.S.C. section 360bbb-3(b)(1), unless the authorization is terminated or revoked sooner.  Performed at [facility], [place] [address], [place], [place] [address]   MRSA PCR Screening     Status: None   Collection Time: 09/22/20  4:25 AM   Specimen: Nasopharyngeal  Result Value Ref Range Status   MRSA by PCR NEGATIVE NEGATIVE Final    Comment:        The GeneXpert MRSA Assay (FDA approved for NASAL specimens only), is one component of a comprehensive MRSA colonization surveillance program. It is not intended to diagnose MRSA infection nor to guide or monitor treatment for MRSA infections. Performed at [facility], [place] [address] [place], [place] [address]       Studies: ECHOCARDIOGRAM COMPLETE  Result Date: 09/22/2020    ECHOCARDIOGRAM REPORT   Patient Name:   Jeff Kelley Date of Exam: 09/22/2020 Medical Rec #:  [id number]       Height:       66.0 in Accession #:    [id number]      Weight:       201.9 lb Date of Birth:  [birth year]       BSA:          2.008 m Patient Age:    63  years        BP:           137/93 mmHg Patient Gender: M               HR:           94 bpm. Exam Location:  Inpatient Procedure: 2D Echo, 3D Echo, Cardiac Doppler, Color Doppler and Strain Analysis Indications:    Dyspnea and elevated troponins  History:        Patient has no prior history of Echocardiogram examinations.                 CHF.  Sonographer:    Luisa Hart RDCS Referring Phys: [id number]  1. Left ventricular ejection fraction by 3D volume is 56 %. The left ventricle has normal function. The left ventricle demonstrates regional wall motion abnormalities (see scoring diagram/findings for description). Left ventricular diastolic parameters are consistent with Grade II diastolic dysfunction (pseudonormalization).  2. Right ventricular systolic function is normal. The right ventricular size is normal. Tricuspid regurgitation signal is inadequate for assessing PA pressure.  3. The mitral valve is normal in structure. Trivial mitral valve regurgitation. No evidence of mitral stenosis.  4. The aortic valve is grossly normal. Aortic valve regurgitation is not visualized. No aortic stenosis is present.  5. Aortic dilatation noted. There is mild dilatation of the aortic root, measuring 40 mm.  6. The inferior vena cava is normal in size with greater than 50% respiratory variability, suggesting right atrial pressure of 3 mmHg. FINDINGS  Left Ventricle: Left ventricular ejection fraction by 3D volume is 56 %. The left ventricle has normal function. The left ventricle demonstrates regional wall motion abnormalities. Global longitudinal strain performed but not reported based on interpreter judgement due to suboptimal tracking. The left ventricular internal cavity size was normal in size. There is no left ventricular hypertrophy. Left ventricular diastolic parameters are consistent with Grade II diastolic dysfunction (pseudonormalization).  LV Wall Scoring: The mid and distal anterior septum  and apical anterior segment are hypokinetic. Right Ventricle: The right ventricular size is normal. No increase in right ventricular wall thickness. Right ventricular systolic function is normal. Tricuspid regurgitation signal is inadequate for assessing PA pressure. Left Atrium: Left atrial size was normal in size. Right Atrium: Right atrial size was normal in size. Pericardium: There is no evidence of pericardial effusion. Mitral Valve: The mitral valve is normal in structure. Trivial mitral valve regurgitation. No evidence of mitral valve stenosis. Tricuspid Valve: The tricuspid valve is normal in structure. Tricuspid valve regurgitation is not demonstrated. No evidence of tricuspid stenosis. Aortic Valve: The aortic valve is grossly normal. Aortic valve regurgitation is not visualized. No aortic stenosis is present. Aortic valve mean gradient measures 6.0 mmHg. Aortic valve peak gradient measures 11.3 mmHg. Aortic valve area, by VTI measures  3.63 cm. Pulmonic Valve: The pulmonic valve was grossly normal. Pulmonic valve regurgitation is trivial. No evidence of pulmonic stenosis. Aorta: Aortic dilatation noted. There is mild dilatation of the aortic root, measuring 40 mm. Venous: A systolic blunting flow pattern is recorded from the right lower pulmonary vein. The inferior vena cava is normal in size with greater than 50% respiratory variability, suggesting right atrial pressure of 3 mmHg. IAS/Shunts: No atrial level shunt detected by color flow Doppler.  LEFT VENTRICLE PLAX 2D LVIDd:         5.00 cm         Diastology LVIDs:         4.70 cm         LV e' medial:    4.79 cm/s LV PW:         0.80 cm         LV E/e' medial:  28.8 LV IVS:        1.00 cm         LV e' lateral:   11.60 cm/s LVOT diam:     2.30 cm         LV E/e' lateral: 11.9 LV SV:         103 LV SV Index:   51 LVOT  Area:     4.15 cm        3D Volume EF                                LV 3D EF:    Left                                              ventricular                                             ejection                                             fraction by                                             3D volume                                             is 56 %.                                 3D Volume EF:                                3D EF:        56 %  PULMONARY VEINS A Reversal Duration: 116.00 msec A Reversal Velocity: 85.92 cm/s Diastolic Velocity:  92.44 cm/s S/D Velocity:        6.28 Systolic Velocity:   63.81 cm/s LEFT ATRIUM           Index       RIGHT ATRIUM           Index LA diam:      4.70 cm 2.34 cm/m  RA Area:     13.00 cm LA Vol (A2C): 45.5 ml 22.66 ml/m RA Volume:   27.40 ml  13.65 ml/m  AORTIC VALVE                    PULMONIC VALVE AV Area (Vmax):    3.73 cm     PV Vmax:       1.09 m/s AV Area (Vmean):   3.89 cm     PV Vmean:      84.200 cm/s AV Area (VTI):     3.63 cm     PV VTI:        0.198 m AV Vmax:           168.00 cm/s  PV Peak grad:  4.7 mmHg AV Vmean:          109.000 cm/s PV Mean grad:  3.0 mmHg AV VTI:  0.284 m AV Peak Grad:      11.3 mmHg AV Mean Grad:      6.0 mmHg LVOT Vmax:         151.00 cm/s LVOT Vmean:        102.000 cm/s LVOT VTI:          0.248 m LVOT/AV VTI ratio: 0.87  AORTA Ao Root diam: 4.00 cm MITRAL VALVE MV Area (PHT): 5.23 cm     SHUNTS MV Decel Time: 145 msec     Systemic VTI:  0.25 m MR Peak grad: 111.9 mmHg    Systemic Diam: 2.30 cm MR Mean grad: 76.0 mmHg MR Vmax:      529.00 cm/s MR Vmean:     417.0 cm/s MV E velocity: 138.00 cm/s MV A velocity: 70.40 cm/s MV E/A ratio:  1.96 Cherlynn Kaiser MD Electronically signed by Cherlynn Kaiser MD Signature Date/Time: 09/22/2020/4:28:35 PM    Final     Scheduled Meds: . Cenobamate  100 mg Oral Daily  . insulin aspart  0-6 Units Subcutaneous Q4H  . lamoTRIgine  400 mg Oral BID  . levETIRAcetam  1,125 mg Oral BID  . pantoprazole  40 mg Oral Daily  . peg 3350 powder  0.5 kit Oral Once   And  . [START ON 09/24/2020] peg 3350 powder   0.5 kit Oral Once  . PHENobarbital  64.8 mg Oral BID    Continuous Infusions: . sodium chloride    . sodium chloride       LOS: 2 days     Kayleen Memos, MD [facility] Pager [phone]  If 7PM-7AM, please contact night-coverage [email or web address] Password [facility] 09/23/2020, 1:09 PM

## 2020-09-24 ENCOUNTER — Encounter (HOSPITAL_COMMUNITY): Payer: Self-pay | Admitting: Family Medicine

## 2020-09-24 ENCOUNTER — Encounter (HOSPITAL_COMMUNITY)
Admission: EM | Disposition: A | Discharge status: Home Health | Payer: Self-pay | Source: Home / Self Care | Attending: Internal Medicine

## 2020-09-24 ENCOUNTER — Inpatient Hospital Stay (HOSPITAL_COMMUNITY): Payer: Medicare HMO | Admitting: Certified Registered Nurse Anesthetist

## 2020-09-24 ENCOUNTER — Inpatient Hospital Stay (HOSPITAL_COMMUNITY): Payer: Medicare HMO

## 2020-09-24 DIAGNOSIS — R778 Other specified abnormalities of plasma proteins: Secondary | ICD-10-CM | POA: Diagnosis not present

## 2020-09-24 DIAGNOSIS — R931 Abnormal findings on diagnostic imaging of heart and coronary circulation: Secondary | ICD-10-CM

## 2020-09-24 DIAGNOSIS — E7800 Pure hypercholesterolemia, unspecified: Secondary | ICD-10-CM

## 2020-09-24 DIAGNOSIS — D649 Anemia, unspecified: Secondary | ICD-10-CM | POA: Diagnosis not present

## 2020-09-24 HISTORY — PX: POLYPECTOMY: SHX5525

## 2020-09-24 HISTORY — PX: BIOPSY: SHX5522

## 2020-09-24 HISTORY — PX: COLONOSCOPY WITH PROPOFOL: SHX5780

## 2020-09-24 HISTORY — PX: ESOPHAGEAL BRUSHING: SHX6842

## 2020-09-24 HISTORY — PX: ESOPHAGOGASTRODUODENOSCOPY (EGD) WITH PROPOFOL: SHX5813

## 2020-09-24 LAB — CBC
HCT: 30.9 % — ABNORMAL LOW (ref 39.0–52.0)
Hemoglobin: 8.6 g/dL — ABNORMAL LOW (ref 13.0–17.0)
MCH: 17.2 pg — ABNORMAL LOW (ref 26.0–34.0)
MCHC: 27.8 g/dL — ABNORMAL LOW (ref 30.0–36.0)
MCV: 61.9 fL — ABNORMAL LOW (ref 80.0–100.0)
Platelets: 535 10*3/uL — ABNORMAL HIGH (ref 150–400)
RBC: 4.99 MIL/uL (ref 4.22–5.81)
RDW: 28.5 % — ABNORMAL HIGH (ref 11.5–15.5)
WBC: 11.6 10*3/uL — ABNORMAL HIGH (ref 4.0–10.5)
nRBC: 0 % (ref 0.0–0.2)

## 2020-09-24 LAB — BASIC METABOLIC PANEL
Anion gap: 10 (ref 5–15)
BUN: 9 mg/dL (ref 6–20)
CO2: 25 mmol/L (ref 22–32)
Calcium: 9.5 mg/dL (ref 8.9–10.3)
Chloride: 102 mmol/L (ref 98–111)
Creatinine, Ser: 0.87 mg/dL (ref 0.61–1.24)
GFR, Estimated: >60 mL/min (ref >60)
Glucose, Bld: 155 mg/dL — ABNORMAL HIGH (ref 70–99)
Potassium: 3.8 mmol/L (ref 3.5–5.1)
Sodium: 137 mmol/L (ref 135–145)

## 2020-09-24 LAB — GLUCOSE, CAPILLARY
Glucose-Capillary: 163 mg/dL — ABNORMAL HIGH (ref 70–99)
Glucose-Capillary: 126 mg/dL — ABNORMAL HIGH (ref 70–99)
Glucose-Capillary: 145 mg/dL — ABNORMAL HIGH (ref 70–99)
Glucose-Capillary: 110 mg/dL — ABNORMAL HIGH (ref 70–99)
Glucose-Capillary: 224 mg/dL — ABNORMAL HIGH (ref 70–99)
Glucose-Capillary: 153 mg/dL — ABNORMAL HIGH (ref 70–99)

## 2020-09-24 LAB — LIPID PANEL
Cholesterol: 221 mg/dL — ABNORMAL HIGH (ref 0–200)
HDL: 39 mg/dL — ABNORMAL LOW (ref >40)
LDL Cholesterol: 166 mg/dL — ABNORMAL HIGH (ref 0–99)
Total CHOL/HDL Ratio: 5.7 RATIO
Triglycerides: 79 mg/dL (ref <150)
VLDL: 16 mg/dL (ref 0–40)

## 2020-09-24 SURGERY — ESOPHAGOGASTRODUODENOSCOPY (EGD) WITH PROPOFOL
Anesthesia: Monitor Anesthesia Care

## 2020-09-24 MED ORDER — LACTATED RINGERS IV SOLN: INTRAVENOUS | Status: DC | PRN

## 2020-09-24 MED ORDER — PHENYLEPHRINE HCL (PRESSORS) 10 MG/ML IV SOLN
INTRAVENOUS | Status: DC | PRN
  Administered 2020-09-24 (×2): 80 ug via INTRAVENOUS
  Administered 2020-09-24: 120 ug via INTRAVENOUS

## 2020-09-24 MED ORDER — IOHEXOL 9 MG/ML PO SOLN
ORAL | Status: AC
  Administered 2020-09-24: 500 mL
  Filled 2020-09-24: qty 1000

## 2020-09-24 MED ORDER — EPHEDRINE SULFATE 50 MG/ML IJ SOLN
INTRAMUSCULAR | Status: DC | PRN
  Administered 2020-09-24 (×2): 10 mg via INTRAVENOUS

## 2020-09-24 MED ORDER — PROPOFOL 10 MG/ML IV BOLUS
INTRAVENOUS | Status: DC | PRN
  Administered 2020-09-24: 40 mg via INTRAVENOUS

## 2020-09-24 MED ORDER — METOPROLOL TARTRATE 100 MG PO TABS: 100.0000 mg | ORAL_TABLET | Freq: Once | ORAL | Status: DC

## 2020-09-24 MED ORDER — LORAZEPAM 1 MG PO TABS
1.0000 mg | ORAL_TABLET | Freq: Two times a day (BID) | ORAL | Status: DC
  Administered 2020-09-24: 1 mg via ORAL
  Filled 2020-09-24 (×2): qty 1

## 2020-09-24 MED ORDER — METOPROLOL TARTRATE 100 MG PO TABS
100.0000 mg | ORAL_TABLET | Freq: Once | ORAL | Status: AC
  Administered 2020-09-25: 100 mg via ORAL
  Filled 2020-09-24: qty 1

## 2020-09-24 MED ORDER — LIDOCAINE 2% (20 MG/ML) 5 ML SYRINGE
INTRAMUSCULAR | Status: DC | PRN
  Administered 2020-09-24: 40 mg via INTRAVENOUS

## 2020-09-24 MED ORDER — SODIUM CHLORIDE 0.9 % IV SOLN: INTRAVENOUS | Status: DC

## 2020-09-24 MED ORDER — LACTATED RINGERS IV SOLN
INTRAVENOUS | Status: AC | PRN
  Administered 2020-09-24: 1000 mL via INTRAVENOUS

## 2020-09-24 MED ORDER — PROPOFOL 500 MG/50ML IV EMUL
INTRAVENOUS | Status: DC | PRN
  Administered 2020-09-24: 100 ug/kg/min via INTRAVENOUS

## 2020-09-24 MED ORDER — IOHEXOL 300 MG/ML  SOLN
100.0000 mL | Freq: Once | INTRAMUSCULAR | Status: AC | PRN
  Administered 2020-09-24: 100 mL via INTRAVENOUS

## 2020-09-24 MED ORDER — FLUCONAZOLE 100 MG PO TABS
100.0000 mg | ORAL_TABLET | Freq: Every day | ORAL | Status: DC
  Administered 2020-09-24 – 2020-09-28 (×5): 100 mg via ORAL
  Filled 2020-09-24 (×5): qty 1

## 2020-09-24 SURGICAL SUPPLY — 25 items

## 2020-09-24 NOTE — Anesthesia Postprocedure Evaluation (Signed)
Anesthesia Post Note  Patient: Jeff Kelley  Procedure(s) Performed: ESOPHAGOGASTRODUODENOSCOPY (EGD) WITH PROPOFOL (N/A ) COLONOSCOPY WITH PROPOFOL (N/A ) BIOPSY POLYPECTOMY ESOPHAGEAL BRUSHING     Patient location during evaluation: PACU Anesthesia Type: MAC Level of consciousness: awake and alert and oriented Pain management: pain level controlled Vital Signs Assessment: post-procedure vital signs reviewed and stable Respiratory status: spontaneous breathing, nonlabored ventilation and respiratory function stable Cardiovascular status: blood pressure returned to baseline Postop Assessment: no apparent nausea or vomiting Anesthetic complications: no   No complications documented.  Last Vitals:  Vitals:   09/24/20 1537 09/24/20 1547  BP: 133/79 132/79  Pulse: 94 91  Resp: 16 (!) 23  Temp:    SpO2: 94% 94%    Last Pain:  Vitals:   09/24/20 1508  TempSrc: Temporal  PainSc:                  Brennan Bailey

## 2020-09-24 NOTE — Progress Notes (Signed)
Notified MD of ST elevation in V lead on telemetry monitor.

## 2020-09-24 NOTE — Consult Note (Addendum)
Jeff Kelley [birth year]  [id number].    Requesting MD: Dr. Collene Mares Chief Complaint/Reason for Consult: Colon Mass  HPI: Jeff Kelley is a 50 y.o. male with a history of seizure disorder and anxiety who presented to the ED with SOB with exertion and rest x 1.5 months, fatigue and a 30lb weight loss over the last year. States that his symptoms have progressively gotten worse, therefore he decided to come to the ED.  During workup patient was found to have elevated Tn's that cardiology was felt due to demand ischemia; hgb of 6.1 with + FOBT. He denies abdominal pain, gross hematochezia, melena or changes to his bowel movements. He underwent endoscopy and colonoscopy by Dr. Collene Mares on 3/14 that showed impressions as noted below. We were asked to see.   Today he states that he overall feels somewhat better. Still weak but SOB improved. Denies abdominal pain, nausea, vomiting. Tolerated bowel preop prior to colonoscopy and continues to have some loose stool. He ate a few bites of grits yesterday, otherwise has only had clear liquids.  He is not on any blood thinners.  Nonsmoker Denies alcohol or illicit drug use  Abdominal surgical history: VP shunt placement No family h/o colon cancer Lives at home with his girlfriend On disability due to his seizures  Colonoscopy  - One 6 mm sessile polyp in the sigmoid colon, removed with a cold snare x 1; resected and retrieved. - One 10 mm sessile polyp at the hepatic flexure, removed with a hot snare x 1; resected and retrieved. 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  Endoscopy  - Esophageal plaques were found, suspicious for candidiasis; cells for cytology obtained. - Normal appearing stomach. - Normal examined duodenum.  ROS:  Review of Systems  Constitutional: Positive for malaise/fatigue and weight loss.  Respiratory: Positive for shortness of breath.   Cardiovascular: Negative.    Gastrointestinal: Positive for blood in stool (fobt +). Negative for melena.  Genitourinary: Negative.   Musculoskeletal: Negative.   Neurological: Positive for seizures.  All other systems reviewed and are negative.   Family History  Problem Relation Age of Onset  . Heart attack Mother   . Diabetes Mother   . Lung cancer Father     Past Medical History:  Diagnosis Date  . Seizures ([place])     History reviewed. No pertinent surgical history.  Social History:  reports that he has quit smoking. He has never used smokeless tobacco. He reports previous alcohol use. He reports previous drug use.  Allergies:  Allergies  Allergen Reactions  . Codeine Other (See Comments)    Patient states he was told he was allergic when he was [age] years old, unknown reaction.  Marland Kitchen Penicillins Other (See Comments)    Patient states he was told he was allergic when he was [age] years ago, unknown reaction    Medications Prior to Admission  Medication Sig Dispense Refill  . acetaminophen (TYLENOL) 500 MG tablet Take 1,000 mg by mouth every 6 (six) hours as needed for headache (pain).    . Cenobamate 14 x 50 MG & 14 x100 MG TBPK Take 50-100 mg by mouth See admin instructions. Take one 50 mg tablet daily for 2 weeks, then take one 100 mg tablet daily for 2 weeks, then fill 3rd month's pack    . Cholecalciferol (VITAMIN D3) 125 MCG (5000 UT) CAPS Take 5,000 Units by mouth daily.    Marland Kitchen esomeprazole (  NEXIUM) 20 MG capsule Take 20 mg by mouth daily at 12 noon.    . lamoTRIgine (LAMICTAL) 200 MG tablet Take 400 mg by mouth 2 (two) times daily.    Marland Kitchen levETIRAcetam (KEPPRA) 750 MG tablet Take 1,500 mg by mouth See admin instructions. Previous dose 2 tablets (1500 mg) by mouth twice daily - reduce dose starting 09/21/2020 - take 1 1/2 tablets (1125 mg) by mouth twice daily for 3-4 weeks then take 1 tablet (750 mg) twice daily, then consult MD for further instructions.    Marland Kitchen LORazepam (ATIVAN) 1 MG tablet Take 1 mg  by mouth 2 (two) times daily.    . Multiple Vitamin (MULTIVITAMIN WITH MINERALS) TABS tablet Take 1 tablet by mouth daily.    Marland Kitchen PHENobarbital (LUMINAL) 64.8 MG tablet Take 64.8 mg by mouth 2 (two) times daily.       Physical Exam: Blood pressure 104/60, pulse (!) 101, temperature 99.1 F (37.3 C), temperature source Temporal, resp. rate (!) 21, height 5\' 6"  (1.676 m), weight 86.9 kg, SpO2 97 %. General: pleasant, WD/WN male who is laying in bed in NAD HEENT: head is normocephalic, atraumatic.  Sclera are noninjected.  PERRL.  Ears and nose without any masses or lesions.  Mouth is pink and moist. Dentition fair Heart: regular, rate, and rhythm.  Normal s1,s2. No obvious murmurs, gallops, or rubs noted.  Palpable pedal pulses bilaterally  Lungs: CTAB, no wheezes, rhonchi, or rales noted.  Respiratory effort nonlabored Abd: well healed RLQ incision, soft, NT/ND, +BS, no masses, hernias, or organomegaly MS: no BUE/BLE edema, calves soft and nontender Skin: warm and dry with no masses, lesions, or rashes Psych: A&Ox4 with an appropriate affect Neuro: cranial nerves grossly intact, equal strength in BUE/BLE bilaterally, normal speech, gait not assessed, thought process intact  Results for orders placed or performed during the hospital encounter of 09/21/20 (from the past 48 hour(s))  Glucose, capillary     Status: Abnormal   Collection Time: 09/22/20  4:17 PM  Result Value Ref Range   Glucose-Capillary 141 (H) 70 - 99 mg/dL    Comment: Glucose reference range applies only to samples taken after fasting for at least 8 hours.  CBC     Status: Abnormal   Collection Time: 09/22/20  4:37 PM  Result Value Ref Range   WBC 16.9 (H) 4.0 - 10.5 K/uL   RBC 5.21 4.22 - 5.81 MIL/uL   Hemoglobin 8.9 (L) 13.0 - 17.0 g/dL    Comment: Reticulocyte Hemoglobin testing may be clinically indicated, consider ordering this additional test [id number]    HCT 31.4 (L) 39.0 - 52.0 %   MCV 60.3 (L) 80.0 - 100.0 fL    MCH 17.1 (L) 26.0 - 34.0 pg   MCHC 28.3 (L) 30.0 - 36.0 g/dL   RDW 27.7 (H) 11.5 - 15.5 %   Platelets 548 (H) 150 - 400 K/uL    Comment: REPEATED TO VERIFY   nRBC 0.2 0.0 - 0.2 %    Comment: Performed at [facility], [place] [address] [place], [place] [address]  Glucose, capillary     Status: Abnormal   Collection Time: 09/22/20  7:40 PM  Result Value Ref Range   Glucose-Capillary 215 (H) 70 - 99 mg/dL    Comment: Glucose reference range applies only to samples taken after fasting for at least 8 hours.  Glucose, capillary     Status: Abnormal   Collection Time: 09/22/20 11:40 PM  Result Value Ref Range  Glucose-Capillary 184 (H) 70 - 99 mg/dL    Comment: Glucose reference range applies only to samples taken after fasting for at least 8 hours.  Glucose, capillary     Status: Abnormal   Collection Time: 09/23/20  3:27 AM  Result Value Ref Range   Glucose-Capillary 172 (H) 70 - 99 mg/dL    Comment: Glucose reference range applies only to samples taken after fasting for at least 8 hours.  Basic metabolic panel     Status: Abnormal   Collection Time: 09/23/20  4:57 AM  Result Value Ref Range   Sodium 130 (L) 135 - 145 mmol/L   Potassium 3.5 3.5 - 5.1 mmol/L   Chloride 98 98 - 111 mmol/L   CO2 25 22 - 32 mmol/L   Glucose, Bld 176 (H) 70 - 99 mg/dL    Comment: Glucose reference range applies only to samples taken after fasting for at least 8 hours.   BUN 11 6 - 20 mg/dL   Creatinine, Ser 0.81 0.61 - 1.24 mg/dL   Calcium 8.6 (L) 8.9 - 10.3 mg/dL   GFR, Estimated >60 >60 mL/min    Comment: (NOTE) Calculated using the CKD-EPI Creatinine Equation (2021)    Anion gap 7 5 - 15    Comment: Performed at [facility] [address], [place], [place] [address]  CBC     Status: Abnormal   Collection Time: 09/23/20  4:57 AM  Result Value Ref Range   WBC 13.2 (H) 4.0 - 10.5 K/uL   RBC 4.51 4.22 - 5.81 MIL/uL   Hemoglobin 7.7 (L) 13.0 - 17.0 g/dL    Comment: Reticulocyte  Hemoglobin testing may be clinically indicated, consider ordering this additional test [id number]    HCT 27.5 (L) 39.0 - 52.0 %   MCV 61.0 (L) 80.0 - 100.0 fL   MCH 17.1 (L) 26.0 - 34.0 pg   MCHC 28.0 (L) 30.0 - 36.0 g/dL   RDW 27.1 (H) 11.5 - 15.5 %   Platelets 482 (H) 150 - 400 K/uL   nRBC 0.2 0.0 - 0.2 %    Comment: Performed at [facility], [place] [address], [place], [place] [address]  Iron and TIBC     Status: Abnormal   Collection Time: 09/23/20  4:57 AM  Result Value Ref Range   Iron 16 (L) 45 - 182 ug/dL   TIBC 462 (H) 250 - 450 ug/dL   Saturation Ratios 3 (L) 17.9 - 39.5 %   UIBC 446 ug/dL    Comment: Performed at [facility], [place] [address], [place], [place] [address]  Ferritin     Status: Abnormal   Collection Time: 09/23/20  4:57 AM  Result Value Ref Range   Ferritin 8 (L) 24 - 336 ng/mL    Comment: Performed at [facility], [place] [address], [place], [place] [address]  Vitamin B12     Status: None   Collection Time: 09/23/20  4:57 AM  Result Value Ref Range   Vitamin B-12 670 180 - 914 pg/mL    Comment: (NOTE) This assay is not validated for testing neonatal or myeloproliferative syndrome specimens for Vitamin B12 levels. Performed at [facility], [place] [address], [place], [place] [address]   Folate, serum, performed at [facility] lab     Status: None   Collection Time: 09/23/20  4:57 AM  Result Value Ref Range   Folate 19.6 >5.9 ng/mL    Comment:  Performed at [facility], [place] [address] [place], [place] [address]  Glucose, capillary     Status: Abnormal   Collection Time: 09/23/20  8:04 AM  Result Value Ref Range   Glucose-Capillary 149 (H) 70 - 99 mg/dL    Comment: Glucose reference range applies only to samples taken after fasting for at least 8 hours.  Glucose, capillary     Status: Abnormal   Collection Time: 09/23/20 12:16 PM  Result Value Ref Range   Glucose-Capillary 171 (H) 70 - 99 mg/dL    Comment: Glucose  reference range applies only to samples taken after fasting for at least 8 hours.  Glucose, capillary     Status: Abnormal   Collection Time: 09/23/20  3:58 PM  Result Value Ref Range   Glucose-Capillary 184 (H) 70 - 99 mg/dL    Comment: Glucose reference range applies only to samples taken after fasting for at least 8 hours.  CBC     Status: Abnormal   Collection Time: 09/23/20  4:02 PM  Result Value Ref Range   WBC 12.4 (H) 4.0 - 10.5 K/uL   RBC 4.84 4.22 - 5.81 MIL/uL   Hemoglobin 8.3 (L) 13.0 - 17.0 g/dL    Comment: Reticulocyte Hemoglobin testing may be clinically indicated, consider ordering this additional test [id number]    HCT 30.0 (L) 39.0 - 52.0 %   MCV 62.0 (L) 80.0 - 100.0 fL   MCH 17.1 (L) 26.0 - 34.0 pg   MCHC 27.7 (L) 30.0 - 36.0 g/dL   RDW 27.8 (H) 11.5 - 15.5 %   Platelets 477 (H) 150 - 400 K/uL    Comment: REPEATED TO VERIFY   nRBC 0.0 0.0 - 0.2 %    Comment: Performed at [facility], [place] [address] [place], [place] [address]  TSH     Status: None   Collection Time: 09/23/20  4:02 PM  Result Value Ref Range   TSH 2.441 0.350 - 4.500 uIU/mL    Comment: Performed by a 3rd Generation assay with a functional sensitivity of <=0.01 uIU/mL. Performed at [facility], [place] [address] [place], [place] [address]   Glucose, capillary     Status: Abnormal   Collection Time: 09/23/20  7:49 PM  Result Value Ref Range   Glucose-Capillary 123 (H) 70 - 99 mg/dL    Comment: Glucose reference range applies only to samples taken after fasting for at least 8 hours.  Glucose, capillary     Status: Abnormal   Collection Time: 09/23/20 11:44 PM  Result Value Ref Range   Glucose-Capillary 115 (H) 70 - 99 mg/dL    Comment: Glucose reference range applies only to samples taken after fasting for at least 8 hours.  Basic metabolic panel     Status: Abnormal   Collection Time: 09/24/20  3:23 AM  Result Value Ref Range   Sodium 137 135 - 145 mmol/L   Potassium 3.8 3.5 -  5.1 mmol/L   Chloride 102 98 - 111 mmol/L   CO2 25 22 - 32 mmol/L   Glucose, Bld 155 (H) 70 - 99 mg/dL    Comment: Glucose reference range applies only to samples taken after fasting for at least 8 hours.   BUN 9 6 - 20 mg/dL   Creatinine, Ser 0.87 0.61 - 1.24 mg/dL   Calcium 9.5 8.9 - 10.3 mg/dL   GFR, Estimated >60 >60 mL/min    Comment: (NOTE) Calculated using the CKD-EPI Creatinine  Equation (2021)    Anion gap 10 5 - 15    Comment: Performed at [facility], [place] [address] [place], [place] [address]  CBC     Status: Abnormal   Collection Time: 09/24/20  3:23 AM  Result Value Ref Range   WBC 11.6 (H) 4.0 - 10.5 K/uL   RBC 4.99 4.22 - 5.81 MIL/uL   Hemoglobin 8.6 (L) 13.0 - 17.0 g/dL    Comment: Reticulocyte Hemoglobin testing may be clinically indicated, consider ordering this additional test [id number]    HCT 30.9 (L) 39.0 - 52.0 %   MCV 61.9 (L) 80.0 - 100.0 fL   MCH 17.2 (L) 26.0 - 34.0 pg   MCHC 27.8 (L) 30.0 - 36.0 g/dL   RDW 28.5 (H) 11.5 - 15.5 %   Platelets 535 (H) 150 - 400 K/uL   nRBC 0.0 0.0 - 0.2 %    Comment: Performed at [facility], [place] [address] [place], [place] [address]  Lipid panel     Status: Abnormal   Collection Time: 09/24/20  3:23 AM  Result Value Ref Range   Cholesterol 221 (H) 0 - 200 mg/dL   Triglycerides 79 <150 mg/dL   HDL 39 (L) >40 mg/dL   Total CHOL/HDL Ratio 5.7 RATIO   VLDL 16 0 - 40 mg/dL   LDL Cholesterol 166 (H) 0 - 99 mg/dL    Comment:        Total Cholesterol/HDL:CHD Risk Coronary Heart Disease Risk Table                     Men   Women  1/2 Average Risk   3.4   3.3  Average Risk       5.0   4.4  2 X Average Risk   9.6   7.1  3 X Average Risk  23.4   11.0        Use the calculated Patient Ratio above and the CHD Risk Table to determine the patient's CHD Risk.        ATP III CLASSIFICATION (LDL):  <100     mg/dL   Optimal  100-129  mg/dL   Near or Above                    Optimal  130-159  mg/dL    Borderline  160-189  mg/dL   High  >190     mg/dL   Very High Performed at [facility] [address] [place], [place] [address]   Glucose, capillary     Status: Abnormal   Collection Time: 09/24/20  3:50 AM  Result Value Ref Range   Glucose-Capillary 163 (H) 70 - 99 mg/dL    Comment: Glucose reference range applies only to samples taken after fasting for at least 8 hours.  Glucose, capillary     Status: Abnormal   Collection Time: 09/24/20  8:05 AM  Result Value Ref Range   Glucose-Capillary 126 (H) 70 - 99 mg/dL    Comment: Glucose reference range applies only to samples taken after fasting for at least 8 hours.  Glucose, capillary     Status: Abnormal   Collection Time: 09/24/20 12:37 PM  Result Value Ref Range   Glucose-Capillary 145 (H) 70 - 99 mg/dL    Comment: Glucose reference range applies only to samples taken after fasting for at least 8 hours.   No results found.   Anti-infectives (From  admission, onward)   Start     Dose/Rate Route Frequency Ordered Stop   09/24/20 1600  fluconazole (DIFLUCAN) tablet 100 mg        100 mg Oral Daily 09/24/20 1552 10/01/20 0959       Assessment/Plan Hx of Seizures HLD DM - Hbg A1c 9.6 Elevated Tn on admission - felt to be demand ischemia by Cardiology  Esophageal plaques - noted on endoscopy, brushing taken by Dr. Collene Mares ABL anemia - likely 2/2 GI loss. hgb 6.1 on admission, 7.5 today from 8.6 Moderate malnutrition - Prealbumin 16.2 4.4 cm ascending thoracic aortic aneurysm  Colon Mass - Endoscopy showed a large malignant tumor in the proximal ascending colon close to the ICV that was biopsied. This also showed a 6 mm sessile polyp in the sigmoid colon and a 10 mm sessile polyp at the hepatic flexure that were retrieved with snare.  - Await pathology of bx's  - CT CAP ordered and showed 5.0 x 1.4 x 3.7 cm eccentric intraluminal and transmural proximal colonic mass highly concerning for colon cancer, no evidence of  obstruction; multiple subcentimeter lymph nodes within the right lower quadrant mesentery - CEA 12.4 - Do not advance past clear liquids - Pending pathology and patient does not need more extensive surgery, will plan for R colon this week. Possibly as early as tomorrow - we would like to know pathology of polyps that were removed for surgical planning.   FEN - CLD, add Boost breeze VTE - SCDs ID - [email or web address] 3/14>>  Wellington [place], [facility] 09/24/2020, 3:26 PM Please see Amion for pager number during day hours 7:00am-4:30pm

## 2020-09-24 NOTE — Plan of Care (Signed)
  Problem: Clinical Measurements: Goal: Diagnostic test results will improve Outcome: Progressing Goal: Respiratory complications will improve Outcome: Progressing   Problem: Activity: Goal: Risk for activity intolerance will decrease Outcome: Progressing   Problem: Nutrition: Goal: Adequate nutrition will be maintained Outcome: Not Progressing Note: Pt is NPO for colonoscopy. Depending on results, will determine if patient can have diet this evening

## 2020-09-24 NOTE — Anesthesia Preprocedure Evaluation (Addendum)
Anesthesia Evaluation  Patient identified by MRN, date of birth, ID band Patient awake    Reviewed: Allergy & Precautions, NPO status , Patient's Chart, lab work & pertinent test results  History of Anesthesia Complications Negative for: history of anesthetic complications  Airway Mallampati: III  TM Distance: >3 FB Neck ROM: Full    Dental  (+) Missing,    Pulmonary former smoker,    Pulmonary exam normal        Cardiovascular negative cardio ROS Normal cardiovascular exam  TTE 09/22/20: EF 56%, grade II diastolic dysfunction, mild dilatation of the aortic root, measuring 40 mm    Neuro/Psych Seizures -, Well Controlled,  negative psych ROS   GI/Hepatic Neg liver ROS, GERD  Medicated and Controlled,Hemoccult positive stools   Endo/Other  negative endocrine ROS  Renal/GU negative Renal ROS  negative genitourinary   Musculoskeletal negative musculoskeletal ROS (+)   Abdominal   Peds  Hematology  (+) Blood dyscrasia, anemia , Hgb 8.6   Anesthesia Other Findings   Reproductive/Obstetrics                           Anesthesia Physical Anesthesia Plan  ASA: III  Anesthesia Plan: MAC   Post-op Pain Management:    Induction: Intravenous  PONV Risk Score and Plan: Propofol infusion and Treatment may vary due to age or medical condition  Airway Management Planned: Natural Airway and Nasal Cannula  Additional Equipment: None  Intra-op Plan:   Post-operative Plan:   Informed Consent: I have reviewed the patients History and Physical, chart, labs and discussed the procedure including the risks, benefits and alternatives for the proposed anesthesia with the patient or authorized representative who has indicated his/her understanding and acceptance.       Plan Discussed with: CRNA  Anesthesia Plan Comments:      Anesthesia Quick Evaluation

## 2020-09-24 NOTE — Op Note (Addendum)
[facility] Patient Name: Jeff Kelley Procedure Date : 09/24/2020 MRN: [id number] Attending MD: Juanita Craver , MD Date of Birth: Feb 28, 1971 CSN: [id number] Age: 50 Admit Type: Inpatient Procedure:                EGD with esophageal brushings. Indications:              Iron deficiency anemia, Heme positive stool,                            Abnormal weight loss-30 lbs in 1 year. Providers:                Juanita Craver, MD, Kary Kos RN, RN, Elspeth Cho Tech., Technician, Vickii Penna, CRNA Referring MD:             [facility]. Medicines:                Monitored Anesthesia Care Complications:            No immediate complications. Estimated Blood Loss:     Estimated blood loss: none. Procedure:                Pre-Anesthesia Assessment: - Prior to the                            procedure, a history and physical was performed,                            and patient medications and allergies were                            reviewed. The patient's tolerance of previous                            anesthesia was also reviewed. The risks and                            benefits of the procedure and the sedation options                            and risks were discussed with the patient. All                            questions were answered, and informed consent was                            obtained. Prior Anticoagulants: The patient has                            taken no previous anticoagulant or antiplatelet                            agents. ASA Grade Assessment: III - A patient with  severe systemic disease. After reviewing the risks                            and benefits, the patient was deemed in                            satisfactory condition to undergo the procedure.                            After obtaining informed consent, the endoscope was                            passed under direct vision.  Throughout the                            procedure, the patient's blood pressure, pulse, and                            oxygen saturations were monitored continuously. The                            GIF-H190 ([id number] Olympus gastroscope was                            introduced through the mouth, and advanced to the                            second part of duodenum. The EGD was accomplished                            without difficulty. The patient tolerated the                            procedure well. Scope In: Scope Out: Findings:      Diffuse, white plaques were found in the entire esophagus-cells for       cytology were obtained by brushing.      The entire examined stomach was normal.      The cardia and gastric fundus were normal on retroflexion.      The examined duodenum was normal. Impression:               - Esophageal plaques were found, suspicious for                            candidiasis; cells for cytology obtained.                           - Normal appearing stomach.                           - Normal examined duodenum. Moderate Sedation:      MAC used. Recommendation:           - Full liquid diet today.                           -  Continue present medications.                           - Diflucan (Fluconazole) 100 mg PO daily for 1 week. Procedure Code(s):        --- Professional ---                           [phone], Esophagogastroduodenoscopy, flexible,                            transoral; diagnostic, including collection of                            specimen(s) by brushing or washing, when performed                            (separate procedure) Diagnosis Code(s):        --- Professional ---                           D50.9, Iron deficiency anemia, unspecified                           R19.5, Other fecal abnormalities                           K22.9, Disease of esophagus, unspecified CPT copyright 2019 [employer]. All rights reserved. The  codes documented in this report are preliminary and upon coder review may  be revised to meet current compliance requirements. Juanita Craver, MD Juanita Craver, MD 09/24/2020 3:17:23 PM This report has been signed electronically. Number of Addenda: 0

## 2020-09-24 NOTE — Progress Notes (Signed)
PROGRESS NOTE  Jeff Kelley VPX:[id number] DOB: Dec 11, 1970 DOA: 09/21/2020 PCP: Patient, No Pcp Per  HPI/Recap of past 24 hours: Jeff Kelley is a 50 y.o. male with medical history significant for seizure disorder and anxiety, now presenting to the emergency department for evaluation of shortness of breath.  Patient began to notice shortness of breath with exertion approximately 1 month ago, slowly progressed to the point where he was dyspneic at rest. No significant cough, fevers, or chills.  No chest pain or leg swelling.  Shortness of breath was much worse last night when he was laying down and woke him from sleep in a panic but he notes a history of anxiety and panic and is unable to say whether his breathing is better when sitting up.  He denies any known history of heart disease.  ED Course: Upon arrival to the ED, patient is found to be afebrile, saturating well on room air, tachypneic, tachycardic, and with stable blood pressure.  EKG features sinus tachycardia and nonspecific ST-T abnormality.  Chest x-ray notable for prominent interstitial markings and small bilateral pleural effusions.  Chemistry panel notable for glucose 263 and sodium 129.  CBC features a leukocytosis 12,700, platelets 585,000, and hemoglobin 6.1 with MCV 56.3.  High-sensitivity troponin is 109 and BNP 297.  Fecal occult blood testing was positive.  RBC transfusion was ordered from the ED.  Post EGD/colonoscopy on 09/24/2020.  Discussed with GI Dr. Collene Mares findings concerning for esophageal candidiasis and a large mass in the colon.  CEA ordered and pending.  Will obtain CT chest/abdomen/pelvis with contrast for staging.  Started gentle IV fluid hydration to prevent contrast-induced nephropathy.  General surgery has been consulted by GI.  09/24/20: Patient was seen and examined this morning prior to his endoscopies.  He has no new complaints at the time.  Assessment/Plan: Principal Problem:   Symptomatic  anemia Active Problems:   Seizures (HCC)   Occult GI bleeding   Elevated troponin   CHF (congestive heart failure) (HCC)   Hyperglycemia   Iron deficiency anemia   Hyponatremia   Abnormal echocardiogram  Newly diagnosed colon mass seen on colonoscopy done on 09/24/2020 by Dr. Collene Mares Colonoscopy done on 09/24/2020 by Dr. Collene Mares, GI revealed: One 6 mm sessile polyp in the sigmoid colon, removed with a cold snare x 1; resected and retrieved. - One 10 mm sessile polyp at the hepatic flexure, removed with a hot snare x 1; resected and retrieved. 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 CEA pending.  Follow results. CT chest/abdomen/pelvis with contrast pending.  Follow results. Started gentle IV fluid hydration normal saline at 50 cc/h x 2 days to avoid contrast-induced nephropathy. General surgery consulted Appreciate specialists assistance.  Newly diagnosed esophageal candidiasis seen on EGD done on 09/24/2020 by Dr. Collene Mares Started p.o. Diflucan 100 mg daily x7 days.  Severe symptomatic iron deficiency anemia with positive FOBT likely secondary to chronic blood loss in the setting of large malignant colon mass. Presented with gradually worsening shortness of breath with exertion of approximately 1 month duration. On presentation, hemoglobin of 6.1K, MCV 56.3, positive FOBT, severe iron deficiency. 2 unit PRBCs transfused on 09/22/2020. Hemoglobin this morning 7.7, repeat H&H Transfuse hemoglobin less than 8.0.  Elevated troponin likely secondary to demand ischemia in the setting of severe anemia Presented with troponin of 109, peaked at 110. Denies any anginal symptoms at the time of this visit. Continue to monitor on telemetry. Follow results of  2D echo done on 09/30/2020.  Diastolic CHF/mid and distal anterior septum, apical anterior segments are hypokinetic. Presented with BNP 297 2D echo showing LVEF 56% left ventricle demonstrate regional  wall motion abnormalities, left ventricle diastolic parameters are consistent with grade 2 diastolic dysfunction. Cardiology consult for further assessment and management. Started on Lipitor 80 mg daily. Will need to be cleared by GI for aspirin.  Leukocytosis, likely reactive in the setting of severe anemia. WBC is downtrending 11.6 from 13 from 16 Nonseptic appearing Afebrile.  Resolved hypovolemic hyponatremia Presented with serum sodium of 129 Uptrending 130> 137.  Type 2 diabetes with hyperglycemia Hemoglobin A1c 9.6 on 09/30/2020 Continue insulin sliding scale. Continue to monitor CBGs  Acute CHF, unspecified at this time.  No prior history. Presented with progressive shortness of breath over 1 month Elevated BNP and presentation to 97 Chest x-ray findings concerning for CHF. Received 1 dose of Lasix 40 mg IV x1. 2D echo done on 09/30/2020 showed LVEF 56%, the left ventricle demonstrate regional wall motion abnormalities, grade 2 diastolic dysfunction. Strict I's and O's and daily weight Closely monitor volume status in the setting of IV fluid hydration.  Seizure disorder Continue home regimen. Seizures precautions.  Chronic anxiety Continue home regimen.     Code Status: Full code.  Family Communication: None at bedside  Disposition Plan: Likely will discharge to home once GI, general surgery, cardiology sign off.  Consultants:  GI  Cardiology  General surgery   Procedures:  2D echo on 09/22/2020.  EGD/colonoscopy on 09/24/2020 by Dr. Collene Mares.  Antimicrobials:  None.  DVT prophylaxis: SCDs.  Status is: Inpatient    Dispo:  Patient From: Home  Planned Disposition: Home  Anticipated discharge date: 09/25/2020 post EGD and colonoscopy, or when GI, general surgery, and cardiology sign off.  Medically stable for discharge: No, ongoing management of severe symptomatic anemia, esophageal candidiasis..          Objective: Vitals:   09/24/20  0353 09/24/20 1236 09/24/20 1330 09/24/20 1508  BP: 120/87 123/80 (!) 159/99 104/60  Pulse: 98 82 90 (!) 101  Resp: 19 17 (!) 22 (!) 21  Temp: 98 F (36.7 C) 97.8 F (36.6 C) 97.8 F (36.6 C) 99.1 F (37.3 C)  TempSrc: Oral  Oral Temporal  SpO2: 96% 100% 100% 97%  Weight:      Height:        Intake/Output Summary (Last 24 hours) at 09/24/2020 1544 Last data filed at 09/24/2020 1509 Gross per 24 hour  Intake 1100 ml  Output --  Net 1100 ml   Filed Weights   09/22/20 0321 09/23/20 0830  Weight: 91.6 kg 86.9 kg    Exam:  . General: 50 y.o. year-old male well-developed well-nourished no acute stress.  Alert oriented x3. . Cardiovascular: Regular rate and rhythm no rubs or gallops. Marland Kitchen Respiratory: Clear to auscultation no wheezes or rales.   . Abdomen: Obese nontender no bowel sounds present. . Musculoskeletal: No lower extremity edema bilaterally.   . Skin: No ulcerative lesions noted. Marland Kitchen Psychiatry: Mood is appropriate for condition and setting.   Data Reviewed: CBC: Recent Labs  Lab 09/22/20 0436 09/22/20 1637 09/23/20 0457 09/23/20 1602 09/24/20 0323  WBC 15.0* 16.9* 13.2* 12.4* 11.6*  HGB 8.6* 8.9* 7.7* 8.3* 8.6*  HCT 30.2* 31.4* 27.5* 30.0* 30.9*  MCV 60.8* 60.3* 61.0* 62.0* 61.9*  PLT 520* 548* 482* 477* 782*   Basic Metabolic Panel: Recent Labs  Lab 09/21/20 1654 09/23/20 0457 09/24/20 0323  NA  129* 130* 137  K 3.6 3.5 3.8  CL 95* 98 102  CO2 23 25 25   GLUCOSE 263* 176* 155*  BUN 9 11 9   CREATININE 0.70 0.81 0.87  CALCIUM 8.7* 8.6* 9.5   GFR: Estimated Creatinine Clearance: 104.9 mL/min (by C-G formula based on SCr of 0.87 mg/dL). Liver Function Tests: No results for input(s): AST, ALT, ALKPHOS, BILITOT, PROT, ALBUMIN in the last 168 hours. No results for input(s): LIPASE, AMYLASE in the last 168 hours. No results for input(s): AMMONIA in the last 168 hours. Coagulation Profile: No results for input(s): INR, PROTIME in the last 168  hours. Cardiac Enzymes: No results for input(s): CKTOTAL, CKMB, CKMBINDEX, TROPONINI in the last 168 hours. BNP (last 3 results) No results for input(s): PROBNP in the last 8760 hours. HbA1C: Recent Labs    09/22/20 0019  HGBA1C 9.6*   CBG: Recent Labs  Lab 09/23/20 1949 09/23/20 2344 09/24/20 0350 09/24/20 0805 09/24/20 1237  GLUCAP 123* 115* 163* 126* 145*   Lipid Profile: Recent Labs    09/24/20 0323  CHOL 221*  HDL 39*  LDLCALC 166*  TRIG 79  CHOLHDL 5.7   Thyroid Function Tests: Recent Labs    09/23/20 1602  TSH 2.441   Anemia Panel: Recent Labs    09/21/20 2051 09/23/20 0457  VITAMINB12  --  670  FOLATE  --  19.6  FERRITIN 4* 8*  TIBC 494* 462*  IRON 11* 16*   Urine analysis: No results found for: COLORURINE, APPEARANCEUR, LABSPEC, PHURINE, GLUCOSEU, HGBUR, BILIRUBINUR, KETONESUR, PROTEINUR, UROBILINOGEN, NITRITE, LEUKOCYTESUR Sepsis Labs: @LABRCNTIP (procalcitonin:4,lacticidven:4)  ) Recent Results (from the past 240 hour(s))  SARS CORONAVIRUS 2 (TAT 6-24 HRS) Nasopharyngeal Nasopharyngeal Swab     Status: None   Collection Time: 09/21/20  8:45 PM   Specimen: Nasopharyngeal Swab  Result Value Ref Range Status   SARS Coronavirus 2 NEGATIVE NEGATIVE Final    Comment: (NOTE) SARS-CoV-2 target nucleic acids are NOT DETECTED.  The SARS-CoV-2 RNA is generally detectable in upper and lower respiratory specimens during the acute phase of infection. Negative results do not preclude SARS-CoV-2 infection, do not rule out co-infections with other pathogens, and should not be used as the sole basis for treatment or other patient management decisions. Negative results must be combined with clinical observations, patient history, and epidemiological information. The expected result is Negative.  Fact Sheet for Patients: SugarRoll.be  Fact Sheet for Healthcare Providers: https://www.woods-mathews.com/  This  test is not yet approved or cleared by the [place] FDA and  has been authorized for detection and/or diagnosis of SARS-CoV-2 by FDA under an Emergency Use Authorization (EUA). This EUA will remain  in effect (meaning this test can be used) for the duration of the COVID-19 declaration under Se ction 564(b)(1) of the Act, 21 U.S.C. section 360bbb-3(b)(1), unless the authorization is terminated or revoked sooner.  Performed at [facility], [place] [address] [place], [place] [address]   MRSA PCR Screening     Status: None   Collection Time: 09/22/20  4:25 AM   Specimen: Nasopharyngeal  Result Value Ref Range Status   MRSA by PCR NEGATIVE NEGATIVE Final    Comment:        The GeneXpert MRSA Assay (FDA approved for NASAL specimens only), is one component of a comprehensive MRSA colonization surveillance program. It is not intended to diagnose MRSA infection nor to guide or monitor treatment for MRSA infections. Performed at [facility], [place] [address],  [place], [place] [address]       Studies: No results found.  Scheduled Meds: . [MAR Hold] atorvastatin  80 mg Oral Daily  . [MAR Hold] Cenobamate  100 mg Oral Daily  . [MAR Hold] insulin aspart  0-6 Units Subcutaneous Q4H  . [MAR Hold] lamoTRIgine  400 mg Oral BID  . [MAR Hold] levETIRAcetam  1,125 mg Oral BID  . [START ON 09/25/2020] metoprolol tartrate  100 mg Oral Once  . [MAR Hold] pantoprazole  40 mg Oral Daily  . [MAR Hold] PHENobarbital  64.8 mg Oral BID    Continuous Infusions: . [MAR Hold] sodium chloride    . [MAR Hold] sodium chloride    . sodium chloride       LOS: 3 days     Kayleen Memos, MD [facility] Pager [phone]  If 7PM-7AM, please contact night-coverage [email or web address] Password [email or web address] 09/24/2020, 3:44 PM

## 2020-09-24 NOTE — Op Note (Signed)
[facility] Patient Name: Jeff Kelley Procedure Date : 09/24/2020 MRN: [id number] Attending MD: Juanita Craver , MD Date of Birth: Aug 30, 1970 CSN: [id number] Age: 50 Admit Type: Inpatient Procedure:                Colonoscopy with biopsies & snare polypectomy x 2. Indications:              Iron deficiency anemia; Blood in stool: CRC                            screening for colorectal malignant neoplasm. Providers:                Juanita Craver, MD, Kary Kos RN, RN, Elspeth Cho Tech., Technician, Vickii Penna, CRNA Referring MD:             [facility] Medicines:                Monitored Anesthesia Care Complications:            No immediate complications. Estimated Blood Loss:     Estimated blood loss was minimal. Procedure:                Pre-Anesthesia Assessment: - Prior to the                            procedure, a history and physical was performed,                            and patient medications and allergies were                            reviewed. The patient's tolerance of previous                            anesthesia was also reviewed. The risks and                            benefits of the procedure and the sedation options                            and risks were discussed with the patient. All                            questions were answered, and informed consent was                            obtained. Prior Anticoagulants: The patient has                            taken no previous anticoagulant or antiplatelet                            agents. ASA Grade Assessment: III - A patient with  severe systemic disease. After reviewing the risks                            and benefits, the patient was deemed in                            satisfactory condition to undergo the procedure.                            After obtaining informed consent, the colonoscope                             was passed under direct vision. Throughout the                            procedure, the patient's blood pressure, pulse, and                            oxygen saturations were monitored continuously. The                            [id number]-HQ190L ([id number] Olympus colonoscope was                            introduced through the anus and advanced to the the                            cecum, identified by appendiceal orifice and                            ileocecal valve. The colonoscopy was performed with                            moderate difficulty due to inadequate bowel prep.                            Successful completion of the procedure was aided by                            lavage. The patient tolerated the procedure well.                            The quality of the bowel preparation was adequate.                            The ileocecal valve, the appendiceal orifice and                            the rectum were photographed. The bowel preparation                            used was GoLYTELY via split dose instruction. Scope In: 2:36:06 PM Scope Out: 2:55:45 PM Scope Withdrawal Time: 0 hours 12 minutes 8  seconds  Total Procedure Duration: 0 hours 19 minutes 39 seconds  Findings:      A 6 mm sessile polyp was found in the sigmoid colon; the polyp was       removed with a cold snare x 1; resection and retrieval were complete.      A 10 mm sesile polyp was found in the hepatic flexure; the polyp was       removed with a hot snare x 1-200/20; resection and retrieval were       complete.      A large fungating non-obstructing large mass was found in the proximal       ascending colon; the mass was non-circumferential-this was biopsied for       pathology. Impression:               - One 6 mm sessile polyp in the sigmoid colon,                            removed with a cold snare x 1; resected and                            retrieved.                           - One 10 mm sessile  polyp at the hepatic flexure,                            removed with a hot snare x 1; resected and                            retrieved.                           - Large malignant tumor in the proximal ascending                            colon close to the ICV-biopsied. Moderate Sedation:      MAC used. Recommendation:           - Full liquid diet today.                           - Continue present medications; avoid all NSAIDS                            for now.                           - Perform CT scan of the chest, abdomen and pelvis                            with contrast at appointment to be scheduled.                           - Check CEA levels.                           -  Surgical consult requested. Procedure Code(s):        --- Professional ---                           [phone], Colonoscopy, flexible; with removal of                            tumor(s), polyp(s), or other lesion(s) by snare                            technique Diagnosis Code(s):        --- Professional ---                           D50.9, Iron deficiency anemia, unspecified                           K92.1, Melena (includes Hematochezia)                           D49.0, Neoplasm of unspecified behavior of                            digestive system                           K63.5, Polyp of colon                           Z12.11, Encounter for screening for malignant                            neoplasm of colon CPT copyright 2019 American Medical [employer]. All rights reserved. The codes documented in this report are preliminary and upon coder review may  be revised to meet current compliance requirements. Juanita Craver, MD Juanita Craver, MD 09/24/2020 3:30:41 PM This report has been signed electronically. Number of Addenda: 0

## 2020-09-24 NOTE — Transfer of Care (Signed)
Immediate Anesthesia Transfer of Care Note  Patient: Jeff Kelley  Procedure(s) Performed: ESOPHAGOGASTRODUODENOSCOPY (EGD) WITH PROPOFOL (N/A ) COLONOSCOPY WITH PROPOFOL (N/A ) BIOPSY POLYPECTOMY ESOPHAGEAL BRUSHING  Patient Location: Endoscopy Unit  Anesthesia Type:MAC  Level of Consciousness: awake, alert  and patient cooperative  Airway & Oxygen Therapy: Patient Spontanous Breathing  Post-op Assessment: Report given to RN and Post -op Vital signs reviewed and stable  Post vital signs: Reviewed and stable  Last Vitals:  Vitals Value Taken Time  BP 104/60 09/24/20 1508  Temp    Pulse 102 09/24/20 1510  Resp 26 09/24/20 1510  SpO2 98 % 09/24/20 1510  Vitals shown include unvalidated device data.  Last Pain:  Vitals:   09/24/20 1330  TempSrc: Oral  PainSc: 0-No pain      Patients Stated Pain Goal: 5 (19/41/74 0814)  Complications: No complications documented.

## 2020-09-24 NOTE — Progress Notes (Addendum)
Progress Note  Patient Name: Jeff Kelley Date of Encounter: 09/24/2020  [facility] Cardiologist: Evalina Field, MD   Subjective   Denies any hx of CP.   Admitted with SOB and found to be severely anemic with Hbg 6.1. Initial hsTrop 109>>110.  EKG abnormal on admit with ? AS MI and nonspecific ST/T wave abnormality and 2D echo with normal LVF and HK of mid to distal AS and apical anterior wall.   Inpatient Medications    Scheduled Meds: . atorvastatin  80 mg Oral Daily  . Cenobamate  100 mg Oral Daily  . insulin aspart  0-6 Units Subcutaneous Q4H  . lamoTRIgine  400 mg Oral BID  . levETIRAcetam  1,125 mg Oral BID  . pantoprazole  40 mg Oral Daily  . PHENobarbital  64.8 mg Oral BID   Continuous Infusions: . sodium chloride    . sodium chloride     PRN Meds: sodium chloride, acetaminophen, LORazepam, ondansetron (ZOFRAN) IV   Vital Signs    Vitals:   09/23/20 1555 09/23/20 1947 09/23/20 2346 09/24/20 0353  BP: (!) 139/93 118/79 132/87 120/87  Pulse: 87 87 90 98  Resp: 17 20 20 19   Temp: 97.7 F (36.5 C) 97.7 F (36.5 C) 98.1 F (36.7 C) 98 F (36.7 C)  TempSrc:  Oral  Oral  SpO2: 99% 100% 96% 96%  Weight:      Height:        Intake/Output Summary (Last 24 hours) at 09/24/2020 0912 Last data filed at 09/23/2020 1718 Gross per 24 hour  Intake 600 ml  Output -  Net 600 ml   Last 3 Weights 09/23/2020 09/22/2020  Weight (lbs) 191 lb 8 oz 201 lb 15.1 oz  Weight (kg) 86.864 kg 91.6 kg      Telemetry    NSR - Personally Reviewed  ECG    No new EKG to review - Personally Reviewed  Physical Exam   GEN: No acute distress.   Neck: No JVD Cardiac: RRR, no murmurs, rubs, or gallops.  Respiratory: Clear to auscultation bilaterally. GI: Soft, nontender, non-distended  MS: No edema; No deformity. Neuro:  Nonfocal  Psych: Normal affect   Labs    High Sensitivity Troponin:   Recent Labs  Lab 09/21/20 2051 09/21/20 2245  TROPONINIHS 109* 110*       Chemistry Recent Labs  Lab 09/21/20 1654 09/23/20 0457 09/24/20 0323  NA 129* 130* 137  K 3.6 3.5 3.8  CL 95* 98 102  CO2 23 25 25   GLUCOSE 263* 176* 155*  BUN 9 11 9   CREATININE 0.70 0.81 0.87  CALCIUM 8.7* 8.6* 9.5  GFRNONAA >60 >60 >60  ANIONGAP 11 7 10      Hematology Recent Labs  Lab 09/23/20 0457 09/23/20 1602 09/24/20 0323  WBC 13.2* 12.4* 11.6*  RBC 4.51 4.84 4.99  HGB 7.7* 8.3* 8.6*  HCT 27.5* 30.0* 30.9*  MCV 61.0* 62.0* 61.9*  MCH 17.1* 17.1* 17.2*  MCHC 28.0* 27.7* 27.8*  RDW 27.1* 27.8* 28.5*  PLT 482* 477* 535*    BNP Recent Labs  Lab 09/21/20 2051  BNP 297.0*     DDimer No results for input(s): DDIMER in the last 168 hours.   Radiology    ECHOCARDIOGRAM COMPLETE  Result Date: 09/22/2020    ECHOCARDIOGRAM REPORT   Patient Name:   Jeff Kelley Date of Exam: 09/22/2020 Medical Rec #:  [id number]       Height:  66.0 in Accession #:    [id number]      Weight:       201.9 lb Date of Birth:  1970/11/28       BSA:          2.008 m Patient Age:    79 years        BP:           137/93 mmHg Patient Gender: M               HR:           94 bpm. Exam Location:  Inpatient Procedure: 2D Echo, 3D Echo, Cardiac Doppler, Color Doppler and Strain Analysis Indications:    Dyspnea and elevated troponins  History:        Patient has no prior history of Echocardiogram examinations.                 CHF.  Sonographer:    Luisa Hart RDCS Referring Phys: [id number] [place]  1. Left ventricular ejection fraction by 3D volume is 56 %. The left ventricle has normal function. The left ventricle demonstrates regional wall motion abnormalities (see scoring diagram/findings for description). Left ventricular diastolic parameters are consistent with Grade II diastolic dysfunction (pseudonormalization).  2. Right ventricular systolic function is normal. The right ventricular size is normal. Tricuspid regurgitation signal is inadequate for assessing PA pressure.   3. The mitral valve is normal in structure. Trivial mitral valve regurgitation. No evidence of mitral stenosis.  4. The aortic valve is grossly normal. Aortic valve regurgitation is not visualized. No aortic stenosis is present.  5. Aortic dilatation noted. There is mild dilatation of the aortic root, measuring 40 mm.  6. The inferior vena cava is normal in size with greater than 50% respiratory variability, suggesting right atrial pressure of 3 mmHg. FINDINGS  Left Ventricle: Left ventricular ejection fraction by 3D volume is 56 %. The left ventricle has normal function. The left ventricle demonstrates regional wall motion abnormalities. Global longitudinal strain performed but not reported based on interpreter judgement due to suboptimal tracking. The left ventricular internal cavity size was normal in size. There is no left ventricular hypertrophy. Left ventricular diastolic parameters are consistent with Grade II diastolic dysfunction (pseudonormalization).  LV Wall Scoring: The mid and distal anterior septum and apical anterior segment are hypokinetic. Right Ventricle: The right ventricular size is normal. No increase in right ventricular wall thickness. Right ventricular systolic function is normal. Tricuspid regurgitation signal is inadequate for assessing PA pressure. Left Atrium: Left atrial size was normal in size. Right Atrium: Right atrial size was normal in size. Pericardium: There is no evidence of pericardial effusion. Mitral Valve: The mitral valve is normal in structure. Trivial mitral valve regurgitation. No evidence of mitral valve stenosis. Tricuspid Valve: The tricuspid valve is normal in structure. Tricuspid valve regurgitation is not demonstrated. No evidence of tricuspid stenosis. Aortic Valve: The aortic valve is grossly normal. Aortic valve regurgitation is not visualized. No aortic stenosis is present. Aortic valve mean gradient measures 6.0 mmHg. Aortic valve peak gradient measures 11.3  mmHg. Aortic valve area, by VTI measures  3.63 cm. Pulmonic Valve: The pulmonic valve was grossly normal. Pulmonic valve regurgitation is trivial. No evidence of pulmonic stenosis. Aorta: Aortic dilatation noted. There is mild dilatation of the aortic root, measuring 40 mm. Venous: A systolic blunting flow pattern is recorded from the right lower pulmonary vein. The inferior vena cava is normal in size with greater  than 50% respiratory variability, suggesting right atrial pressure of 3 mmHg. IAS/Shunts: No atrial level shunt detected by color flow Doppler.  LEFT VENTRICLE PLAX 2D LVIDd:         5.00 cm         Diastology LVIDs:         4.70 cm         LV e' medial:    4.79 cm/s LV PW:         0.80 cm         LV E/e' medial:  28.8 LV IVS:        1.00 cm         LV e' lateral:   11.60 cm/s LVOT diam:     2.30 cm         LV E/e' lateral: 11.9 LV SV:         103 LV SV Index:   51 LVOT Area:     4.15 cm        3D Volume EF                                LV 3D EF:    Left                                             ventricular                                             ejection                                             fraction by                                             3D volume                                             is 56 %.                                 3D Volume EF:                                3D EF:        56 %  PULMONARY VEINS A Reversal Duration: 116.00 msec A Reversal Velocity: 52.84 cm/s Diastolic Velocity:  13.24 cm/s S/D Velocity:        4.01 Systolic Velocity:   02.72 cm/s LEFT ATRIUM           Index       RIGHT ATRIUM           Index LA diam:      4.70 cm 2.34 cm/m  RA Area:     13.00 cm LA Vol (A2C): 45.5 ml 22.66 ml/m RA Volume:   27.40 ml  13.65 ml/m  AORTIC VALVE                    PULMONIC VALVE AV Area (Vmax):    3.73 cm     PV Vmax:       1.09 m/s AV Area (Vmean):   3.89 cm     PV Vmean:      84.200 cm/s AV Area (VTI):     3.63 cm     PV VTI:        0.198 m AV Vmax:            168.00 cm/s  PV Peak grad:  4.7 mmHg AV Vmean:          109.000 cm/s PV Mean grad:  3.0 mmHg AV VTI:            0.284 m AV Peak Grad:      11.3 mmHg AV Mean Grad:      6.0 mmHg LVOT Vmax:         151.00 cm/s LVOT Vmean:        102.000 cm/s LVOT VTI:          0.248 m LVOT/AV VTI ratio: 0.87  AORTA Ao Root diam: 4.00 cm MITRAL VALVE MV Area (PHT): 5.23 cm     SHUNTS MV Decel Time: 145 msec     Systemic VTI:  0.25 m MR Peak grad: 111.9 mmHg    Systemic Diam: 2.30 cm MR Mean grad: 76.0 mmHg MR Vmax:      529.00 cm/s MR Vmean:     417.0 cm/s MV E velocity: 138.00 cm/s MV A velocity: 70.40 cm/s MV E/A ratio:  1.96 Cherlynn Kaiser MD Electronically signed by Cherlynn Kaiser MD Signature Date/Time: 09/22/2020/4:28:35 PM    Final     Cardiac Studies   2D echo 09/22/2020 IMPRESSIONS   1. Left ventricular ejection fraction by 3D volume is 56 %. The left  ventricle has normal function. The left ventricle demonstrates regional  wall motion abnormalities (see scoring diagram/findings for description).  Left ventricular diastolic parameters  are consistent with Grade II diastolic dysfunction (pseudonormalization).  2. Right ventricular systolic function is normal. The right ventricular  size is normal. Tricuspid regurgitation signal is inadequate for assessing  PA pressure.  3. The mitral valve is normal in structure. Trivial mitral valve  regurgitation. No evidence of mitral stenosis.  4. The aortic valve is grossly normal. Aortic valve regurgitation is not  visualized. No aortic stenosis is present.  5. Aortic dilatation noted. There is mild dilatation of the aortic root,  measuring 40 mm.  6. The inferior vena cava is normal in size with greater than 50%  respiratory variability, suggesting right atrial pressure of 3 mmHg.   Patient Profile     50 y.o. male with a hx of seizure disorder, diabetes (found this admission), obesity who is being seen  for the evaluation of abnormal echocardiogram at  the request of Irene Pap, MD.  Assessment & Plan    Abnormal EKG/Demand Ischemia -admitted with presumed GI bleed and severe anemia (Hbg 6.1) -Hemoccult is positive.  -Troponins were elevated but flat.   -His EKG demonstrates nonspecific ST-T changes. -echo demonstrates normal LV function with hypokinesis of the mid to apical anterior segment and apical septum.   -He has not had any CP -shortness of breath  likely related to his anemia.  -he does have CVD risk factors including new onset diabetes which is just been found. -Overall, this does not represent an acute coronary syndrome.  This represents demand ischemia in the setting of severe anemia.   -suspect he likely has underlying obstructive disease in the mid LAD.  This would fit his wall motion abnormalities.   -given his severe anemia there is not a lot we can do about this currently.   -would not recommend aspirin or heparin given his life-threatening presumed GI bleed.   -continue with aggressive work-up of his anemia. -he has been started on high dose statin and LDL was 166 this am -repeat EKG after tachycardia resolved showed normal ST segements and no AS infarct.  -BB held for tachycardia as elevated HR is normal response to anemia -There really is no need to treat this aggressively given his lack of symptoms and findings that are inconsistent with an acute coronary syndrome. -EGD and colonoscopy for ischemia evaluation today -will plan coronary CTA tomorrow  I have spent a total of 30 minutes with patient reviewing 2D echo , telemetry, EKGs, labs and examining patient as well as establishing an assessment and plan that was discussed with the patient.  > 50% of time was spent in direct patient care.      For questions or updates, please contact [place] Please consult [email or web address] for contact info under        Signed, Fransico Him, MD  09/24/2020, 9:12 AM

## 2020-09-24 NOTE — H&P (Signed)
50 year old white male undergoing EGD/COLONSOCPY for IDA, blood in stool and 30 lb weight loss in the last year. Risk and benefits of the procedure discussed with him in detail.Marland Kitchen

## 2020-09-24 NOTE — Care Management Important Message (Signed)
Important Message  Patient Details  Name: Jeff Kelley MRN: [id number] Date of Birth: [birth year]   Medicare Important Message Given:  Yes     Teretha Chalupa [address] 09/24/2020, 4:15 PM

## 2020-09-25 ENCOUNTER — Inpatient Hospital Stay (HOSPITAL_COMMUNITY): Payer: Medicare HMO

## 2020-09-25 DIAGNOSIS — R931 Abnormal findings on diagnostic imaging of heart and coronary circulation: Secondary | ICD-10-CM | POA: Diagnosis not present

## 2020-09-25 DIAGNOSIS — R778 Other specified abnormalities of plasma proteins: Secondary | ICD-10-CM | POA: Diagnosis not present

## 2020-09-25 DIAGNOSIS — D649 Anemia, unspecified: Secondary | ICD-10-CM | POA: Diagnosis not present

## 2020-09-25 DIAGNOSIS — E7800 Pure hypercholesterolemia, unspecified: Secondary | ICD-10-CM | POA: Diagnosis not present

## 2020-09-25 DIAGNOSIS — I2510 Atherosclerotic heart disease of native coronary artery without angina pectoris: Secondary | ICD-10-CM

## 2020-09-25 LAB — CYTOLOGY - NON PAP

## 2020-09-25 LAB — COMPREHENSIVE METABOLIC PANEL
ALT: 12 U/L (ref 0–44)
AST: 13 U/L — ABNORMAL LOW (ref 15–41)
Albumin: 2.9 g/dL — ABNORMAL LOW (ref 3.5–5.0)
Alkaline Phosphatase: 70 U/L (ref 38–126)
Anion gap: 8 (ref 5–15)
BUN: 7 mg/dL (ref 6–20)
CO2: 24 mmol/L (ref 22–32)
Calcium: 8.8 mg/dL — ABNORMAL LOW (ref 8.9–10.3)
Chloride: 103 mmol/L (ref 98–111)
Creatinine, Ser: 0.7 mg/dL (ref 0.61–1.24)
GFR, Estimated: >60 mL/min (ref >60)
Glucose, Bld: 117 mg/dL — ABNORMAL HIGH (ref 70–99)
Potassium: 3.3 mmol/L — ABNORMAL LOW (ref 3.5–5.1)
Sodium: 135 mmol/L (ref 135–145)
Total Bilirubin: 0.6 mg/dL (ref 0.3–1.2)
Total Protein: 6.2 g/dL — ABNORMAL LOW (ref 6.5–8.1)

## 2020-09-25 LAB — CBC
HCT: 26.9 % — ABNORMAL LOW (ref 39.0–52.0)
Hemoglobin: 7.5 g/dL — ABNORMAL LOW (ref 13.0–17.0)
MCH: 17.3 pg — ABNORMAL LOW (ref 26.0–34.0)
MCHC: 27.9 g/dL — ABNORMAL LOW (ref 30.0–36.0)
MCV: 62.1 fL — ABNORMAL LOW (ref 80.0–100.0)
Platelets: 428 10*3/uL — ABNORMAL HIGH (ref 150–400)
RBC: 4.33 MIL/uL (ref 4.22–5.81)
RDW: 29 % — ABNORMAL HIGH (ref 11.5–15.5)
WBC: 9.3 10*3/uL (ref 4.0–10.5)
nRBC: 0 % (ref 0.0–0.2)

## 2020-09-25 LAB — GLUCOSE, CAPILLARY
Glucose-Capillary: 108 mg/dL — ABNORMAL HIGH (ref 70–99)
Glucose-Capillary: 120 mg/dL — ABNORMAL HIGH (ref 70–99)
Glucose-Capillary: 122 mg/dL — ABNORMAL HIGH (ref 70–99)
Glucose-Capillary: 126 mg/dL — ABNORMAL HIGH (ref 70–99)
Glucose-Capillary: 132 mg/dL — ABNORMAL HIGH (ref 70–99)
Glucose-Capillary: 147 mg/dL — ABNORMAL HIGH (ref 70–99)

## 2020-09-25 LAB — BASIC METABOLIC PANEL
Anion gap: 10 (ref 5–15)
BUN: 5 mg/dL — ABNORMAL LOW (ref 6–20)
CO2: 22 mmol/L (ref 22–32)
Calcium: 8.9 mg/dL (ref 8.9–10.3)
Chloride: 100 mmol/L (ref 98–111)
Creatinine, Ser: 0.71 mg/dL (ref 0.61–1.24)
GFR, Estimated: >60 mL/min (ref >60)
Glucose, Bld: 126 mg/dL — ABNORMAL HIGH (ref 70–99)
Potassium: 3.8 mmol/L (ref 3.5–5.1)
Sodium: 132 mmol/L — ABNORMAL LOW (ref 135–145)

## 2020-09-25 LAB — PHOSPHORUS: Phosphorus: 3.4 mg/dL (ref 2.5–4.6)

## 2020-09-25 LAB — PREALBUMIN: Prealbumin: 16.2 mg/dL — ABNORMAL LOW (ref 18–38)

## 2020-09-25 LAB — PREPARE RBC (CROSSMATCH)

## 2020-09-25 LAB — CEA: CEA: 12.4 ng/mL — ABNORMAL HIGH (ref 0.0–4.7)

## 2020-09-25 LAB — MAGNESIUM
Magnesium: 2 mg/dL (ref 1.7–2.4)
Magnesium: 1.9 mg/dL (ref 1.7–2.4)

## 2020-09-25 MED ORDER — AQUAPHOR EX OINT
1.0000 "application " | TOPICAL_OINTMENT | Freq: Two times a day (BID) | CUTANEOUS | Status: DC | PRN
  Filled 2020-09-25: qty 50

## 2020-09-25 MED ORDER — SODIUM CHLORIDE 0.9 % IV SOLN
2.0000 g | INTRAVENOUS | Status: DC
  Filled 2020-09-25: qty 2

## 2020-09-25 MED ORDER — ALVIMOPAN 12 MG PO CAPS
12.0000 mg | ORAL_CAPSULE | ORAL | Status: AC
  Filled 2020-09-25: qty 1

## 2020-09-25 MED ORDER — FERROUS SULFATE 325 (65 FE) MG PO TABS
325.0000 mg | ORAL_TABLET | Freq: Every day | ORAL | Status: DC
  Administered 2020-09-25 – 2020-09-28 (×4): 325 mg via ORAL
  Filled 2020-09-25 (×4): qty 1

## 2020-09-25 MED ORDER — CHLORHEXIDINE GLUCONATE CLOTH 2 % EX PADS
6.0000 | MEDICATED_PAD | Freq: Once | CUTANEOUS | Status: AC
  Administered 2020-09-26: 6 via TOPICAL

## 2020-09-25 MED ORDER — LORAZEPAM 1 MG PO TABS
1.0000 mg | ORAL_TABLET | Freq: Two times a day (BID) | ORAL | Status: DC
  Administered 2020-09-25 – 2020-09-28 (×6): 1 mg via ORAL
  Filled 2020-09-25 (×7): qty 1

## 2020-09-25 MED ORDER — BOOST / RESOURCE BREEZE PO LIQD CUSTOM
1.0000 | Freq: Three times a day (TID) | ORAL | Status: DC
  Administered 2020-09-25 – 2020-09-26 (×2): 1 via ORAL

## 2020-09-25 MED ORDER — IOHEXOL 350 MG/ML SOLN
80.0000 mL | Freq: Once | INTRAVENOUS | Status: AC | PRN
  Administered 2020-09-25: 80 mL via INTRAVENOUS

## 2020-09-25 MED ORDER — POTASSIUM CHLORIDE CRYS ER 20 MEQ PO TBCR
40.0000 meq | EXTENDED_RELEASE_TABLET | Freq: Two times a day (BID) | ORAL | Status: AC
  Administered 2020-09-25 (×2): 40 meq via ORAL
  Filled 2020-09-25 (×2): qty 2

## 2020-09-25 MED ORDER — SODIUM CHLORIDE 0.9% IV SOLUTION: Freq: Once | INTRAVENOUS | Status: AC

## 2020-09-25 MED ORDER — NITROGLYCERIN 0.4 MG SL SUBL
SUBLINGUAL_TABLET | SUBLINGUAL | Status: AC
  Filled 2020-09-25: qty 2

## 2020-09-25 MED ORDER — METOPROLOL TARTRATE 5 MG/5ML IV SOLN
INTRAVENOUS | Status: AC
  Filled 2020-09-25: qty 5

## 2020-09-25 NOTE — Progress Notes (Signed)
PROGRESS NOTE  Jeff Kelley KVQ:[id number] DOB: 03-29-1971 DOA: 09/21/2020 PCP: Patient, No Pcp Per  HPI/Recap of past 24 hours: Jeff Kelley is a 50 y.o. male with medical history significant for seizure disorder and anxiety, now presenting to the emergency department for evaluation of shortness of breath.  Patient began to notice shortness of breath with exertion approximately 1 month ago, slowly progressed to the point where he was dyspneic at rest. No significant cough, fevers, or chills.  No chest pain or leg swelling.  Shortness of breath was much worse last night when he was laying down and woke him from sleep in a panic but he notes a history of anxiety and panic and is unable to say whether his breathing is better when sitting up.  He denies any known history of heart disease.  ED Course: Upon arrival to the ED, patient is found to be afebrile, saturating well on room air, tachypneic, tachycardic, and with stable blood pressure.  EKG features sinus tachycardia and nonspecific ST-T abnormality.  Chest x-ray notable for prominent interstitial markings and small bilateral pleural effusions.  Chemistry panel notable for glucose 263 and sodium 129.  CBC features a leukocytosis 12,700, platelets 585,000, and hemoglobin 6.1 with MCV 56.3.  High-sensitivity troponin is 109 and BNP 297.  Fecal occult blood testing was positive.  RBC transfusion was ordered from the ED.  Post EGD/colonoscopy on 09/24/2020.  Discussed with GI Dr. Collene Mares findings concerning for esophageal candidiasis and a large mass in the colon.  CEA ordered and pending.  Will obtain CT chest/abdomen/pelvis with contrast for staging.  Started gentle IV fluid hydration to prevent contrast-induced nephropathy.  General surgery has been consulted by GI.  09/25/20: Patient was seen and examined at bedside this morning.  He has no new complaints.  He is aware of new findings.  Would like his girlfriend of 6 years Jeff Kelley to be updated.   He states he has a 26 year old daughter who lives with her own mother.  Assessment/Plan: Principal Problem:   Symptomatic anemia Active Problems:   Seizures (HCC)   Occult GI bleeding   Elevated troponin   CHF (congestive heart failure) (HCC)   Hyperglycemia   Iron deficiency anemia   Hyponatremia   Abnormal echocardiogram  Newly diagnosed colon mass seen on colonoscopy done on 09/24/2020 by Dr. Collene Mares Colonoscopy done on 09/24/2020 by Dr. Collene Mares, GI revealed: One 6 mm sessile polyp in the sigmoid colon, removed with a cold snare x 1; resected and retrieved. - One 10 mm sessile polyp at the hepatic flexure, removed with a hot snare x 1; resected and retrieved. 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 CEA pending.  Follow results. CT chest/abdomen/pelvis with contrast pending.  Follow results. Started gentle IV fluid hydration normal saline at 50 cc/h x 2 days to avoid contrast-induced nephropathy. General surgery consulted Appreciate specialists assistance.  Newly diagnosed esophageal candidiasis seen on EGD done on 09/24/2020 by Dr. Collene Mares Started p.o. Diflucan 100 mg daily x7 days.  Newly diagnosed ascending aortic aneurysm measuring 4.4 cm Patient made aware of this finding and of recommendations.  ACCF/AHA guidelines. Recommended annual imaging follow-up by CTA or MRA.  Severe symptomatic iron deficiency anemia with positive FOBT likely secondary to chronic blood loss in the setting of large malignant colon mass. Presented with gradually worsening shortness of breath with exertion of approximately 1 month duration. On presentation, hemoglobin of 6.1K, MCV 56.3, positive FOBT, severe iron deficiency.  2 unit PRBCs transfused on 09/22/2020. Hemoglobin dropped to 7.5 from 8.6 Transfused 2 unit PRBC due to possible surgical procedure.  Elevated troponin likely secondary to demand ischemia in the setting of severe anemia Presented with troponin  of 109, peaked at 110. Denies any anginal symptoms at the time of this visit. Continue to monitor on telemetry. Follow results of 2D echo done on 09/30/2020.  Diastolic CHF/mid and distal anterior septum, apical anterior segments are hypokinetic. Presented with BNP 297 2D echo showing LVEF 56% left ventricle demonstrate regional wall motion abnormalities, left ventricle diastolic parameters are consistent with grade 2 diastolic dysfunction. Cardiology consult for further assessment and management. Started on Lipitor 80 mg daily. Will need to be cleared by GI for aspirin.  Hypokalemia Serum potassium 3.3 Repleted with p.o. KCl 40 mEq x 2 doses Goal potassium 4.0. Magnesium 2.0.  Resolved leukocytosis, likely reactive in the setting of severe anemia. Nonseptic appearing, afebrile with no leukocytosis currently.  Resolved hypovolemic hyponatremia Presented with serum sodium of 129 Uptrending 130> 137.  Type 2 diabetes with hyperglycemia Hemoglobin A1c 9.6 on 09/30/2020 Continue insulin sliding scale. Continue to monitor CBGs  Acute CHF, unspecified at this time.  No prior history. Presented with progressive shortness of breath over 1 month Elevated BNP and presentation to 97 Chest x-ray findings concerning for CHF. Received 1 dose of Lasix 40 mg IV x1. 2D echo done on 09/30/2020 showed LVEF 56%, the left ventricle demonstrate regional wall motion abnormalities, grade 2 diastolic dysfunction. Strict I's and O's and daily weight Closely monitor volume status in the setting of IV fluid hydration.  Seizure disorder Continue home regimen. Seizures precautions.  Chronic anxiety Continue home regimen.     Code Status: Full code.  Family Communication: None at bedside  Disposition Plan: Likely will discharge to home once GI, general surgery, cardiology sign off.  Consultants:  GI  Cardiology  General surgery   Procedures:  2D echo on 09/22/2020.  EGD/colonoscopy  on 09/24/2020 by Dr. Collene Mares.  Antimicrobials:  None.  DVT prophylaxis: SCDs.  Status is: Inpatient    Dispo:  Patient From: Home  Planned Disposition: Home  Anticipated discharge date: 09/27/2020 post EGD and colonoscopy, or when GI, general surgery, and cardiology sign off.  Medically stable for discharge: No, ongoing management of severe symptomatic anemia, esophageal candidiasis..          Objective: Vitals:   09/24/20 2008 09/24/20 2255 09/25/20 0337 09/25/20 1030  BP: 115/76 128/76 (!) 150/89 103/73  Pulse: (!) 103 99 91   Resp: 20 20 18 18   Temp: 97.6 F (36.4 C) 97.6 F (36.4 C) 98.2 F (36.8 C) 98.7 F (37.1 C)  TempSrc:  Oral Oral Oral  SpO2: 100% 100% 99% 100%  Weight:      Height:        Intake/Output Summary (Last 24 hours) at 09/25/2020 1626 Last data filed at 09/24/2020 1700 Gross per 24 hour  Intake 500 ml  Output -  Net 500 ml   Filed Weights   09/22/20 0321 09/23/20 0830  Weight: 91.6 kg 86.9 kg    Exam:  . General: 50 y.o. year-old male pleasant well-developed well-nourished in no acute distress.  He is alert oriented x3. . Cardiovascular: Regular rate and rhythm no rubs or gallops. Marland Kitchen Respiratory: Clear to auscultation no wheezes or rales. . Abdomen: Obese nontender normal bowel sounds present.   . Musculoskeletal: No lower extremity edema bilaterally. . Skin: N no ulcerative lesions noted. Marland Kitchen Psychiatry:  Mood is appropriate for condition and setting.   Data Reviewed: CBC: Recent Labs  Lab 09/22/20 1637 09/23/20 0457 09/23/20 1602 09/24/20 0323 09/25/20 0228  WBC 16.9* 13.2* 12.4* 11.6* 9.3  HGB 8.9* 7.7* 8.3* 8.6* 7.5*  HCT 31.4* 27.5* 30.0* 30.9* 26.9*  MCV 60.3* 61.0* 62.0* 61.9* 62.1*  PLT 548* 482* 477* 535* 161*   Basic Metabolic Panel: Recent Labs  Lab 09/21/20 1654 09/23/20 0457 09/24/20 0323 09/25/20 0228  NA 129* 130* 137 135  K 3.6 3.5 3.8 3.3*  CL 95* 98 102 103  CO2 23 25 25 24   GLUCOSE 263* 176* 155*  117*  BUN 9 11 9 7   CREATININE 0.70 0.81 0.87 0.70  CALCIUM 8.7* 8.6* 9.5 8.8*  MG  --   --   --  2.0   GFR: Estimated Creatinine Clearance: 114.1 mL/min (by C-G formula based on SCr of 0.7 mg/dL). Liver Function Tests: Recent Labs  Lab 09/25/20 0228  AST 13*  ALT 12  ALKPHOS 70  BILITOT 0.6  PROT 6.2*  ALBUMIN 2.9*   No results for input(s): LIPASE, AMYLASE in the last 168 hours. No results for input(s): AMMONIA in the last 168 hours. Coagulation Profile: No results for input(s): INR, PROTIME in the last 168 hours. Cardiac Enzymes: No results for input(s): CKTOTAL, CKMB, CKMBINDEX, TROPONINI in the last 168 hours. BNP (last 3 results) No results for input(s): PROBNP in the last 8760 hours. HbA1C: No results for input(s): HGBA1C in the last 72 hours. CBG: Recent Labs  Lab 09/24/20 2253 09/25/20 0333 09/25/20 0839 09/25/20 1150 09/25/20 1616  GLUCAP 153* 132* 120* 126* 122*   Lipid Profile: Recent Labs    09/24/20 0323  CHOL 221*  HDL 39*  LDLCALC 166*  TRIG 79  CHOLHDL 5.7   Thyroid Function Tests: Recent Labs    09/23/20 1602  TSH 2.441   Anemia Panel: Recent Labs    09/23/20 0457  VITAMINB12 670  FOLATE 19.6  FERRITIN 8*  TIBC 462*  IRON 16*   Urine analysis: No results found for: COLORURINE, APPEARANCEUR, LABSPEC, PHURINE, GLUCOSEU, HGBUR, BILIRUBINUR, KETONESUR, PROTEINUR, UROBILINOGEN, NITRITE, LEUKOCYTESUR Sepsis Labs: @LABRCNTIP (procalcitonin:4,lacticidven:4)  ) Recent Results (from the past 240 hour(s))  SARS CORONAVIRUS 2 (TAT 6-24 HRS) Nasopharyngeal Nasopharyngeal Swab     Status: None   Collection Time: 09/21/20  8:45 PM   Specimen: Nasopharyngeal Swab  Result Value Ref Range Status   SARS Coronavirus 2 NEGATIVE NEGATIVE Final    Comment: (NOTE) SARS-CoV-2 target nucleic acids are NOT DETECTED.  The SARS-CoV-2 RNA is generally detectable in upper and lower respiratory specimens during the acute phase of infection.  Negative results do not preclude SARS-CoV-2 infection, do not rule out co-infections with other pathogens, and should not be used as the sole basis for treatment or other patient management decisions. Negative results must be combined with clinical observations, patient history, and epidemiological information. The expected result is Negative.  Fact Sheet for Patients: SugarRoll.be  Fact Sheet for Healthcare Providers: https://www.woods-mathews.com/  This test is not yet approved or cleared by the [place] FDA and  has been authorized for detection and/or diagnosis of SARS-CoV-2 by FDA under an Emergency Use Authorization (EUA). This EUA will remain  in effect (meaning this test can be used) for the duration of the COVID-19 declaration under Se ction 564(b)(1) of the Act, 21 U.S.C. section 360bbb-3(b)(1), unless the authorization is terminated or revoked sooner.  Performed at [facility], [place] [address]  [place], [place] [address]   MRSA PCR Screening     Status: None   Collection Time: 09/22/20  4:25 AM   Specimen: Nasopharyngeal  Result Value Ref Range Status   MRSA by PCR NEGATIVE NEGATIVE Final    Comment:        The GeneXpert MRSA Assay (FDA approved for NASAL specimens only), is one component of a comprehensive MRSA colonization surveillance program. It is not intended to diagnose MRSA infection nor to guide or monitor treatment for MRSA infections. Performed at [facility], [place] [address], [place], [place] [address]       Studies: CT CHEST ABDOMEN PELVIS W CONTRAST  Result Date: 09/24/2020 CLINICAL DATA:  Fungating ascending colonic mass on colonoscopy, suspect colon cancer EXAM: CT CHEST, ABDOMEN, AND PELVIS WITH CONTRAST TECHNIQUE: Multidetector CT imaging of the chest, abdomen and pelvis was performed following the standard protocol during bolus administration of intravenous contrast. CONTRAST:  197mL  OMNIPAQUE IOHEXOL 300 MG/ML  SOLN COMPARISON:  09/21/2020 FINDINGS: CT CHEST FINDINGS Cardiovascular: The heart is unremarkable without pericardial effusion. Ascending thoracic aortic aneurysm measuring 4.4 cm. No evidence of dissection. Minimal atherosclerosis of the distal aortic arch. Mediastinum/Nodes: No enlarged mediastinal, hilar, or axillary lymph nodes. Thyroid gland, trachea, and esophagus demonstrate no significant findings. Lungs/Pleura: There are small bilateral pleural effusions, with minimal dependent lower lobe atelectasis. Patchy ground-glass consolidation within the right upper lobe, suspect hypoventilatory changes or even mild aspiration given recent endoscopy. Inflammatory or infectious etiology could also be considered though less likely. No pneumothorax. The central airways are patent. Musculoskeletal: No acute or destructive bony lesions. CT ABDOMEN PELVIS FINDINGS Hepatobiliary: No focal liver abnormality. No evidence of metastatic disease. No biliary dilation. The gallbladder is unremarkable. Pancreas: Unremarkable. No pancreatic ductal dilatation or surrounding inflammatory changes. Spleen: Normal in size without focal abnormality. Adrenals/Urinary Tract: Right renal cysts are identified within the mid and lower poles. Subcentimeter hypodensities within the upper pole right kidney and throughout the left kidney are too small to characterize, but also likely reflect cysts. There are 2 small nonobstructing calculi within the lower pole left kidney, measuring up to 3 mm in size. No obstructive uropathy within either kidney. The adrenals and bladder are unremarkable. Stomach/Bowel: An eccentric intraluminal and transmural mass is seen along the anti mesenteric aspect of the proximal colon, measuring approximately 5.0 x 1.4 cm in transverse dimension, and extending approximately 3.7 cm in craniocaudal extent. Findings are compatible with the colon mass described on today's colonoscopy, highly  suspicious for malignancy. The remainder of the bowel is unremarkable without obstruction or ileus. Normal appendix right lower quadrant. Vascular/Lymphatic: Mild atherosclerosis throughout the aorta and iliac vessels. There are multiple small lymph nodes in the right lower quadrant mesentery, measuring up to 6 mm in size, nonspecific. No pathologic adenopathy within the abdomen or pelvis. Reproductive: Prostate is unremarkable. Other: Ventriculostomy catheter enters the abdomen in the right lower quadrant, tip within the left hemipelvis. No free fluid or free intraperitoneal gas. No abdominal wall hernia. Musculoskeletal: No acute or destructive bony lesions. IMPRESSION: 1. 5.0 x 1.4 x 3.7 cm eccentric intraluminal and transmural proximal colonic mass highly concerning for colon cancer. No evidence of obstruction. 2. Multiple subcentimeter lymph nodes within the right lower quadrant mesentery, nonspecific. No pathologically enlarged nodes. PET scan may be useful if there is concern for regional nodal metastases, though small size may limit evaluation. 3. Small bilateral pleural effusions. 4. Minimal ground-glass airspace disease within the right upper lobe, favor hypoventilatory  change or aspiration after upper endoscopy. 5. 4.4 cm ascending thoracic aortic aneurysm. Recommend annual imaging followup by CTA or MRA. This recommendation follows 2010 ACCF/AHA/AATS/ACR/ASA/SCA/SCAI/SIR/STS/SVM Guidelines for the Diagnosis and Management of Patients with Thoracic Aortic Disease. Circulation. 2010; 121: J497-W263. Aortic aneurysm NOS (ICD10-I71.9) 6.  Aortic Atherosclerosis (ICD10-I70.0). Electronically Signed   By: Randa Ngo M.D.   On: 09/24/2020 21:11   CT CORONARY MORPH W/CTA COR W/SCORE W/CA W/CM &/OR WO/CM  Addendum Date: 09/25/2020   ADDENDUM REPORT: 09/25/2020 15:25 EXAM: Cardiac/Coronary  CT TECHNIQUE: The patient was scanned on a [employer]. FINDINGS: A 120 kV prospective scan was triggered  in the descending thoracic aorta at 111 HU's. Axial non-contrast 3 mm slices were carried out through the heart. The data set was analyzed on a dedicated work station and scored using the [place]. Gantry rotation speed was 250 msecs and collimation was .6 mm. No beta blockade and 0.8 mg of sl NTG was given. The 3D data set was reconstructed in 5% intervals of the 67-82 % of the R-R cycle. Diastolic phases were analyzed on a dedicated work station using MPR, MIP and VRT modes. The patient received 80 cc of contrast. Aorta: Mildly dilated ascending aorta at 54mm (measured at the bifurcation of the main pulmonary artery). No calcifications. No dissection. Aortic Valve:  Trileaflet.  Scattered calcifications. Coronary Arteries:  Normal coronary origin.  Right dominance. RCA is a large dominant artery that gives rise to PDA and PLVB. There is mild calcified plaque in the proximal and mid RCA with associated stenosis of 25-49%. There is moderate to severe non calcified plaque in the mid to distal RCA with associated stenosis of at least 50-69% but could be higher than 70%. Study is limited by noise artifact. Left main is a large artery that gives rise to LAD and LCX arteries. There is no plaque. LAD is a large vessel that gives rise to a large trifurcating diagonal and a moderate sized D2. There is a high grade complex long severe mixed plaque in the mid LAD after the takeoff of a large diagonal with associated stenosis of 70-99%. The D1 has calcified plaque in the ostium that is at least 25-49% and possible non calcified plaque in the mid D1 but noise artifact prohibits accurate quantification. LCX is a non-dominant small artery that gives rise to one small OM1 branch. Only the proximal LCx is adequately visualized and there is no plaque. Other findings: Normal pulmonary vein drainage into the left atrium. Normal let atrial appendage without a thrombus. Normal size of the pulmonary artery. IMPRESSION: 1. Coronary  calcium score of 192. This was 94th percentile for age and sex matched control. 2.  Normal coronary origin with right dominance. 3.  Severe atherosclerosis.  CAD RADS 4a. 4. Consider symptom-guided anti-ischemic and preventive pharmacotherapy as well as risk factor modification per guideline-directed care. 5.  Consider cardiac catheterization. 6.  This study has been submitted for FFR flow analysis. Fransico Him Electronically Signed   By: Fransico Him   On: 09/25/2020 15:25   Result Date: 09/25/2020 EXAM: OVER-READ INTERPRETATION  CT CHEST The following report is an over-read performed by radiologist Dr. Vinnie Langton of [facility], [place] on 09/25/2020. This over-read does not include interpretation of cardiac or coronary anatomy or pathology. The coronary calcium score/coronary CTA interpretation by the cardiologist is attached. COMPARISON:  None. FINDINGS: Aortic atherosclerosis with ectasia of the ascending thoracic aorta (4.0 cm in diameter). Trace bilateral pleural effusions lying dependently  with associated areas of subsegmental atelectasis in the dependent portions of the lower lobes of the lungs bilaterally. Within the visualized portions of the thorax there are no suspicious appearing pulmonary nodules or masses, there is no acute consolidative airspace disease, no pneumothorax and no lymphadenopathy. Visualized portions of the upper abdomen are unremarkable. There are no aggressive appearing lytic or blastic lesions noted in the visualized portions of the skeleton. IMPRESSION: 1. Trace bilateral pleural effusions lying dependently with some associated passive subsegmental atelectasis in the lower lobes of the lungs bilaterally. 2.  Aortic Atherosclerosis (ICD10-I70.0). 3. Ectasia of the ascending thoracic aorta (4.0 cm in diameter). Recommend annual imaging followup by CTA or MRA. This recommendation follows 2010 ACCF/AHA/AATS/ACR/ASA/SCA/SCAI/SIR/STS/SVM Guidelines for the Diagnosis and  Management of Patients with Thoracic Aortic Disease. Circulation. 2010; 121: I203-T597. Aortic aneurysm NOS (ICD10-I71.9). Electronically Signed: By: Vinnie Langton M.D. On: 09/25/2020 11:20    Scheduled Meds: . atorvastatin  80 mg Oral Daily  . Cenobamate  100 mg Oral Daily  . Chlorhexidine Gluconate Cloth  6 each Topical Once   And  . Chlorhexidine Gluconate Cloth  6 each Topical Once  . feeding supplement  1 Container Oral TID BM  . ferrous sulfate  325 mg Oral Q breakfast  . fluconazole  100 mg Oral Daily  . insulin aspart  0-6 Units Subcutaneous Q4H  . lamoTRIgine  400 mg Oral BID  . levETIRAcetam  1,125 mg Oral BID  . LORazepam  1 mg Oral BID  . metoprolol tartrate      . nitroGLYCERIN      . pantoprazole  40 mg Oral Daily  . PHENobarbital  64.8 mg Oral BID  . potassium chloride  40 mEq Oral BID    Continuous Infusions: . sodium chloride    . sodium chloride    . sodium chloride 50 mL/hr at 09/25/20 1325  . [START ON 09/26/2020] cefoTEtan (CEFOTAN) IV       LOS: 4 days     Kayleen Memos, MD [facility] Pager [phone]  If 7PM-7AM, please contact night-coverage [email or web address] Password [facility] 09/25/2020, 4:26 PM

## 2020-09-25 NOTE — Progress Notes (Addendum)
Progress Note  Patient Name: Jeff Kelley Date of Encounter: 09/25/2020  [facility] Cardiologist: Evalina Field, MD   Subjective   Denies any hx of CP.   Admitted with SOB and found to be severely anemic with Hbg 6.1. Initial hsTrop 109>>110.  EKG abnormal on admit with ? AS MI and nonspecific ST/T wave abnormality and 2D echo with normal LVF and HK of mid to distal AS and apical anterior wall.   EGD yesterday with possible candidiasis.  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 Surgery consulted.    Inpatient Medications    Scheduled Meds: . atorvastatin  80 mg Oral Daily  . Cenobamate  100 mg Oral Daily  . feeding supplement  1 Container Oral TID BM  . ferrous sulfate  325 mg Oral Q breakfast  . fluconazole  100 mg Oral Daily  . insulin aspart  0-6 Units Subcutaneous Q4H  . lamoTRIgine  400 mg Oral BID  . levETIRAcetam  1,125 mg Oral BID  . LORazepam  1 mg Oral BID  . pantoprazole  40 mg Oral Daily  . PHENobarbital  64.8 mg Oral BID  . potassium chloride  40 mEq Oral BID   Continuous Infusions: . sodium chloride    . sodium chloride    . sodium chloride 50 mL/hr at 09/24/20 1629   PRN Meds: sodium chloride, acetaminophen, ondansetron (ZOFRAN) IV   Vital Signs    Vitals:   09/24/20 1547 09/24/20 2008 09/24/20 2255 09/25/20 0337  BP: 132/79 115/76 128/76 (!) 150/89  Pulse: 91 (!) 103 99 91  Resp: (!) 23 20 20 18   Temp:  97.6 F (36.4 C) 97.6 F (36.4 C) 98.2 F (36.8 C)  TempSrc:   Oral Oral  SpO2: 94% 100% 100% 99%  Weight:      Height:        Intake/Output Summary (Last 24 hours) at 09/25/2020 0174 Last data filed at 09/24/2020 1700 Gross per 24 hour  Intake 1000 ml  Output --  Net 1000 ml   Last 3 Weights 09/23/2020 09/22/2020  Weight (lbs) 191 lb 8 oz 201 lb 15.1 oz  Weight (kg) 86.864 kg 91.6 kg      Telemetry    NSR - Personally Reviewed  ECG    No new EKG to review - Personally Reviewed  Physical  Exam   GEN: Well nourished, well developed in no acute distress HEENT: Normal NECK: No JVD; No carotid bruits LYMPHATICS: No lymphadenopathy CARDIAC:RRR, no murmurs, rubs, gallops RESPIRATORY:  Clear to auscultation without rales, wheezing or rhonchi  ABDOMEN: Soft, non-tender, non-distended MUSCULOSKELETAL:  No edema; No deformity  SKIN: Warm and dry NEUROLOGIC:  Alert and oriented x 3 PSYCHIATRIC:  Normal affect    Labs    High Sensitivity Troponin:   Recent Labs  Lab 09/21/20 2051 09/21/20 2245  TROPONINIHS 109* 110*      Chemistry Recent Labs  Lab 09/23/20 0457 09/24/20 0323 09/25/20 0228  NA 130* 137 135  K 3.5 3.8 3.3*  CL 98 102 103  CO2 25 25 24   GLUCOSE 176* 155* 117*  BUN 11 9 7   CREATININE 0.81 0.87 0.70  CALCIUM 8.6* 9.5 8.8*  PROT  --   --  6.2*  ALBUMIN  --   --  2.9*  AST  --   --  13*  ALT  --   --  12  ALKPHOS  --   --  70  BILITOT  --   --  0.6  GFRNONAA >60 >60 >60  ANIONGAP 7 10 8      Hematology Recent Labs  Lab 09/23/20 1602 09/24/20 0323 09/25/20 0228  WBC 12.4* 11.6* 9.3  RBC 4.84 4.99 4.33  HGB 8.3* 8.6* 7.5*  HCT 30.0* 30.9* 26.9*  MCV 62.0* 61.9* 62.1*  MCH 17.1* 17.2* 17.3*  MCHC 27.7* 27.8* 27.9*  RDW 27.8* 28.5* 29.0*  PLT 477* 535* 428*    BNP Recent Labs  Lab 09/21/20 2051  BNP 297.0*     DDimer No results for input(s): DDIMER in the last 168 hours.   Radiology    CT CHEST ABDOMEN PELVIS W CONTRAST  Result Date: 09/24/2020 CLINICAL DATA:  Fungating ascending colonic mass on colonoscopy, suspect colon cancer EXAM: CT CHEST, ABDOMEN, AND PELVIS WITH CONTRAST TECHNIQUE: Multidetector CT imaging of the chest, abdomen and pelvis was performed following the standard protocol during bolus administration of intravenous contrast. CONTRAST:  149mL OMNIPAQUE IOHEXOL 300 MG/ML  SOLN COMPARISON:  09/21/2020 FINDINGS: CT CHEST FINDINGS Cardiovascular: The heart is unremarkable without pericardial effusion. Ascending  thoracic aortic aneurysm measuring 4.4 cm. No evidence of dissection. Minimal atherosclerosis of the distal aortic arch. Mediastinum/Nodes: No enlarged mediastinal, hilar, or axillary lymph nodes. Thyroid gland, trachea, and esophagus demonstrate no significant findings. Lungs/Pleura: There are small bilateral pleural effusions, with minimal dependent lower lobe atelectasis. Patchy ground-glass consolidation within the right upper lobe, suspect hypoventilatory changes or even mild aspiration given recent endoscopy. Inflammatory or infectious etiology could also be considered though less likely. No pneumothorax. The central airways are patent. Musculoskeletal: No acute or destructive bony lesions. CT ABDOMEN PELVIS FINDINGS Hepatobiliary: No focal liver abnormality. No evidence of metastatic disease. No biliary dilation. The gallbladder is unremarkable. Pancreas: Unremarkable. No pancreatic ductal dilatation or surrounding inflammatory changes. Spleen: Normal in size without focal abnormality. Adrenals/Urinary Tract: Right renal cysts are identified within the mid and lower poles. Subcentimeter hypodensities within the upper pole right kidney and throughout the left kidney are too small to characterize, but also likely reflect cysts. There are 2 small nonobstructing calculi within the lower pole left kidney, measuring up to 3 mm in size. No obstructive uropathy within either kidney. The adrenals and bladder are unremarkable. Stomach/Bowel: An eccentric intraluminal and transmural mass is seen along the anti mesenteric aspect of the proximal colon, measuring approximately 5.0 x 1.4 cm in transverse dimension, and extending approximately 3.7 cm in craniocaudal extent. Findings are compatible with the colon mass described on today's colonoscopy, highly suspicious for malignancy. The remainder of the bowel is unremarkable without obstruction or ileus. Normal appendix right lower quadrant. Vascular/Lymphatic: Mild  atherosclerosis throughout the aorta and iliac vessels. There are multiple small lymph nodes in the right lower quadrant mesentery, measuring up to 6 mm in size, nonspecific. No pathologic adenopathy within the abdomen or pelvis. Reproductive: Prostate is unremarkable. Other: Ventriculostomy catheter enters the abdomen in the right lower quadrant, tip within the left hemipelvis. No free fluid or free intraperitoneal gas. No abdominal wall hernia. Musculoskeletal: No acute or destructive bony lesions. IMPRESSION: 1. 5.0 x 1.4 x 3.7 cm eccentric intraluminal and transmural proximal colonic mass highly concerning for colon cancer. No evidence of obstruction. 2. Multiple subcentimeter lymph nodes within the right lower quadrant mesentery, nonspecific. No pathologically enlarged nodes. PET scan may be useful if there is concern for regional nodal metastases, though small size may limit evaluation. 3. Small bilateral pleural effusions. 4. Minimal ground-glass airspace disease within the right upper lobe, favor hypoventilatory change or aspiration  after upper endoscopy. 5. 4.4 cm ascending thoracic aortic aneurysm. Recommend annual imaging followup by CTA or MRA. This recommendation follows 2010 ACCF/AHA/AATS/ACR/ASA/SCA/SCAI/SIR/STS/SVM Guidelines for the Diagnosis and Management of Patients with Thoracic Aortic Disease. Circulation. 2010; 121: S601-V615. Aortic aneurysm NOS (ICD10-I71.9) 6.  Aortic Atherosclerosis (ICD10-I70.0). Electronically Signed   By: Randa Ngo M.D.   On: 09/24/2020 21:11    Cardiac Studies   2D echo 09/22/2020 IMPRESSIONS   1. Left ventricular ejection fraction by 3D volume is 56 %. The left  ventricle has normal function. The left ventricle demonstrates regional  wall motion abnormalities (see scoring diagram/findings for description).  Left ventricular diastolic parameters  are consistent with Grade II diastolic dysfunction (pseudonormalization).  2. Right ventricular systolic  function is normal. The right ventricular  size is normal. Tricuspid regurgitation signal is inadequate for assessing  PA pressure.  3. The mitral valve is normal in structure. Trivial mitral valve  regurgitation. No evidence of mitral stenosis.  4. The aortic valve is grossly normal. Aortic valve regurgitation is not  visualized. No aortic stenosis is present.  5. Aortic dilatation noted. There is mild dilatation of the aortic root,  measuring 40 mm.  6. The inferior vena cava is normal in size with greater than 50%  respiratory variability, suggesting right atrial pressure of 3 mmHg.   Patient Profile     51 y.o. male with a hx of seizure disorder, diabetes (found this admission), obesity who is being seen  for the evaluation of abnormal echocardiogram at the request of Irene Pap, MD.  Assessment & Plan    Abnormal EKG/Demand Ischemia -admitted with presumed GI bleed and severe anemia (Hbg 6.1) -Hemoccult is positive.  -Troponins were elevated but flat.   -His EKG demonstrates nonspecific ST-T changes. -echo demonstrates normal LV function with hypokinesis of the mid to apical anterior segment and apical septum.   -He has not had any CP -shortness of breath likely related to his anemia.  -he does have CVD risk factors including new onset diabetes which is just been found. -Overall, this does not represent an acute coronary syndrome.  This represents demand ischemia in the setting of severe anemia.   -suspect he likely has underlying obstructive disease in the mid LAD.  This would fit his wall motion abnormalities.   -given his severe anemia there is not a lot we can do about this currently.   -would not recommend aspirin or heparin given his life-threatening presumed GI bleed.   -EGD yesterday showed esophageal candidiasis and colonoscopy showed large fungating ascending colonic mass and surgery has been consulted -he has been started on high dose statin and LDL was 166 this  admit -repeat EKG after tachycardia resolved showed normal ST segements and no AS infarct.  -BB held for tachycardia as elevated HR is normal response to anemia -he will need ischemic workup prior to undergoing surgery for colon mass -coronary CTA is planned for today  Severe Anemia/Colonic mass -EGD showed esophageal candidiasis -colonoscopy with large fungating ascending colonic mass -surgery consulted -CEA elevated at 12.4 -Hbg 6.1 and up to 8.6 yesterday and now down to 7.5.  HLD -he has aortic atherosclerosis and small ascending aortic aneurysm on abdominal CTA -LDL goal < 70 -LDL was 166 and started on high dose statin -will need FLP and ALT in 6 weeks  I have spent a total of 30 minutes with patient reviewing 2D echo , telemetry, EKGs, labs and examining patient as well as establishing an assessment and  plan that was discussed with the patient.  > 50% of time was spent in direct patient care.      For questions or updates, please contact [place] Please consult [email or web address] for contact info under        Signed, Fransico Him, MD  09/25/2020, 9:22 AM

## 2020-09-25 NOTE — Progress Notes (Signed)
PT Cancellation/Discharge Note  Patient Details Name: TORAN MURCH MRN: [id number] DOB: [birth year]   Cancelled Treatment:    Reason Eval/Treat Not Completed: PT screened, no needs identified, will sign off  Patient up in chair on arrival. Refuses PT stating he has no trouble walking. Unable to persuade pt to participate.   Arby Barrette, PT Pager [phone]  Rexanne Mano 09/25/2020, 3:17 PM

## 2020-09-26 ENCOUNTER — Inpatient Hospital Stay (HOSPITAL_COMMUNITY): Payer: Medicare HMO

## 2020-09-26 ENCOUNTER — Encounter (HOSPITAL_COMMUNITY): Payer: Self-pay | Admitting: Gastroenterology

## 2020-09-26 ENCOUNTER — Other Ambulatory Visit (HOSPITAL_COMMUNITY): Payer: Self-pay | Admitting: Emergency Medicine

## 2020-09-26 DIAGNOSIS — R931 Abnormal findings on diagnostic imaging of heart and coronary circulation: Secondary | ICD-10-CM | POA: Diagnosis not present

## 2020-09-26 DIAGNOSIS — E7800 Pure hypercholesterolemia, unspecified: Secondary | ICD-10-CM | POA: Diagnosis not present

## 2020-09-26 DIAGNOSIS — D649 Anemia, unspecified: Secondary | ICD-10-CM | POA: Diagnosis not present

## 2020-09-26 DIAGNOSIS — R778 Other specified abnormalities of plasma proteins: Secondary | ICD-10-CM | POA: Diagnosis not present

## 2020-09-26 LAB — CBC
HCT: 36.4 % — ABNORMAL LOW (ref 39.0–52.0)
Hemoglobin: 10.6 g/dL — ABNORMAL LOW (ref 13.0–17.0)
MCH: 19.3 pg — ABNORMAL LOW (ref 26.0–34.0)
MCHC: 29.1 g/dL — ABNORMAL LOW (ref 30.0–36.0)
MCV: 66.3 fL — ABNORMAL LOW (ref 80.0–100.0)
Platelets: 411 10*3/uL — ABNORMAL HIGH (ref 150–400)
RBC: 5.49 MIL/uL (ref 4.22–5.81)
RDW: 30 % — ABNORMAL HIGH (ref 11.5–15.5)
WBC: 10 10*3/uL (ref 4.0–10.5)
nRBC: 0 % (ref 0.0–0.2)

## 2020-09-26 LAB — BPAM RBC
Blood Product Expiration Date: 202204092359
Blood Product Expiration Date: 202204102359
ISSUE DATE / TIME: 202203151812
ISSUE DATE / TIME: 202203152210
Unit Type and Rh: 6200
Unit Type and Rh: 6200

## 2020-09-26 LAB — MAGNESIUM: Magnesium: 1.9 mg/dL (ref 1.7–2.4)

## 2020-09-26 LAB — GLUCOSE, CAPILLARY
Glucose-Capillary: 118 mg/dL — ABNORMAL HIGH (ref 70–99)
Glucose-Capillary: 119 mg/dL — ABNORMAL HIGH (ref 70–99)
Glucose-Capillary: 103 mg/dL — ABNORMAL HIGH (ref 70–99)
Glucose-Capillary: 168 mg/dL — ABNORMAL HIGH (ref 70–99)
Glucose-Capillary: 98 mg/dL (ref 70–99)

## 2020-09-26 LAB — BASIC METABOLIC PANEL
Anion gap: 9 (ref 5–15)
BUN: <5 mg/dL — ABNORMAL LOW (ref 6–20)
CO2: 26 mmol/L (ref 22–32)
Calcium: 9.2 mg/dL (ref 8.9–10.3)
Chloride: 103 mmol/L (ref 98–111)
Creatinine, Ser: 0.78 mg/dL (ref 0.61–1.24)
GFR, Estimated: >60 mL/min (ref >60)
Glucose, Bld: 120 mg/dL — ABNORMAL HIGH (ref 70–99)
Potassium: 3.8 mmol/L (ref 3.5–5.1)
Sodium: 138 mmol/L (ref 135–145)

## 2020-09-26 LAB — PROTIME-INR
INR: 1.1 (ref 0.8–1.2)
Prothrombin Time: 14.1 seconds (ref 11.4–15.2)

## 2020-09-26 LAB — TYPE AND SCREEN
ABO/RH(D): A POS
Antibody Screen: NEGATIVE
Unit division: 0
Unit division: 0

## 2020-09-26 LAB — SURGICAL PATHOLOGY

## 2020-09-26 NOTE — Progress Notes (Signed)
OT Cancellation Note  Patient Details Name: Jeff Kelley MRN: [id number] DOB: 02/02/1971   Cancelled Treatment:    Reason Eval/Treat Not Completed: Patient declined, no reason specified (Pt reports that he has been ad lib in room with no deficits and plans to continue doing so. Pt reports that he feels better and does not need OT at this time. OT signing off. Pt's needs in reach.)   Jefferey Pica, OTR/L [place] Pager: [phone] Office: [phone]   ALLISON C 09/26/2020, 11:17 AM

## 2020-09-26 NOTE — Plan of Care (Signed)

## 2020-09-26 NOTE — Progress Notes (Signed)
PROGRESS NOTE    Jeff Kelley  ZHG:[id number] DOB: [birth year] DOA: 09/21/2020 PCP: Patient, No Pcp Per   Brief Narrative:  50 y.o.malewith medical history significant forseizure disorder and anxiety, now presenting to the emergency department for evaluation of shortness of breath. Patient began to notice shortness of breath with exertion approximately 1 month ago, slowly progressed to the point where he was dyspneic at rest.  Upon admission patient was noted to be anemic with hemoglobin of 6.1 requiring transfusion.  Underwent EGD/colonoscopy in 09/24/2020. Discussed with GI Dr. Collene Mares findings concerning for esophageal candidiasis and a large mass in the colon.  CEA ordered and pending.  Will obtain CT chest/abdomen/pelvis with contrast for staging.  Started gentle IV fluid hydration to prevent contrast-induced nephropathy.  General surgery has been consulted by GI.   Assessment & Plan:   Principal Problem:   Symptomatic anemia Active Problems:   Seizures (HCC)   Occult GI bleeding   Elevated troponin   CHF (congestive heart failure) (HCC)   Hyperglycemia   Iron deficiency anemia   Hyponatremia   Abnormal echocardiogram   Symptomatic iron deficiency anemia secondary to large malignant colonic mass Newly diagnosed colon mass seen on colonoscopy done on 09/24/2020 by Dr. 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 -General surgery consulted.  Will likely need surgical resection. -Spoke with Dr. Earlie Server from oncology who will arrange for outpatient follow-up with Dr. Learta Codding after surgery in a few weeks.   Esophageal Candida Started p.o. Diflucan  100 mg daily x7 days.  Last day 3/20  Newly diagnosed ascending aortic aneurysm measuring 4.4 cm Patient made aware of this finding and of recommendations.  ACCF/AHA guidelines. Recommended annual imaging follow-up by CTA or MRA.  Severe symptomatic iron deficiency anemia with positive FOBT likely secondary to chronic blood loss in the setting of large malignant colon mass. Admission hemoglobin 6.1 status post PRBC transfusion.  Hemoglobin is stable for now.  Elevated troponin likely secondary to demand ischemia in the setting of severe anemia Diastolic CHF/mid and distal anterior septum, apical anterior segments are hypokinetic. Overall echocardiogram is unremarkable.  CT coronary showed elevated calcium scores.  Awaiting FFR results Started on Lipitor 2D echo showing LVEF 56% left ventricle demonstrate regional wall motion abnormalities, left ventricle diastolic parameters are consistent with grade 2 diastolic dysfunction. Cardiology following.   Type 2 diabetes with hyperglycemia Hemoglobin A1c 9.6 on 09/30/2020 Continue insulin sliding scale. Continue to monitor CBGs  Seizure disorder Continue home regimen. Seizures precautions.  Chronic anxiety Continue home regimen.    DVT prophylaxis: SCD's Start: 09/25/20 1413 SCDs Start: 09/22/20 0020  Code Status: Full code Family Communication:    Status is: Inpatient  Remains inpatient appropriate because:Inpatient level of care appropriate due to severity of illness   Dispo:  Patient From: Home  Planned Disposition: Home  Medically stable for discharge: No      Body mass index is 31.1 kg/m.       Subjective: Feels ok, no complaints. Awaiting his Sx.   I offered him to speak with his family but he denied.  Review of Systems Otherwise negative except as per HPI, including: General: Denies fever, chills, night sweats or unintended weight loss. Resp: Denies cough, wheezing, shortness of breath.  Cardiac:  Denies chest pain, palpitations, orthopnea, paroxysmal nocturnal dyspnea. GI: Denies abdominal pain, nausea, vomiting, diarrhea or constipation GU: Denies dysuria, frequency, hesitancy or incontinence MS: Denies muscle aches, joint pain or swelling Neuro: Denies headache, neurologic deficits (focal weakness, numbness, tingling), abnormal gait Psych: Denies anxiety, depression, SI/HI/AVH Skin: Denies new rashes or lesions ID: Denies sick contacts, exotic exposures, travel  Examination:  General exam: Appears calm and comfortable  Respiratory system: Clear to auscultation. Respiratory effort normal. Cardiovascular system: S1 & S2 heard, RRR. No JVD, murmurs, rubs, gallops or clicks. No pedal edema. Gastrointestinal system: Abdomen is nondistended, soft and nontender. No organomegaly or masses felt. Normal bowel sounds heard. Central nervous system: Alert and oriented. No focal neurological deficits. Extremities: Symmetric 5 x 5 power. Skin: No rashes, lesions or ulcers Psychiatry: Judgement and insight appear normal. Mood & affect appropriate.     Objective: Vitals:   09/26/20 0500 09/26/20 0537 09/26/20 1031 09/26/20 1320  BP:  140/89 (!) 140/97 138/88  Pulse:  88  82  Resp:  18 20 18   Temp:  98.7 F (37.1 C) 97.8 F (36.6 C) 97.8 F (36.6 C)  TempSrc:   Oral Oral  SpO2:  93% 97% 100%  Weight: 87.4 kg     Height:        Intake/Output Summary (Last 24 hours) at 09/26/2020 1411 Last data filed at 09/26/2020 0245 Gross per 24 hour  Intake 852.5 ml  Output -  Net 852.5 ml   Filed Weights   09/22/20 0321 09/23/20 0830 09/26/20 0500  Weight: 91.6 kg 86.9 kg 87.4 kg     Data Reviewed:   CBC: Recent Labs  Lab 09/23/20 0457 09/23/20 1602 09/24/20 0323 09/25/20 0228 09/26/20 0555  WBC 13.2* 12.4* 11.6* 9.3 10.0  HGB 7.7* 8.3* 8.6* 7.5* 10.6*  HCT 27.5* 30.0* 30.9* 26.9* 36.4*  MCV 61.0* 62.0* 61.9* 62.1* 66.3*  PLT 482* 477* 535* 428* 096*   Basic Metabolic  Panel: Recent Labs  Lab 09/23/20 0457 09/24/20 0323 09/25/20 0228 09/25/20 1647 09/26/20 0555  NA 130* 137 135 132* 138  K 3.5 3.8 3.3* 3.8 3.8  CL 98 102 103 100 103  CO2 25 25 24 22 26   GLUCOSE 176* 155* 117* 126* 120*  BUN 11 9 7  5* <5*  CREATININE 0.81 0.87 0.70 0.71 0.78  CALCIUM 8.6* 9.5 8.8* 8.9 9.2  MG  --   --  2.0 1.9 1.9  PHOS  --   --   --  3.4  --    GFR: Estimated Creatinine Clearance: 114.4 mL/min (by C-G formula based on SCr of 0.78 mg/dL). Liver Function Tests: Recent Labs  Lab 09/25/20 0228  AST 13*  ALT 12  ALKPHOS 70  BILITOT 0.6  PROT 6.2*  ALBUMIN 2.9*   No results for input(s): LIPASE, AMYLASE in the last 168 hours. No results for input(s): AMMONIA in the last 168 hours. Coagulation Profile: Recent Labs  Lab 09/26/20 0555  INR 1.1   Cardiac Enzymes: No results for input(s): CKTOTAL, CKMB, CKMBINDEX, TROPONINI in the last 168 hours. BNP (last 3 results) No results for input(s): PROBNP in the last 8760 hours. HbA1C: No results for input(s): HGBA1C in the last 72 hours. CBG: Recent Labs  Lab 09/25/20 2045 09/25/20 2345 09/26/20 0341 09/26/20 0706 09/26/20 1145  GLUCAP 147* 108* 118* 119* 103*   Lipid Profile: Recent Labs    09/24/20 0323  CHOL 221*  HDL 39*  LDLCALC 166*  TRIG 79  CHOLHDL 5.7   Thyroid Function Tests: Recent Labs    09/23/20 1602  TSH 2.441   Anemia Panel: No results for input(s): VITAMINB12, FOLATE, FERRITIN, TIBC, IRON, RETICCTPCT in the last 72 hours. Sepsis Labs: No results for input(s): PROCALCITON, LATICACIDVEN in the last 168 hours.  Recent Results (from the past 240 hour(s))  SARS CORONAVIRUS 2 (TAT 6-24 HRS) Nasopharyngeal Nasopharyngeal Swab     Status: None   Collection Time: 09/21/20  8:45 PM   Specimen: Nasopharyngeal Swab  Result Value Ref Range Status   SARS Coronavirus 2 NEGATIVE NEGATIVE Final    Comment: (NOTE) SARS-CoV-2 target nucleic acids are NOT DETECTED.  The SARS-CoV-2  RNA is generally detectable in upper and lower respiratory specimens during the acute phase of infection. Negative results do not preclude SARS-CoV-2 infection, do not rule out co-infections with other pathogens, and should not be used as the sole basis for treatment or other patient management decisions. Negative results must be combined with clinical observations, patient history, and epidemiological information. The expected result is Negative.  Fact Sheet for Patients: SugarRoll.be  Fact Sheet for Healthcare Providers: https://www.woods-mathews.com/  This test is not yet approved or cleared by the [place] FDA and  has been authorized for detection and/or diagnosis of SARS-CoV-2 by FDA under an Emergency Use Authorization (EUA). This EUA will remain  in effect (meaning this test can be used) for the duration of the COVID-19 declaration under Se ction 564(b)(1) of the Act, 21 U.S.C. section 360bbb-3(b)(1), unless the authorization is terminated or revoked sooner.  Performed at [facility], [place] [address], [place], [place] [address]   MRSA PCR Screening     Status: None   Collection Time: 09/22/20  4:25 AM   Specimen: Nasopharyngeal  Result Value Ref Range Status   MRSA by PCR NEGATIVE NEGATIVE Final    Comment:        The GeneXpert MRSA Assay (FDA approved for NASAL specimens only), is one component of a comprehensive MRSA colonization surveillance program. It is not intended to diagnose MRSA infection nor to guide or monitor treatment for MRSA infections. Performed at [facility], [place] [address], [place], [place] [address]          Radiology Studies: CT CHEST ABDOMEN PELVIS W CONTRAST  Result Date: 09/24/2020 CLINICAL DATA:  Fungating ascending colonic mass on colonoscopy, suspect colon cancer EXAM: CT CHEST, ABDOMEN, AND PELVIS WITH CONTRAST TECHNIQUE: Multidetector CT imaging of the chest, abdomen and  pelvis was performed following the standard protocol during bolus administration of intravenous contrast. CONTRAST:  185mL OMNIPAQUE IOHEXOL 300 MG/ML  SOLN COMPARISON:  09/21/2020 FINDINGS: CT CHEST FINDINGS Cardiovascular: The heart is unremarkable without pericardial effusion. Ascending thoracic aortic aneurysm measuring 4.4 cm. No evidence of dissection. Minimal atherosclerosis of the distal aortic arch. Mediastinum/Nodes: No enlarged mediastinal, hilar, or axillary lymph nodes. Thyroid gland, trachea, and esophagus demonstrate no significant findings. Lungs/Pleura: There are small bilateral pleural effusions, with minimal dependent lower lobe atelectasis. Patchy ground-glass consolidation within the right upper lobe, suspect hypoventilatory changes or even mild aspiration given recent endoscopy. Inflammatory or infectious etiology could also be considered though less likely. No pneumothorax. The central airways are patent. Musculoskeletal: No acute or destructive bony lesions. CT ABDOMEN PELVIS FINDINGS Hepatobiliary: No focal liver abnormality. No evidence of metastatic disease. No biliary dilation. The gallbladder is unremarkable. Pancreas: Unremarkable. No pancreatic ductal dilatation or surrounding inflammatory changes. Spleen: Normal in size without focal abnormality. Adrenals/Urinary Tract: Right renal  cysts are identified within the mid and lower poles. Subcentimeter hypodensities within the upper pole right kidney and throughout the left kidney are too small to characterize, but also likely reflect cysts. There are 2 small nonobstructing calculi within the lower pole left kidney, measuring up to 3 mm in size. No obstructive uropathy within either kidney. The adrenals and bladder are unremarkable. Stomach/Bowel: An eccentric intraluminal and transmural mass is seen along the anti mesenteric aspect of the proximal colon, measuring approximately 5.0 x 1.4 cm in transverse dimension, and extending  approximately 3.7 cm in craniocaudal extent. Findings are compatible with the colon mass described on today's colonoscopy, highly suspicious for malignancy. The remainder of the bowel is unremarkable without obstruction or ileus. Normal appendix right lower quadrant. Vascular/Lymphatic: Mild atherosclerosis throughout the aorta and iliac vessels. There are multiple small lymph nodes in the right lower quadrant mesentery, measuring up to 6 mm in size, nonspecific. No pathologic adenopathy within the abdomen or pelvis. Reproductive: Prostate is unremarkable. Other: Ventriculostomy catheter enters the abdomen in the right lower quadrant, tip within the left hemipelvis. No free fluid or free intraperitoneal gas. No abdominal wall hernia. Musculoskeletal: No acute or destructive bony lesions. IMPRESSION: 1. 5.0 x 1.4 x 3.7 cm eccentric intraluminal and transmural proximal colonic mass highly concerning for colon cancer. No evidence of obstruction. 2. Multiple subcentimeter lymph nodes within the right lower quadrant mesentery, nonspecific. No pathologically enlarged nodes. PET scan may be useful if there is concern for regional nodal metastases, though small size may limit evaluation. 3. Small bilateral pleural effusions. 4. Minimal ground-glass airspace disease within the right upper lobe, favor hypoventilatory change or aspiration after upper endoscopy. 5. 4.4 cm ascending thoracic aortic aneurysm. Recommend annual imaging followup by CTA or MRA. This recommendation follows 2010 ACCF/AHA/AATS/ACR/ASA/SCA/SCAI/SIR/STS/SVM Guidelines for the Diagnosis and Management of Patients with Thoracic Aortic Disease. Circulation. 2010; 121: P619-J093. Aortic aneurysm NOS (ICD10-I71.9) 6.  Aortic Atherosclerosis (ICD10-I70.0). Electronically Signed   By: Randa Ngo M.D.   On: 09/24/2020 21:11   CT CORONARY MORPH W/CTA COR W/SCORE W/CA W/CM &/OR WO/CM  Addendum Date: 09/25/2020   ADDENDUM REPORT: 09/25/2020 15:25 EXAM:  Cardiac/Coronary  CT TECHNIQUE: The patient was scanned on a [employer]. FINDINGS: A 120 kV prospective scan was triggered in the descending thoracic aorta at 111 HU's. Axial non-contrast 3 mm slices were carried out through the heart. The data set was analyzed on a dedicated work station and scored using the [place]. Gantry rotation speed was 250 msecs and collimation was .6 mm. No beta blockade and 0.8 mg of sl NTG was given. The 3D data set was reconstructed in 5% intervals of the 67-82 % of the R-R cycle. Diastolic phases were analyzed on a dedicated work station using MPR, MIP and VRT modes. The patient received 80 cc of contrast. Aorta: Mildly dilated ascending aorta at 54mm (measured at the bifurcation of the main pulmonary artery). No calcifications. No dissection. Aortic Valve:  Trileaflet.  Scattered calcifications. Coronary Arteries:  Normal coronary origin.  Right dominance. RCA is a large dominant artery that gives rise to PDA and PLVB. There is mild calcified plaque in the proximal and mid RCA with associated stenosis of 25-49%. There is moderate to severe non calcified plaque in the mid to distal RCA with associated stenosis of at least 50-69% but could be higher than 70%. Study is limited by noise artifact. Left main is a large artery that gives rise to LAD and LCX arteries. There  is no plaque. LAD is a large vessel that gives rise to a large trifurcating diagonal and a moderate sized D2. There is a high grade complex long severe mixed plaque in the mid LAD after the takeoff of a large diagonal with associated stenosis of 70-99%. The D1 has calcified plaque in the ostium that is at least 25-49% and possible non calcified plaque in the mid D1 but noise artifact prohibits accurate quantification. LCX is a non-dominant small artery that gives rise to one small OM1 branch. Only the proximal LCx is adequately visualized and there is no plaque. Other findings: Normal pulmonary vein  drainage into the left atrium. Normal let atrial appendage without a thrombus. Normal size of the pulmonary artery. IMPRESSION: 1. Coronary calcium score of 192. This was 94th percentile for age and sex matched control. 2.  Normal coronary origin with right dominance. 3.  Severe atherosclerosis.  CAD RADS 4a. 4. Consider symptom-guided anti-ischemic and preventive pharmacotherapy as well as risk factor modification per guideline-directed care. 5.  Consider cardiac catheterization. 6.  This study has been submitted for FFR flow analysis. Fransico Him Electronically Signed   By: Fransico Him   On: 09/25/2020 15:25   Result Date: 09/25/2020 EXAM: OVER-READ INTERPRETATION  CT CHEST The following report is an over-read performed by radiologist Dr. Vinnie Langton of [facility], [place] [employer] on 09/25/2020. This over-read does not include interpretation of cardiac or coronary anatomy or pathology. The coronary calcium score/coronary CTA interpretation by the cardiologist is attached. COMPARISON:  None. FINDINGS: Aortic atherosclerosis with ectasia of the ascending thoracic aorta (4.0 cm in diameter). Trace bilateral pleural effusions lying dependently with associated areas of subsegmental atelectasis in the dependent portions of the lower lobes of the lungs bilaterally. Within the visualized portions of the thorax there are no suspicious appearing pulmonary nodules or masses, there is no acute consolidative airspace disease, no pneumothorax and no lymphadenopathy. Visualized portions of the upper abdomen are unremarkable. There are no aggressive appearing lytic or blastic lesions noted in the visualized portions of the skeleton. IMPRESSION: 1. Trace bilateral pleural effusions lying dependently with some associated passive subsegmental atelectasis in the lower lobes of the lungs bilaterally. 2.  Aortic Atherosclerosis (ICD10-I70.0). 3. Ectasia of the ascending thoracic aorta (4.0 cm in diameter). Recommend annual imaging  followup by CTA or MRA. This recommendation follows 2010 ACCF/AHA/AATS/ACR/ASA/SCA/SCAI/SIR/STS/SVM Guidelines for the Diagnosis and Management of Patients with Thoracic Aortic Disease. Circulation. 2010; 121: W413-K440. Aortic aneurysm NOS (ICD10-I71.9). Electronically Signed: By: Vinnie Langton M.D. On: 09/25/2020 11:20        Scheduled Meds: . alvimopan  12 mg Oral On Call to OR  . atorvastatin  80 mg Oral Daily  . Cenobamate  100 mg Oral Daily  . feeding supplement  1 Container Oral TID BM  . ferrous sulfate  325 mg Oral Q breakfast  . fluconazole  100 mg Oral Daily  . insulin aspart  0-6 Units Subcutaneous Q4H  . lamoTRIgine  400 mg Oral BID  . levETIRAcetam  1,125 mg Oral BID  . LORazepam  1 mg Oral BID  . pantoprazole  40 mg Oral Daily  . PHENobarbital  64.8 mg Oral BID   Continuous Infusions: . sodium chloride    . sodium chloride    . cefoTEtan (CEFOTAN) IV       LOS: 5 days   Time spent= 35 mins    Ankit Arsenio Loader, MD [facility]  If 7PM-7AM, please contact night-coverage  09/26/2020, 2:11 PM

## 2020-09-26 NOTE — Progress Notes (Addendum)
[facility] Progress Note  2 Days Post-Op  Subjective: CC-  Up in chair this morning. States that he had the best sleep he's had in a while last night. Denies abdominal pain, nausea, vomiting. States that he passed more watery stool yesterday. Pathology still pending.  Objective: Vital signs in last 24 hours: Temp:  [98 F (36.7 C)-98.8 F (37.1 C)] 98.7 F (37.1 C) (03/16 0537) Pulse Rate:  [85-90] 88 (03/16 0537) Resp:  [17-18] 18 (03/16 0537) BP: (103-140)/(67-96) 140/89 (03/16 0537) SpO2:  [93 %-100 %] 93 % (03/16 0537) Weight:  [87.4 kg] 87.4 kg (03/16 0500) Last BM Date: 09/26/20  Intake/Output from previous day: 03/15 0701 - 03/16 0700 In: 852.5 [Blood:852.5] Out: -  Intake/Output this shift: No intake/output data recorded.  PE: Gen:  Alert, NAD, pleasant HEENT: EOM's intact, pupils equal and round Card:  RRR Pulm:  CTAB, no W/R/R, rate and effort normal Abd: Soft, NT/ND, +BS, no HSM Psych: A&Ox4  Skin: no rashes noted, warm and dry  Lab Results:  Recent Labs    09/25/20 0228 09/26/20 0555  WBC 9.3 10.0  HGB 7.5* 10.6*  HCT 26.9* 36.4*  PLT 428* 411*   BMET Recent Labs    09/25/20 1647 09/26/20 0555  NA 132* 138  K 3.8 3.8  CL 100 103  CO2 22 26  GLUCOSE 126* 120*  BUN 5* <5*  CREATININE 0.71 0.78  CALCIUM 8.9 9.2   PT/INR Recent Labs    09/26/20 0555  LABPROT 14.1  INR 1.1   CMP     Component Value Date/Time   NA 138 09/26/2020 0555   K 3.8 09/26/2020 0555   CL 103 09/26/2020 0555   CO2 26 09/26/2020 0555   GLUCOSE 120 (H) 09/26/2020 0555   BUN <5 (L) 09/26/2020 0555   CREATININE 0.78 09/26/2020 0555   CALCIUM 9.2 09/26/2020 0555   PROT 6.2 (L) 09/25/2020 0228   ALBUMIN 2.9 (L) 09/25/2020 0228   AST 13 (L) 09/25/2020 0228   ALT 12 09/25/2020 0228   ALKPHOS 70 09/25/2020 0228   BILITOT 0.6 09/25/2020 0228   GFRNONAA >60 09/26/2020 0555   Lipase  No results found for: LIPASE     Studies/Results: CT CHEST  ABDOMEN PELVIS W CONTRAST  Result Date: 09/24/2020 CLINICAL DATA:  Fungating ascending colonic mass on colonoscopy, suspect colon cancer EXAM: CT CHEST, ABDOMEN, AND PELVIS WITH CONTRAST TECHNIQUE: Multidetector CT imaging of the chest, abdomen and pelvis was performed following the standard protocol during bolus administration of intravenous contrast. CONTRAST:  184mL OMNIPAQUE IOHEXOL 300 MG/ML  SOLN COMPARISON:  09/21/2020 FINDINGS: CT CHEST FINDINGS Cardiovascular: The heart is unremarkable without pericardial effusion. Ascending thoracic aortic aneurysm measuring 4.4 cm. No evidence of dissection. Minimal atherosclerosis of the distal aortic arch. Mediastinum/Nodes: No enlarged mediastinal, hilar, or axillary lymph nodes. Thyroid gland, trachea, and esophagus demonstrate no significant findings. Lungs/Pleura: There are small bilateral pleural effusions, with minimal dependent lower lobe atelectasis. Patchy ground-glass consolidation within the right upper lobe, suspect hypoventilatory changes or even mild aspiration given recent endoscopy. Inflammatory or infectious etiology could also be considered though less likely. No pneumothorax. The central airways are patent. Musculoskeletal: No acute or destructive bony lesions. CT ABDOMEN PELVIS FINDINGS Hepatobiliary: No focal liver abnormality. No evidence of metastatic disease. No biliary dilation. The gallbladder is unremarkable. Pancreas: Unremarkable. No pancreatic ductal dilatation or surrounding inflammatory changes. Spleen: Normal in size without focal abnormality. Adrenals/Urinary Tract: Right renal cysts are identified within the mid  and lower poles. Subcentimeter hypodensities within the upper pole right kidney and throughout the left kidney are too small to characterize, but also likely reflect cysts. There are 2 small nonobstructing calculi within the lower pole left kidney, measuring up to 3 mm in size. No obstructive uropathy within either kidney.  The adrenals and bladder are unremarkable. Stomach/Bowel: An eccentric intraluminal and transmural mass is seen along the anti mesenteric aspect of the proximal colon, measuring approximately 5.0 x 1.4 cm in transverse dimension, and extending approximately 3.7 cm in craniocaudal extent. Findings are compatible with the colon mass described on today's colonoscopy, highly suspicious for malignancy. The remainder of the bowel is unremarkable without obstruction or ileus. Normal appendix right lower quadrant. Vascular/Lymphatic: Mild atherosclerosis throughout the aorta and iliac vessels. There are multiple small lymph nodes in the right lower quadrant mesentery, measuring up to 6 mm in size, nonspecific. No pathologic adenopathy within the abdomen or pelvis. Reproductive: Prostate is unremarkable. Other: Ventriculostomy catheter enters the abdomen in the right lower quadrant, tip within the left hemipelvis. No free fluid or free intraperitoneal gas. No abdominal wall hernia. Musculoskeletal: No acute or destructive bony lesions. IMPRESSION: 1. 5.0 x 1.4 x 3.7 cm eccentric intraluminal and transmural proximal colonic mass highly concerning for colon cancer. No evidence of obstruction. 2. Multiple subcentimeter lymph nodes within the right lower quadrant mesentery, nonspecific. No pathologically enlarged nodes. PET scan may be useful if there is concern for regional nodal metastases, though small size may limit evaluation. 3. Small bilateral pleural effusions. 4. Minimal ground-glass airspace disease within the right upper lobe, favor hypoventilatory change or aspiration after upper endoscopy. 5. 4.4 cm ascending thoracic aortic aneurysm. Recommend annual imaging followup by CTA or MRA. This recommendation follows 2010 ACCF/AHA/AATS/ACR/ASA/SCA/SCAI/SIR/STS/SVM Guidelines for the Diagnosis and Management of Patients with Thoracic Aortic Disease. Circulation. 2010; 121: H086-V784. Aortic aneurysm NOS (ICD10-I71.9) 6.   Aortic Atherosclerosis (ICD10-I70.0). Electronically Signed   By: Randa Ngo M.D.   On: 09/24/2020 21:11   CT CORONARY MORPH W/CTA COR W/SCORE W/CA W/CM &/OR WO/CM  Addendum Date: 09/25/2020   ADDENDUM REPORT: 09/25/2020 15:25 EXAM: Cardiac/Coronary  CT TECHNIQUE: The patient was scanned on a [employer]. FINDINGS: A 120 kV prospective scan was triggered in the descending thoracic aorta at 111 HU's. Axial non-contrast 3 mm slices were carried out through the heart. The data set was analyzed on a dedicated work station and scored using the [place]. Gantry rotation speed was 250 msecs and collimation was .6 mm. No beta blockade and 0.8 mg of sl NTG was given. The 3D data set was reconstructed in 5% intervals of the 67-82 % of the R-R cycle. Diastolic phases were analyzed on a dedicated work station using MPR, MIP and VRT modes. The patient received 80 cc of contrast. Aorta: Mildly dilated ascending aorta at 67mm (measured at the bifurcation of the main pulmonary artery). No calcifications. No dissection. Aortic Valve:  Trileaflet.  Scattered calcifications. Coronary Arteries:  Normal coronary origin.  Right dominance. RCA is a large dominant artery that gives rise to PDA and PLVB. There is mild calcified plaque in the proximal and mid RCA with associated stenosis of 25-49%. There is moderate to severe non calcified plaque in the mid to distal RCA with associated stenosis of at least 50-69% but could be higher than 70%. Study is limited by noise artifact. Left main is a large artery that gives rise to LAD and LCX arteries. There is no plaque. LAD is a  large vessel that gives rise to a large trifurcating diagonal and a moderate sized D2. There is a high grade complex long severe mixed plaque in the mid LAD after the takeoff of a large diagonal with associated stenosis of 70-99%. The D1 has calcified plaque in the ostium that is at least 25-49% and possible non calcified plaque in the mid D1 but  noise artifact prohibits accurate quantification. LCX is a non-dominant small artery that gives rise to one small OM1 branch. Only the proximal LCx is adequately visualized and there is no plaque. Other findings: Normal pulmonary vein drainage into the left atrium. Normal let atrial appendage without a thrombus. Normal size of the pulmonary artery. IMPRESSION: 1. Coronary calcium score of 192. This was 94th percentile for age and sex matched control. 2.  Normal coronary origin with right dominance. 3.  Severe atherosclerosis.  CAD RADS 4a. 4. Consider symptom-guided anti-ischemic and preventive pharmacotherapy as well as risk factor modification per guideline-directed care. 5.  Consider cardiac catheterization. 6.  This study has been submitted for FFR flow analysis. Fransico Him Electronically Signed   By: Fransico Him   On: 09/25/2020 15:25   Result Date: 09/25/2020 EXAM: OVER-READ INTERPRETATION  CT CHEST The following report is an over-read performed by radiologist Dr. Vinnie Langton of [facility], [place] on 09/25/2020. This over-read does not include interpretation of cardiac or coronary anatomy or pathology. The coronary calcium score/coronary CTA interpretation by the cardiologist is attached. COMPARISON:  None. FINDINGS: Aortic atherosclerosis with ectasia of the ascending thoracic aorta (4.0 cm in diameter). Trace bilateral pleural effusions lying dependently with associated areas of subsegmental atelectasis in the dependent portions of the lower lobes of the lungs bilaterally. Within the visualized portions of the thorax there are no suspicious appearing pulmonary nodules or masses, there is no acute consolidative airspace disease, no pneumothorax and no lymphadenopathy. Visualized portions of the upper abdomen are unremarkable. There are no aggressive appearing lytic or blastic lesions noted in the visualized portions of the skeleton. IMPRESSION: 1. Trace bilateral pleural effusions lying  dependently with some associated passive subsegmental atelectasis in the lower lobes of the lungs bilaterally. 2.  Aortic Atherosclerosis (ICD10-I70.0). 3. Ectasia of the ascending thoracic aorta (4.0 cm in diameter). Recommend annual imaging followup by CTA or MRA. This recommendation follows 2010 ACCF/AHA/AATS/ACR/ASA/SCA/SCAI/SIR/STS/SVM Guidelines for the Diagnosis and Management of Patients with Thoracic Aortic Disease. Circulation. 2010; 121: B449-Q759. Aortic aneurysm NOS (ICD10-I71.9). Electronically Signed: By: Vinnie Langton M.D. On: 09/25/2020 11:20    Anti-infectives: Anti-infectives (From admission, onward)   Start     Dose/Rate Route Frequency Ordered Stop   09/26/20 0600  cefoTEtan (CEFOTAN) 2 g in sodium chloride 0.9 % 100 mL IVPB        2 g 200 mL/hr over 30 Minutes Intravenous On call to O.R. 09/25/20 1412 09/27/20 0559   09/24/20 1600  fluconazole (DIFLUCAN) tablet 100 mg        100 mg Oral Daily 09/24/20 1552 10/01/20 0959       Assessment/Plan Hx of Seizures 4.4 cm ascending thoracic aortic aneurysm VP shunt in place HLD DM - Hbg A1c 9.6 Elevated Tn on admission - felt to be demand ischemia. Cardiology completing ischemic work up, coronary FFR today: "if coronary FFR shows reduced flow then he will be at high risk of perioperative MI with colon resection.  Given his GI bleed with Hbg as low at 6.1, he is not a candidate for DAPT with PCI.  He has not  had any chest pin despite significant CAD in setting of severe anemia.  Will discuss with interventional colleagues" CAD Severe atherosclerosis  Esophageal plaques - noted on endoscopy, brushing taken by Dr. Collene Mares ABL anemia - likely 2/2 GI loss. 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 This also showed a 6 mm sessile polyp in the sigmoid colon and a 10 mm  sessile polyp at the hepatic flexure that were retrieved with snare.  - Await pathology of bx's  - CT CAP ordered and showed 5.0 x 1.4 x 3.7 cm eccentric intraluminal and transmural proximal colonic mass highly concerning for colon cancer, no evidence of obstruction; multiple subcentimeter lymph nodes within the right lower quadrant mesentery - CEA 12.4  FEN - NPO for possible procedure VTE - SCDs ID - [email or web address] 3/14>>  Plan: Keep NPO for now. Follow up cardiology recommendations. Also awaiting final pathology from colonoscopy prior to surgery as this could change the procedure - hopefully this will come back today.    LOS: 5 days    [facility] 09/26/2020, 8:23 AM Please see Amion for pager number during day hours 7:00am-4:30pm

## 2020-09-26 NOTE — Progress Notes (Signed)
Progress Note  Patient Name: Jeff Kelley Date of Encounter: 09/26/2020  [facility] Cardiologist: Evalina Field, MD   Subjective   Admitted with SOB and found to be severely anemic with Hbg 6.1. Initial hsTrop 109>>110.  EKG abnormal on admit with ? AS MI and nonspecific ST/T wave abnormality and 2D echo with normal LVF and HK of mid to distal AS and apical anterior wall.   EGD with possible candidiasis.  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 Surgery consulted but have not seen yet.  Coronary CTA showed possible high grade lesion in LAD and distal RCA.  Inpatient Medications    Scheduled Meds: . alvimopan  12 mg Oral On Call to OR  . atorvastatin  80 mg Oral Daily  . Cenobamate  100 mg Oral Daily  . feeding supplement  1 Container Oral TID BM  . ferrous sulfate  325 mg Oral Q breakfast  . fluconazole  100 mg Oral Daily  . insulin aspart  0-6 Units Subcutaneous Q4H  . lamoTRIgine  400 mg Oral BID  . levETIRAcetam  1,125 mg Oral BID  . LORazepam  1 mg Oral BID  . pantoprazole  40 mg Oral Daily  . PHENobarbital  64.8 mg Oral BID   Continuous Infusions: . sodium chloride    . sodium chloride    . cefoTEtan (CEFOTAN) IV     PRN Meds: sodium chloride, acetaminophen, mineral oil-hydrophilic petrolatum, ondansetron (ZOFRAN) IV   Vital Signs    Vitals:   09/25/20 2230 09/26/20 0245 09/26/20 0500 09/26/20 0537  BP: 106/67 124/76  140/89  Pulse: 85 90  88  Resp: 17 17  18   Temp: 98.5 F (36.9 C) 98.6 F (37 C)  98.7 F (37.1 C)  TempSrc: Oral Oral    SpO2: 98% 99%  93%  Weight:   87.4 kg   Height:        Intake/Output Summary (Last 24 hours) at 09/26/2020 0802 Last data filed at 09/26/2020 0245 Gross per 24 hour  Intake 852.5 ml  Output --  Net 852.5 ml   Last 3 Weights 09/26/2020 09/23/2020 09/22/2020  Weight (lbs) 192 lb 10.9 oz 191 lb 8 oz 201 lb 15.1 oz  Weight (kg) 87.4 kg 86.864 kg 91.6 kg      Telemetry    NSR  - Personally Reviewed  ECG    No new EKG to review - Personally Reviewed  Physical Exam   GEN: Well nourished, well developed in no acute distress HEENT: Normal NECK: No JVD; No carotid bruits LYMPHATICS: No lymphadenopathy CARDIAC:RRR, no murmurs, rubs, gallops RESPIRATORY:  Clear to auscultation without rales, wheezing or rhonchi  ABDOMEN: Soft, non-tender, non-distended MUSCULOSKELETAL:  No edema; No deformity  SKIN: Warm and dry NEUROLOGIC:  Alert and oriented x 3 PSYCHIATRIC:  Normal affect    Labs    High Sensitivity Troponin:   Recent Labs  Lab 09/21/20 2051 09/21/20 2245  TROPONINIHS 109* 110*      Chemistry Recent Labs  Lab 09/25/20 0228 09/25/20 1647 09/26/20 0555  NA 135 132* 138  K 3.3* 3.8 3.8  CL 103 100 103  CO2 24 22 26   GLUCOSE 117* 126* 120*  BUN 7 5* <5*  CREATININE 0.70 0.71 0.78  CALCIUM 8.8* 8.9 9.2  PROT 6.2*  --   --   ALBUMIN 2.9*  --   --   AST 13*  --   --   ALT 12  --   --  ALKPHOS 70  --   --   BILITOT 0.6  --   --   GFRNONAA >60 >60 >60  ANIONGAP 8 10 9      Hematology Recent Labs  Lab 09/24/20 0323 09/25/20 0228 09/26/20 0555  WBC 11.6* 9.3 10.0  RBC 4.99 4.33 5.49  HGB 8.6* 7.5* 10.6*  HCT 30.9* 26.9* 36.4*  MCV 61.9* 62.1* 66.3*  MCH 17.2* 17.3* 19.3*  MCHC 27.8* 27.9* 29.1*  RDW 28.5* 29.0* 30.0*  PLT 535* 428* 411*    BNP Recent Labs  Lab 09/21/20 2051  BNP 297.0*     DDimer No results for input(s): DDIMER in the last 168 hours.   Radiology    CT CHEST ABDOMEN PELVIS W CONTRAST  Result Date: 09/24/2020 CLINICAL DATA:  Fungating ascending colonic mass on colonoscopy, suspect colon cancer EXAM: CT CHEST, ABDOMEN, AND PELVIS WITH CONTRAST TECHNIQUE: Multidetector CT imaging of the chest, abdomen and pelvis was performed following the standard protocol during bolus administration of intravenous contrast. CONTRAST:  179mL OMNIPAQUE IOHEXOL 300 MG/ML  SOLN COMPARISON:  09/21/2020 FINDINGS: CT CHEST  FINDINGS Cardiovascular: The heart is unremarkable without pericardial effusion. Ascending thoracic aortic aneurysm measuring 4.4 cm. No evidence of dissection. Minimal atherosclerosis of the distal aortic arch. Mediastinum/Nodes: No enlarged mediastinal, hilar, or axillary lymph nodes. Thyroid gland, trachea, and esophagus demonstrate no significant findings. Lungs/Pleura: There are small bilateral pleural effusions, with minimal dependent lower lobe atelectasis. Patchy ground-glass consolidation within the right upper lobe, suspect hypoventilatory changes or even mild aspiration given recent endoscopy. Inflammatory or infectious etiology could also be considered though less likely. No pneumothorax. The central airways are patent. Musculoskeletal: No acute or destructive bony lesions. CT ABDOMEN PELVIS FINDINGS Hepatobiliary: No focal liver abnormality. No evidence of metastatic disease. No biliary dilation. The gallbladder is unremarkable. Pancreas: Unremarkable. No pancreatic ductal dilatation or surrounding inflammatory changes. Spleen: Normal in size without focal abnormality. Adrenals/Urinary Tract: Right renal cysts are identified within the mid and lower poles. Subcentimeter hypodensities within the upper pole right kidney and throughout the left kidney are too small to characterize, but also likely reflect cysts. There are 2 small nonobstructing calculi within the lower pole left kidney, measuring up to 3 mm in size. No obstructive uropathy within either kidney. The adrenals and bladder are unremarkable. Stomach/Bowel: An eccentric intraluminal and transmural mass is seen along the anti mesenteric aspect of the proximal colon, measuring approximately 5.0 x 1.4 cm in transverse dimension, and extending approximately 3.7 cm in craniocaudal extent. Findings are compatible with the colon mass described on today's colonoscopy, highly suspicious for malignancy. The remainder of the bowel is unremarkable without  obstruction or ileus. Normal appendix right lower quadrant. Vascular/Lymphatic: Mild atherosclerosis throughout the aorta and iliac vessels. There are multiple small lymph nodes in the right lower quadrant mesentery, measuring up to 6 mm in size, nonspecific. No pathologic adenopathy within the abdomen or pelvis. Reproductive: Prostate is unremarkable. Other: Ventriculostomy catheter enters the abdomen in the right lower quadrant, tip within the left hemipelvis. No free fluid or free intraperitoneal gas. No abdominal wall hernia. Musculoskeletal: No acute or destructive bony lesions. IMPRESSION: 1. 5.0 x 1.4 x 3.7 cm eccentric intraluminal and transmural proximal colonic mass highly concerning for colon cancer. No evidence of obstruction. 2. Multiple subcentimeter lymph nodes within the right lower quadrant mesentery, nonspecific. No pathologically enlarged nodes. PET scan may be useful if there is concern for regional nodal metastases, though small size may limit evaluation. 3. Small bilateral pleural  effusions. 4. Minimal ground-glass airspace disease within the right upper lobe, favor hypoventilatory change or aspiration after upper endoscopy. 5. 4.4 cm ascending thoracic aortic aneurysm. Recommend annual imaging followup by CTA or MRA. This recommendation follows 2010 ACCF/AHA/AATS/ACR/ASA/SCA/SCAI/SIR/STS/SVM Guidelines for the Diagnosis and Management of Patients with Thoracic Aortic Disease. Circulation. 2010; 121: M353-I144. Aortic aneurysm NOS (ICD10-I71.9) 6.  Aortic Atherosclerosis (ICD10-I70.0). Electronically Signed   By: Randa Ngo M.D.   On: 09/24/2020 21:11   CT CORONARY MORPH W/CTA COR W/SCORE W/CA W/CM &/OR WO/CM  Addendum Date: 09/25/2020   ADDENDUM REPORT: 09/25/2020 15:25 EXAM: Cardiac/Coronary  CT TECHNIQUE: The patient was scanned on a [employer]. FINDINGS: A 120 kV prospective scan was triggered in the descending thoracic aorta at 111 HU's. Axial non-contrast 3 mm slices  were carried out through the heart. The data set was analyzed on a dedicated work station and scored using the [employer]. Gantry rotation speed was 250 msecs and collimation was .6 mm. No beta blockade and 0.8 mg of sl NTG was given. The 3D data set was reconstructed in 5% intervals of the 67-82 % of the R-R cycle. Diastolic phases were analyzed on a dedicated work station using MPR, MIP and VRT modes. The patient received 80 cc of contrast. Aorta: Mildly dilated ascending aorta at 27mm (measured at the bifurcation of the main pulmonary artery). No calcifications. No dissection. Aortic Valve:  Trileaflet.  Scattered calcifications. Coronary Arteries:  Normal coronary origin.  Right dominance. RCA is a large dominant artery that gives rise to PDA and PLVB. There is mild calcified plaque in the proximal and mid RCA with associated stenosis of 25-49%. There is moderate to severe non calcified plaque in the mid to distal RCA with associated stenosis of at least 50-69% but could be higher than 70%. Study is limited by noise artifact. Left main is a large artery that gives rise to LAD and LCX arteries. There is no plaque. LAD is a large vessel that gives rise to a large trifurcating diagonal and a moderate sized D2. There is a high grade complex long severe mixed plaque in the mid LAD after the takeoff of a large diagonal with associated stenosis of 70-99%. The D1 has calcified plaque in the ostium that is at least 25-49% and possible non calcified plaque in the mid D1 but noise artifact prohibits accurate quantification. LCX is a non-dominant small artery that gives rise to one small OM1 branch. Only the proximal LCx is adequately visualized and there is no plaque. Other findings: Normal pulmonary vein drainage into the left atrium. Normal let atrial appendage without a thrombus. Normal size of the pulmonary artery. IMPRESSION: 1. Coronary calcium score of 192. This was 94th percentile for age and sex matched  control. 2.  Normal coronary origin with right dominance. 3.  Severe atherosclerosis.  CAD RADS 4a. 4. Consider symptom-guided anti-ischemic and preventive pharmacotherapy as well as risk factor modification per guideline-directed care. 5.  Consider cardiac catheterization. 6.  This study has been submitted for FFR flow analysis. Fransico Him Electronically Signed   By: Fransico Him   On: 09/25/2020 15:25   Result Date: 09/25/2020 EXAM: OVER-READ INTERPRETATION  CT CHEST The following report is an over-read performed by radiologist Dr. Vinnie Langton of [facility], [place] on 09/25/2020. This over-read does not include interpretation of cardiac or coronary anatomy or pathology. The coronary calcium score/coronary CTA interpretation by the cardiologist is attached. COMPARISON:  None. FINDINGS: Aortic atherosclerosis with ectasia of the  ascending thoracic aorta (4.0 cm in diameter). Trace bilateral pleural effusions lying dependently with associated areas of subsegmental atelectasis in the dependent portions of the lower lobes of the lungs bilaterally. Within the visualized portions of the thorax there are no suspicious appearing pulmonary nodules or masses, there is no acute consolidative airspace disease, no pneumothorax and no lymphadenopathy. Visualized portions of the upper abdomen are unremarkable. There are no aggressive appearing lytic or blastic lesions noted in the visualized portions of the skeleton. IMPRESSION: 1. Trace bilateral pleural effusions lying dependently with some associated passive subsegmental atelectasis in the lower lobes of the lungs bilaterally. 2.  Aortic Atherosclerosis (ICD10-I70.0). 3. Ectasia of the ascending thoracic aorta (4.0 cm in diameter). Recommend annual imaging followup by CTA or MRA. This recommendation follows 2010 ACCF/AHA/AATS/ACR/ASA/SCA/SCAI/SIR/STS/SVM Guidelines for the Diagnosis and Management of Patients with Thoracic Aortic Disease. Circulation. 2010; 121:  Y185-U314. Aortic aneurysm NOS (ICD10-I71.9). Electronically Signed: By: Vinnie Langton M.D. On: 09/25/2020 11:20    Cardiac Studies   2D echo 09/22/2020 IMPRESSIONS   1. Left ventricular ejection fraction by 3D volume is 56 %. The left  ventricle has normal function. The left ventricle demonstrates regional  wall motion abnormalities (see scoring diagram/findings for description).  Left ventricular diastolic parameters  are consistent with Grade II diastolic dysfunction (pseudonormalization).  2. Right ventricular systolic function is normal. The right ventricular  size is normal. Tricuspid regurgitation signal is inadequate for assessing  PA pressure.  3. The mitral valve is normal in structure. Trivial mitral valve  regurgitation. No evidence of mitral stenosis.  4. The aortic valve is grossly normal. Aortic valve regurgitation is not  visualized. No aortic stenosis is present.  5. Aortic dilatation noted. There is mild dilatation of the aortic root,  measuring 40 mm.  6. The inferior vena cava is normal in size with greater than 50%  respiratory variability, suggesting right atrial pressure of 3 mmHg.   Patient Profile     50 y.o. male with a hx of seizure disorder, diabetes (found this admission), obesity who is being seen  for the evaluation of abnormal echocardiogram at the request of Irene Pap, MD.  Assessment & Plan    Abnormal EKG/Demand Ischemia -admitted with presumed GI bleed and severe anemia (Hbg 6.1) -Hemoccult is positive.  -Troponins were elevated but flat.   -His EKG demonstrates nonspecific ST-T changes. -echo demonstrates normal LV function with hypokinesis of the mid to apical anterior segment and apical septum.   -He has not had any CP -shortness of breath likely related to his anemia.  -he does have CVD risk factors including new onset diabetes which is just been found. -Overall, this does not represent an acute coronary syndrome.  This  represents demand ischemia in the setting of severe anemia.   -Coronary CTA yesterday showed possible high grade obstructive lesion in the LAD and dRCA.  FFR pending -given his severe anemia there is not a lot we can do about this currently.   -would not recommend aspirin or heparin given his life-threatening presumed GI bleed and large colonic mass -he has been started on high dose statin and LDL was 166 this admit -BB held for tachycardia as elevated HR is normal response to anemia -very difficult situation>>if coronary FFR shows reduced flow then he will be at high risk of perioperative MI with colon resection.  Given his GI bleed with Hbg as low at 6.1, he is not a candidate for DAPT with PCI.  He has not  had any chest pin despite significant CAD in setting of severe anemia.  Will discuss with interventional colleagues.   Severe Anemia/Colonic mass -EGD showed esophageal candidiasis -colonoscopy with large fungating ascending colonic mass -surgery consulted -CEA elevated at 12.4 -Hbg 6.1 and up to 10.6 today  HLD -he has aortic atherosclerosis and small ascending aortic aneurysm on abdominal CTA -LDL goal < 70 -LDL was 166 and started on high dose statin -will need FLP and ALT in 6 weeks  I have spent a total of 30 minutes with patient reviewing 2D echo, coronary CTA, telemetry, EKGs, labs and examining patient as well as establishing an assessment and plan that was discussed with the patient.  > 50% of time was spent in direct patient care.      For questions or updates, please contact [place] Please consult [email or web address] for contact info under        Signed, Fransico Him, MD  09/26/2020, 8:02 AM

## 2020-09-27 ENCOUNTER — Inpatient Hospital Stay (HOSPITAL_COMMUNITY): Payer: Medicare HMO | Admitting: Anesthesiology

## 2020-09-27 ENCOUNTER — Encounter (HOSPITAL_COMMUNITY)
Admission: EM | Disposition: A | Discharge status: Home Health | Payer: Self-pay | Source: Home / Self Care | Attending: Internal Medicine

## 2020-09-27 ENCOUNTER — Encounter (HOSPITAL_COMMUNITY): Payer: Self-pay | Admitting: Family Medicine

## 2020-09-27 DIAGNOSIS — D649 Anemia, unspecified: Secondary | ICD-10-CM | POA: Diagnosis not present

## 2020-09-27 DIAGNOSIS — R931 Abnormal findings on diagnostic imaging of heart and coronary circulation: Secondary | ICD-10-CM

## 2020-09-27 DIAGNOSIS — E7800 Pure hypercholesterolemia, unspecified: Secondary | ICD-10-CM | POA: Diagnosis not present

## 2020-09-27 DIAGNOSIS — R778 Other specified abnormalities of plasma proteins: Secondary | ICD-10-CM | POA: Diagnosis not present

## 2020-09-27 HISTORY — PX: LAPAROSCOPIC PARTIAL COLECTOMY: SHX5907

## 2020-09-27 LAB — BASIC METABOLIC PANEL
Anion gap: 11 (ref 5–15)
BUN: <5 mg/dL — ABNORMAL LOW (ref 6–20)
CO2: 25 mmol/L (ref 22–32)
Calcium: 9.5 mg/dL (ref 8.9–10.3)
Chloride: 99 mmol/L (ref 98–111)
Creatinine, Ser: 0.85 mg/dL (ref 0.61–1.24)
GFR, Estimated: >60 mL/min (ref >60)
Glucose, Bld: 143 mg/dL — ABNORMAL HIGH (ref 70–99)
Potassium: 3.7 mmol/L (ref 3.5–5.1)
Sodium: 135 mmol/L (ref 135–145)

## 2020-09-27 LAB — CBC
HCT: 36 % — ABNORMAL LOW (ref 39.0–52.0)
Hemoglobin: 10.9 g/dL — ABNORMAL LOW (ref 13.0–17.0)
MCH: 19.7 pg — ABNORMAL LOW (ref 26.0–34.0)
MCHC: 30.3 g/dL (ref 30.0–36.0)
MCV: 65.2 fL — ABNORMAL LOW (ref 80.0–100.0)
Platelets: 431 10*3/uL — ABNORMAL HIGH (ref 150–400)
RBC: 5.52 MIL/uL (ref 4.22–5.81)
RDW: 30.5 % — ABNORMAL HIGH (ref 11.5–15.5)
WBC: 9.5 10*3/uL (ref 4.0–10.5)
nRBC: 0 % (ref 0.0–0.2)

## 2020-09-27 LAB — GLUCOSE, CAPILLARY
Glucose-Capillary: 111 mg/dL — ABNORMAL HIGH (ref 70–99)
Glucose-Capillary: 111 mg/dL — ABNORMAL HIGH (ref 70–99)
Glucose-Capillary: 114 mg/dL — ABNORMAL HIGH (ref 70–99)
Glucose-Capillary: 116 mg/dL — ABNORMAL HIGH (ref 70–99)
Glucose-Capillary: 134 mg/dL — ABNORMAL HIGH (ref 70–99)
Glucose-Capillary: 141 mg/dL — ABNORMAL HIGH (ref 70–99)

## 2020-09-27 SURGERY — LAPAROSCOPIC PARTIAL COLECTOMY
Anesthesia: General | Site: Abdomen

## 2020-09-27 MED ORDER — SUCCINYLCHOLINE CHLORIDE 200 MG/10ML IV SOSY
PREFILLED_SYRINGE | INTRAVENOUS | Status: DC | PRN
  Administered 2020-09-27: 120 mg via INTRAVENOUS

## 2020-09-27 MED ORDER — PHENYLEPHRINE 40 MCG/ML (10ML) SYRINGE FOR IV PUSH (FOR BLOOD PRESSURE SUPPORT)
PREFILLED_SYRINGE | INTRAVENOUS | Status: DC | PRN
  Administered 2020-09-27: 120 ug via INTRAVENOUS
  Administered 2020-09-27 (×2): 80 ug via INTRAVENOUS

## 2020-09-27 MED ORDER — PROPOFOL 10 MG/ML IV BOLUS
INTRAVENOUS | Status: AC
  Filled 2020-09-27: qty 20

## 2020-09-27 MED ORDER — CHLORHEXIDINE GLUCONATE 0.12 % MT SOLN
OROMUCOSAL | Status: AC
  Administered 2020-09-27: 15 mL via OROMUCOSAL
  Filled 2020-09-27: qty 15

## 2020-09-27 MED ORDER — SODIUM CHLORIDE 0.9 % IV SOLN
INTRAVENOUS | Status: DC | PRN
  Administered 2020-09-27: 2 g via INTRAVENOUS

## 2020-09-27 MED ORDER — PHENYLEPHRINE HCL-NACL 10-0.9 MG/250ML-% IV SOLN
INTRAVENOUS | Status: DC | PRN
  Administered 2020-09-27: 30 ug/min via INTRAVENOUS

## 2020-09-27 MED ORDER — SODIUM CHLORIDE 0.9 % IR SOLN
Status: DC | PRN
  Administered 2020-09-27: 2000 mL
  Administered 2020-09-27: 1000 mL

## 2020-09-27 MED ORDER — DEXAMETHASONE SODIUM PHOSPHATE 10 MG/ML IJ SOLN
INTRAMUSCULAR | Status: AC
  Filled 2020-09-27: qty 2

## 2020-09-27 MED ORDER — PROMETHAZINE HCL 25 MG/ML IJ SOLN: 6.2500 mg | INTRAMUSCULAR | Status: DC | PRN

## 2020-09-27 MED ORDER — FENTANYL CITRATE (PF) 250 MCG/5ML IJ SOLN
INTRAMUSCULAR | Status: DC | PRN
  Administered 2020-09-27: 50 ug via INTRAVENOUS
  Administered 2020-09-27: 100 ug via INTRAVENOUS
  Administered 2020-09-27 (×5): 50 ug via INTRAVENOUS

## 2020-09-27 MED ORDER — SODIUM CHLORIDE 0.9 % IV SOLN
INTRAVENOUS | Status: AC
  Filled 2020-09-27: qty 2

## 2020-09-27 MED ORDER — POTASSIUM CHLORIDE 10 MEQ/100ML IV SOLN
10.0000 meq | INTRAVENOUS | Status: AC
  Administered 2020-09-27: 10 meq via INTRAVENOUS
  Filled 2020-09-27: qty 100

## 2020-09-27 MED ORDER — BUPIVACAINE-EPINEPHRINE (PF) 0.25% -1:200000 IJ SOLN
INTRAMUSCULAR | Status: AC
  Filled 2020-09-27: qty 30

## 2020-09-27 MED ORDER — FENTANYL CITRATE (PF) 250 MCG/5ML IJ SOLN
INTRAMUSCULAR | Status: AC
  Filled 2020-09-27: qty 5

## 2020-09-27 MED ORDER — CHLORHEXIDINE GLUCONATE 0.12 % MT SOLN: 15.0000 mL | Freq: Once | OROMUCOSAL | Status: AC

## 2020-09-27 MED ORDER — MIDAZOLAM HCL 2 MG/2ML IJ SOLN
INTRAMUSCULAR | Status: DC | PRN
  Administered 2020-09-27: 2 mg via INTRAVENOUS

## 2020-09-27 MED ORDER — CHLORHEXIDINE GLUCONATE CLOTH 2 % EX PADS
6.0000 | MEDICATED_PAD | Freq: Every day | CUTANEOUS | Status: DC
  Administered 2020-09-28: 6 via TOPICAL

## 2020-09-27 MED ORDER — PROPOFOL 10 MG/ML IV BOLUS
INTRAVENOUS | Status: DC | PRN
  Administered 2020-09-27: 170 mg via INTRAVENOUS

## 2020-09-27 MED ORDER — ROCURONIUM BROMIDE 10 MG/ML (PF) SYRINGE
PREFILLED_SYRINGE | INTRAVENOUS | Status: DC | PRN
  Administered 2020-09-27: 10 mg via INTRAVENOUS
  Administered 2020-09-27 (×2): 30 mg via INTRAVENOUS
  Administered 2020-09-27: 60 mg via INTRAVENOUS

## 2020-09-27 MED ORDER — LIDOCAINE 2% (20 MG/ML) 5 ML SYRINGE
INTRAMUSCULAR | Status: AC
  Filled 2020-09-27: qty 15

## 2020-09-27 MED ORDER — MIDAZOLAM HCL 2 MG/2ML IJ SOLN
INTRAMUSCULAR | Status: AC
  Filled 2020-09-27: qty 2

## 2020-09-27 MED ORDER — ALBUMIN HUMAN 5 % IV SOLN: INTRAVENOUS | Status: DC | PRN

## 2020-09-27 MED ORDER — FENTANYL CITRATE (PF) 100 MCG/2ML IJ SOLN
50.0000 ug | INTRAMUSCULAR | Status: DC | PRN
  Administered 2020-09-27 – 2020-09-28 (×3): 50 ug via INTRAVENOUS
  Filled 2020-09-27 (×3): qty 2

## 2020-09-27 MED ORDER — LACTATED RINGERS IV SOLN: INTRAVENOUS | Status: DC

## 2020-09-27 MED ORDER — ALVIMOPAN 12 MG PO CAPS: 12.0000 mg | ORAL_CAPSULE | Freq: Two times a day (BID) | ORAL | Status: DC

## 2020-09-27 MED ORDER — MORPHINE SULFATE (PF) 2 MG/ML IV SOLN: 2.0000 mg | INTRAVENOUS | Status: DC | PRN

## 2020-09-27 MED ORDER — EPHEDRINE 5 MG/ML INJ
INTRAVENOUS | Status: AC
  Filled 2020-09-27: qty 10

## 2020-09-27 MED ORDER — SUGAMMADEX SODIUM 200 MG/2ML IV SOLN
INTRAVENOUS | Status: DC | PRN
  Administered 2020-09-27 (×2): 200 mg via INTRAVENOUS

## 2020-09-27 MED ORDER — BUPIVACAINE-EPINEPHRINE 0.25% -1:200000 IJ SOLN
INTRAMUSCULAR | Status: DC | PRN
  Administered 2020-09-27: 18 mL

## 2020-09-27 MED ORDER — OXYCODONE HCL 5 MG PO TABS
5.0000 mg | ORAL_TABLET | ORAL | Status: DC | PRN
  Administered 2020-09-28 – 2020-10-02 (×3): 5 mg via ORAL
  Filled 2020-09-27 (×4): qty 1

## 2020-09-27 MED ORDER — ACETAMINOPHEN 500 MG PO TABS
1000.0000 mg | ORAL_TABLET | Freq: Once | ORAL | Status: AC
  Administered 2020-09-27: 1000 mg via ORAL
  Filled 2020-09-27: qty 2

## 2020-09-27 MED ORDER — ONDANSETRON HCL 4 MG/2ML IJ SOLN
INTRAMUSCULAR | Status: DC | PRN
  Administered 2020-09-27: 4 mg via INTRAVENOUS

## 2020-09-27 MED ORDER — ONDANSETRON HCL 4 MG/2ML IJ SOLN
INTRAMUSCULAR | Status: AC
  Filled 2020-09-27: qty 4

## 2020-09-27 MED ORDER — PHENYLEPHRINE 40 MCG/ML (10ML) SYRINGE FOR IV PUSH (FOR BLOOD PRESSURE SUPPORT)
PREFILLED_SYRINGE | INTRAVENOUS | Status: AC
  Filled 2020-09-27: qty 10

## 2020-09-27 MED ORDER — LIDOCAINE 2% (20 MG/ML) 5 ML SYRINGE
INTRAMUSCULAR | Status: DC | PRN
  Administered 2020-09-27: 100 mg via INTRAVENOUS

## 2020-09-27 MED ORDER — ROCURONIUM BROMIDE 10 MG/ML (PF) SYRINGE
PREFILLED_SYRINGE | INTRAVENOUS | Status: AC
  Filled 2020-09-27: qty 20

## 2020-09-27 MED ORDER — METHOCARBAMOL 1000 MG/10ML IJ SOLN
500.0000 mg | Freq: Four times a day (QID) | INTRAVENOUS | Status: DC | PRN
  Filled 2020-09-27 (×2): qty 5

## 2020-09-27 MED ORDER — FENTANYL CITRATE (PF) 100 MCG/2ML IJ SOLN: 25.0000 ug | INTRAMUSCULAR | Status: DC | PRN

## 2020-09-27 SURGICAL SUPPLY — 71 items
APPLIER CLIP ROT 10 11.4 M/L (STAPLE)
BLADE CLIPPER SURG (BLADE) ×4 IMPLANT
CANISTER SUCT 3000ML PPV (MISCELLANEOUS) IMPLANT
CELLS DAT CNTRL 66122 CELL SVR (MISCELLANEOUS) ×2 IMPLANT
CHLORAPREP W/TINT 26 (MISCELLANEOUS) IMPLANT
CLIP APPLIE ROT 10 11.4 M/L (STAPLE) IMPLANT
COVER MAYO STAND STRL (DRAPES) IMPLANT
COVER SURGICAL LIGHT HANDLE (MISCELLANEOUS) ×8 IMPLANT
COVER WAND RF STERILE (DRAPES) IMPLANT
DRAPE HALF SHEET 40X57 (DRAPES) IMPLANT
DRAPE UTILITY XL STRL (DRAPES) IMPLANT
DRAPE WARM FLUID 44X44 (DRAPES) IMPLANT
DRSG COVADERM PLUS 2X2 (GAUZE/BANDAGES/DRESSINGS) ×8 IMPLANT
DRSG OPSITE POSTOP 4X10 (GAUZE/BANDAGES/DRESSINGS) IMPLANT
DRSG OPSITE POSTOP 4X8 (GAUZE/BANDAGES/DRESSINGS) ×4 IMPLANT
ELECT BLADE 6.5 EXT (BLADE) IMPLANT
ELECT CAUTERY BLADE 6.4 (BLADE) ×4 IMPLANT
ELECT REM PT RETURN 9FT ADLT (ELECTROSURGICAL) ×4
ELECTRODE REM PT RTRN 9FT ADLT (ELECTROSURGICAL) ×2 IMPLANT
GAUZE 4X4 16PLY RFD (DISPOSABLE) ×4 IMPLANT
GEL ULTRASOUND 20GR AQUASONIC (MISCELLANEOUS) IMPLANT
GLOVE BIO SURGEON STRL SZ7.5 (GLOVE) ×8 IMPLANT
GOWN STRL REUS W/ TWL LRG LVL3 (GOWN DISPOSABLE) ×12 IMPLANT
GOWN STRL REUS W/TWL LRG LVL3 (GOWN DISPOSABLE) ×12
KIT BASIN OR (CUSTOM PROCEDURE TRAY) IMPLANT
KIT SIGMOIDOSCOPE (SET/KITS/TRAYS/PACK) IMPLANT
KIT TURNOVER KIT B (KITS) ×4 IMPLANT
LEGGING LITHOTOMY PAIR STRL (DRAPES) IMPLANT
LIGASURE IMPACT 36 18CM CVD LR (INSTRUMENTS) IMPLANT
NS IRRIG 1000ML POUR BTL (IV SOLUTION) ×8 IMPLANT
PACK COLON (CUSTOM PROCEDURE TRAY) ×4 IMPLANT
PAD ARMBOARD 7.5X6 YLW CONV (MISCELLANEOUS) ×4 IMPLANT
PENCIL SMOKE EVACUATOR (MISCELLANEOUS) ×4 IMPLANT
RELOAD PROXIMATE 75MM BLUE (ENDOMECHANICALS) ×8 IMPLANT
RTRCTR WOUND ALEXIS 18CM MED (MISCELLANEOUS) ×4
SCISSORS LAP 5X35 DISP (ENDOMECHANICALS) IMPLANT
SET IRRIG TUBING LAPAROSCOPIC (IRRIGATION / IRRIGATOR) IMPLANT
SET TUBE SMOKE EVAC HIGH FLOW (TUBING) ×4 IMPLANT
SHEARS HARMONIC ACE PLUS 36CM (ENDOMECHANICALS) ×4 IMPLANT
SLEEVE ENDOPATH XCEL 5M (ENDOMECHANICALS) ×4 IMPLANT
SPECIMEN JAR LARGE (MISCELLANEOUS) ×4 IMPLANT
SPONGE LAP 18X18 RF (DISPOSABLE) ×8 IMPLANT
STAPLER GUN LINEAR PROX 60 (STAPLE) ×4 IMPLANT
STAPLER PROXIMATE 75MM BLUE (STAPLE) ×4 IMPLANT
STAPLER VISISTAT 35W (STAPLE) ×4 IMPLANT
SURGILUBE 2OZ TUBE FLIPTOP (MISCELLANEOUS) IMPLANT
SUT PDS AB 1 CT  36 (SUTURE)
SUT PDS AB 1 CT 36 (SUTURE) IMPLANT
SUT PDS AB 1 TP1 96 (SUTURE) ×8 IMPLANT
SUT PROLENE 2 0 CT2 30 (SUTURE) IMPLANT
SUT PROLENE 2 0 KS (SUTURE) IMPLANT
SUT SILK 2 0 (SUTURE) ×2
SUT SILK 2 0 SH CR/8 (SUTURE) ×4 IMPLANT
SUT SILK 2-0 18XBRD TIE 12 (SUTURE) ×2 IMPLANT
SUT SILK 3 0 (SUTURE) ×2
SUT SILK 3 0 SH CR/8 (SUTURE) ×4 IMPLANT
SUT SILK 3-0 18XBRD TIE 12 (SUTURE) ×2 IMPLANT
SYR BULB IRRIG 60ML STRL (SYRINGE) IMPLANT
SYS LAPSCP GELPORT 120MM (MISCELLANEOUS)
SYSTEM LAPSCP GELPORT 120MM (MISCELLANEOUS) IMPLANT
TOWEL GREEN STERILE (TOWEL DISPOSABLE) ×8 IMPLANT
TRAY FOLEY MTR SLVR 16FR STAT (SET/KITS/TRAYS/PACK) ×4 IMPLANT
TRAY LAPAROSCOPIC MC (CUSTOM PROCEDURE TRAY) IMPLANT
TROCAR XCEL 12X100 BLDLESS (ENDOMECHANICALS) IMPLANT
TROCAR XCEL BLUNT TIP 100MML (ENDOMECHANICALS) ×4 IMPLANT
TROCAR XCEL NON-BLD 11X100MML (ENDOMECHANICALS) IMPLANT
TROCAR XCEL NON-BLD 5MMX100MML (ENDOMECHANICALS) ×4 IMPLANT
TUBE CONNECTING 12'X1/4 (SUCTIONS) ×2
TUBE CONNECTING 12X1/4 (SUCTIONS) ×6 IMPLANT
WATER STERILE IRR 1000ML POUR (IV SOLUTION) IMPLANT
YANKAUER SUCT BULB TIP NO VENT (SUCTIONS) IMPLANT

## 2020-09-27 NOTE — Progress Notes (Signed)
PROGRESS NOTE    Jeff Kelley  QAS:[id number] DOB: 10-24-1970 DOA: 09/21/2020 PCP: Patient, No Pcp Per   Brief Narrative:  50 y.o.malewith medical history significant forseizure disorder and anxiety, now presenting to the emergency department for evaluation of shortness of breath. Patient began to notice shortness of breath with exertion approximately 1 month ago, slowly progressed to the point where he was dyspneic at rest.  Upon admission patient was noted to be anemic with hemoglobin of 6.1 requiring transfusion.  Underwent EGD/colonoscopy in 09/24/2020. Discussed with GI Dr. Collene Mares findings concerning for esophageal candidiasis and a large mass in the colon.  CEA ordered and pending.  Will obtain CT chest/abdomen/pelvis with contrast for staging.  Started gentle IV fluid hydration to prevent contrast-induced nephropathy.    Cardiology, GI and general surgery has been consulted.     Assessment & Plan:   Principal Problem:   Symptomatic anemia Active Problems:   Seizures (HCC)   Occult GI bleeding   Elevated troponin   CHF (congestive heart failure) (HCC)   Hyperglycemia   Iron deficiency anemia   Hyponatremia   Abnormal echocardiogram   Symptomatic iron deficiency anemia secondary to large malignant colonic mass Newly diagnosed colon mass seen on colonoscopy done on 09/24/2020 by Dr. 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 -General surgery-surgical resection later today -Dr. Learta Codding has arranged for outpatient follow-up appointment in 3 weeks  Esophageal Candida Started p.o. Diflucan 100 mg daily x7 days.  Last day 3/20  Newly diagnosed  ascending aortic aneurysm measuring 4.4 cm Patient made aware of this finding and of recommendations.  ACCF/AHA guidelines. Recommended annual imaging follow-up by CTA or MRA.  Severe symptomatic iron deficiency anemia with positive FOBT likely secondary to chronic blood loss in the setting of large malignant colon mass. Admission hemoglobin 6.1 status post PRBC transfusion.  Hemoglobin is stable for now.  Elevated troponin likely secondary to demand ischemia in the setting of severe anemia Diastolic CHF/mid and distal anterior septum, apical anterior segments are hypokinetic. Overall echocardiogram is unremarkable.  CT coronary showed elevated calcium scores.  High FFR.  Plans for cardiac catheterization likely after his colon surgery as you may need to be on dual antiplatelet Started on Lipitor 2D echo showing LVEF 56% left ventricle demonstrate regional wall motion abnormalities, left ventricle diastolic parameters are consistent with grade 2 diastolic dysfunction. Cardiology following.   Type 2 diabetes with hyperglycemia Hemoglobin A1c 9.6 on 09/30/2020 Continue insulin sliding scale. Continue to monitor CBGs  Seizure disorder Continue home regimen. Seizures precautions.  Chronic anxiety Continue home regimen.    DVT prophylaxis: SCD's Start: 09/25/20 1413 SCDs Start: 09/22/20 0020  Code Status: Full code Family Communication:    Status is: Inpatient  Remains inpatient appropriate because:Inpatient level of care appropriate due to severity of illness   Dispo:  Patient From: Home  Planned Disposition: Home  Medically stable for discharge: No      Body mass index is 31.1 kg/m.       Subjective: Sitting on the chair no complaints awaiting for surgery  Review of Systems Otherwise negative except as per HPI, including: General: Denies fever, chills, night sweats or unintended weight loss. Resp: Denies cough, wheezing, shortness of breath. Cardiac:  Denies  chest pain, palpitations, orthopnea, paroxysmal nocturnal dyspnea. GI: Denies abdominal pain, nausea, vomiting, diarrhea or constipation GU: Denies dysuria, frequency, hesitancy or incontinence MS: Denies muscle aches, joint pain or swelling Neuro: Denies headache, neurologic deficits (focal weakness, numbness, tingling), abnormal gait Psych: Denies anxiety, depression, SI/HI/AVH Skin: Denies new rashes or lesions ID: Denies sick contacts, exotic exposures, travel  Examination:  Constitutional: Not in acute distress Respiratory: Clear to auscultation bilaterally Cardiovascular: Normal sinus rhythm, no rubs Abdomen: Nontender nondistended good bowel sounds Musculoskeletal: No edema noted Skin: No rashes seen Neurologic: CN 2-12 grossly intact.  And nonfocal Psychiatric: Normal judgment and insight. Alert and oriented x 3. Normal mood.  Objective: Vitals:   09/26/20 1031 09/26/20 1320 09/26/20 2100 09/27/20 0500  BP: (!) 140/97 138/88 (!) 139/91 115/79  Pulse:  82 87 87  Resp: 20 18 17 17   Temp: 97.8 F (36.6 C) 97.8 F (36.6 C) 98.6 F (37 C) 97.6 F (36.4 C)  TempSrc: Oral Oral Oral Oral  SpO2: 97% 100% 95% 93%  Weight:      Height:        Intake/Output Summary (Last 24 hours) at 09/27/2020 1157 Last data filed at 09/27/2020 1103 Gross per 24 hour  Intake 102.83 ml  Output -  Net 102.83 ml   Filed Weights   09/22/20 0321 09/23/20 0830 09/26/20 0500  Weight: 91.6 kg 86.9 kg 87.4 kg     Data Reviewed:   CBC: Recent Labs  Lab 09/23/20 1602 09/24/20 0323 09/25/20 0228 09/26/20 0555 09/27/20 0420  WBC 12.4* 11.6* 9.3 10.0 9.5  HGB 8.3* 8.6* 7.5* 10.6* 10.9*  HCT 30.0* 30.9* 26.9* 36.4* 36.0*  MCV 62.0* 61.9* 62.1* 66.3* 65.2*  PLT 477* 535* 428* 411* 062*   Basic Metabolic Panel: Recent Labs  Lab 09/24/20 0323 09/25/20 0228 09/25/20 1647 09/26/20 0555 09/27/20 0420  NA 137 135 132* 138 135  K 3.8 3.3* 3.8 3.8 3.7  CL 102 103 100 103 99  CO2  25 24 22 26 25   GLUCOSE 155* 117* 126* 120* 143*  BUN 9 7 5* <5* <5*  CREATININE 0.87 0.70 0.71 0.78 0.85  CALCIUM 9.5 8.8* 8.9 9.2 9.5  MG  --  2.0 1.9 1.9  --   PHOS  --   --  3.4  --   --    GFR: Estimated Creatinine Clearance: 107.6 mL/min (by C-G formula based on SCr of 0.85 mg/dL). Liver Function Tests: Recent Labs  Lab 09/25/20 0228  AST 13*  ALT 12  ALKPHOS 70  BILITOT 0.6  PROT 6.2*  ALBUMIN 2.9*   No results for input(s): LIPASE, AMYLASE in the last 168 hours. No results for input(s): AMMONIA in the last 168 hours. Coagulation Profile: Recent Labs  Lab 09/26/20 0555  INR 1.1   Cardiac Enzymes: No results for input(s): CKTOTAL, CKMB, CKMBINDEX, TROPONINI in the last 168 hours. BNP (last 3 results) No results for input(s): PROBNP in the last 8760 hours. HbA1C: No results for input(s): HGBA1C in the last 72 hours. CBG: Recent Labs  Lab 09/26/20 2058 09/27/20 0031 09/27/20 0518 09/27/20 0752 09/27/20 1120  GLUCAP 98 111* 134* 116* 111*   Lipid Profile: No results for input(s): CHOL, HDL, LDLCALC, TRIG, CHOLHDL, LDLDIRECT in the last 72 hours. Thyroid Function Tests: No results for input(s): TSH, T4TOTAL, FREET4, T3FREE, THYROIDAB in the last 72 hours. Anemia Panel: No results for input(s): VITAMINB12, FOLATE, FERRITIN, TIBC, IRON, RETICCTPCT in the last 72 hours. Sepsis Labs: No results for  input(s): PROCALCITON, LATICACIDVEN in the last 168 hours.  Recent Results (from the past 240 hour(s))  SARS CORONAVIRUS 2 (TAT 6-24 HRS) Nasopharyngeal Nasopharyngeal Swab     Status: None   Collection Time: 09/21/20  8:45 PM   Specimen: Nasopharyngeal Swab  Result Value Ref Range Status   SARS Coronavirus 2 NEGATIVE NEGATIVE Final    Comment: (NOTE) SARS-CoV-2 target nucleic acids are NOT DETECTED.  The SARS-CoV-2 RNA is generally detectable in upper and lower respiratory specimens during the acute phase of infection. Negative results do not preclude  SARS-CoV-2 infection, do not rule out co-infections with other pathogens, and should not be used as the sole basis for treatment or other patient management decisions. Negative results must be combined with clinical observations, patient history, and epidemiological information. The expected result is Negative.  Fact Sheet for Patients: SugarRoll.be  Fact Sheet for Healthcare Providers: https://www.woods-mathews.com/  This test is not yet approved or cleared by the [place] FDA and  has been authorized for detection and/or diagnosis of SARS-CoV-2 by FDA under an Emergency Use Authorization (EUA). This EUA will remain  in effect (meaning this test can be used) for the duration of the COVID-19 declaration under Se ction 564(b)(1) of the Act, 21 U.S.C. section 360bbb-3(b)(1), unless the authorization is terminated or revoked sooner.  Performed at [facility], [place] [address], [place], [place] [address]   MRSA PCR Screening     Status: None   Collection Time: 09/22/20  4:25 AM   Specimen: Nasopharyngeal  Result Value Ref Range Status   MRSA by PCR NEGATIVE NEGATIVE Final    Comment:        The GeneXpert MRSA Assay (FDA approved for NASAL specimens only), is one component of a comprehensive MRSA colonization surveillance program. It is not intended to diagnose MRSA infection nor to guide or monitor treatment for MRSA infections. Performed at [facility], [place] [address] [place], [place] [address]          Radiology Studies: No results found.      Scheduled Meds: . acetaminophen  1,000 mg Oral Once  . atorvastatin  80 mg Oral Daily  . Cenobamate  100 mg Oral Daily  . feeding supplement  1 Container Oral TID BM  . ferrous sulfate  325 mg Oral Q breakfast  . fluconazole  100 mg Oral Daily  . insulin aspart  0-6 Units Subcutaneous Q4H  . lamoTRIgine  400 mg Oral BID  . levETIRAcetam  1,125 mg Oral BID  .  LORazepam  1 mg Oral BID  . pantoprazole  40 mg Oral Daily  . PHENobarbital  64.8 mg Oral BID   Continuous Infusions: . sodium chloride    . sodium chloride    . potassium chloride Stopped (09/27/20 1030)     LOS: 6 days   Time spent= 35 mins    Ankit Arsenio Loader, MD [facility]  If 7PM-7AM, please contact night-coverage  09/27/2020, 11:57 AM

## 2020-09-27 NOTE — Transfer of Care (Signed)
Immediate Anesthesia Transfer of Care Note  Patient: Jeff Kelley  Procedure(s) Performed: LAPAROSCOPIC ASSISTED  PARTIAL COLECTOMY (N/A Abdomen)  Patient Location: PACU  Anesthesia Type:General  Level of Consciousness: drowsy, patient cooperative and responds to stimulation  Airway & Oxygen Therapy: Patient Spontanous Breathing and Patient connected to face mask oxygen  Post-op Assessment: Report given to RN and Post -op Vital signs reviewed and stable  Post vital signs: Reviewed and stable  Last Vitals:  Vitals Value Taken Time  BP 148/93 09/27/20 1625  Temp    Pulse 89 09/27/20 1626  Resp 16 09/27/20 1626  SpO2 95 % 09/27/20 1626  Vitals shown include unvalidated device data.  Last Pain:  Vitals:   09/27/20 0833  TempSrc:   PainSc: 0-No pain      Patients Stated Pain Goal: 5 (90/21/11 5520)  Complications: No complications documented.

## 2020-09-27 NOTE — Anesthesia Preprocedure Evaluation (Addendum)
Anesthesia Evaluation  Patient identified by MRN, date of birth, ID band Patient awake    Reviewed: Allergy & Precautions, NPO status , Patient's Chart, lab work & pertinent test results  Airway Mallampati: II  TM Distance: >3 FB Neck ROM: Full    Dental  (+) Dental Advisory Given, Missing   Pulmonary former smoker,    Pulmonary exam normal breath sounds clear to auscultation       Cardiovascular + CAD (Coronary CTA showed possible high grade lesion in LAD and distal RCA and coronary FFR now shows significant flow limiting lesions in the mid RCA and mid LAD), + Past MI and +CHF  Normal cardiovascular exam Rhythm:Regular Rate:Normal  Echo 09/22/20: 1. Left ventricular ejection fraction by 3D volume is 56 %. The left  ventricle has normal function. The left ventricle demonstrates regional  wall motion abnormalities (see scoring diagram/findings for description).  Left ventricular diastolic parameters  are consistent with Grade II diastolic dysfunction (pseudonormalization).  2. Right ventricular systolic function is normal. The right ventricular  size is normal. Tricuspid regurgitation signal is inadequate for assessing  PA pressure.  3. The mitral valve is normal in structure. Trivial mitral valve  regurgitation. No evidence of mitral stenosis.  4. The aortic valve is grossly normal. Aortic valve regurgitation is not  visualized. No aortic stenosis is present.  5. Aortic dilatation noted. There is mild dilatation of the aortic root,  measuring 40 mm.  6. The inferior vena cava is normal in size with greater than 50%  respiratory variability, suggesting right atrial pressure of 3 mmHg.    Neuro/Psych Seizures -,     GI/Hepatic Neg liver ROS, GERD  Medicated,COLON MASS   Endo/Other  Obesity   Renal/GU negative Renal ROS     Musculoskeletal negative musculoskeletal ROS (+)   Abdominal   Peds  Hematology  (+)  Blood dyscrasia, anemia ,   Anesthesia Other Findings Day of surgery medications reviewed with the patient.  Reproductive/Obstetrics                           Anesthesia Physical Anesthesia Plan  ASA: IV  Anesthesia Plan: General   Post-op Pain Management:    Induction: Intravenous  PONV Risk Score and Plan: 3 and Midazolam, Dexamethasone and Ondansetron  Airway Management Planned: Oral ETT  Additional Equipment: Arterial line  Intra-op Plan:   Post-operative Plan: Possible Post-op intubation/ventilation  Informed Consent: I have reviewed the patients History and Physical, chart, labs and discussed the procedure including the risks, benefits and alternatives for the proposed anesthesia with the patient or authorized representative who has indicated his/her understanding and acceptance.     Dental advisory given  Plan Discussed with: CRNA  Anesthesia Plan Comments: (2nd large bore PIV)       Anesthesia Quick Evaluation

## 2020-09-27 NOTE — Anesthesia Postprocedure Evaluation (Signed)
Anesthesia Post Note  Patient: Jeff Kelley  Procedure(s) Performed: LAPAROSCOPIC ASSISTED  PARTIAL COLECTOMY (N/A Abdomen)     Patient location during evaluation: PACU Anesthesia Type: General Level of consciousness: awake and alert Pain management: pain level controlled Vital Signs Assessment: post-procedure vital signs reviewed and stable Respiratory status: spontaneous breathing, nonlabored ventilation, respiratory function stable and patient connected to nasal cannula oxygen Cardiovascular status: blood pressure returned to baseline and stable Postop Assessment: no apparent nausea or vomiting Anesthetic complications: no   No complications documented.  Last Vitals:  Vitals:   09/27/20 1640 09/27/20 1655  BP: (!) 143/92 (!) 142/91  Pulse: 90 89  Resp: 15 16  Temp:    SpO2: 93% 92%    Last Pain:  Vitals:   09/27/20 1627  TempSrc:   PainSc: Asleep                 Catalina Gravel

## 2020-09-27 NOTE — Progress Notes (Signed)
[facility] Progress Note  3 Days Post-Op  Subjective: CC-  Up in chair. Anxious to know plan for today. Denies abdominal pain, n/v. Tolerated clear liquids yesterday. Continues to pass some watery stool but this is less. Hgb 10.9 from 10.6.  Objective: Vital signs in last 24 hours: Temp:  [97.6 F (36.4 C)-98.6 F (37 C)] 97.6 F (36.4 C) (03/17 0500) Pulse Rate:  [82-87] 87 (03/17 0500) Resp:  [17-20] 17 (03/17 0500) BP: (115-140)/(79-97) 115/79 (03/17 0500) SpO2:  [93 %-100 %] 93 % (03/17 0500) Last BM Date: 09/26/20  Intake/Output from previous day: No intake/output data recorded. Intake/Output this shift: No intake/output data recorded.  PE: Gen:  Alert, NAD, pleasant HEENT: EOM's intact, pupils equal and round Card:  RRR Pulm:  CTAB, no W/R/R, rate and effort normal Abd: Soft, NT/ND, +BS, no HSM Psych: A&Ox4  Skin: no rashes noted, warm and dry  Lab Results:  Recent Labs    09/26/20 0555 09/27/20 0420  WBC 10.0 9.5  HGB 10.6* 10.9*  HCT 36.4* 36.0*  PLT 411* 431*   BMET Recent Labs    09/26/20 0555 09/27/20 0420  NA 138 135  K 3.8 3.7  CL 103 99  CO2 26 25  GLUCOSE 120* 143*  BUN <5* <5*  CREATININE 0.78 0.85  CALCIUM 9.2 9.5   PT/INR Recent Labs    09/26/20 0555  LABPROT 14.1  INR 1.1   CMP     Component Value Date/Time   NA 135 09/27/2020 0420   K 3.7 09/27/2020 0420   CL 99 09/27/2020 0420   CO2 25 09/27/2020 0420   GLUCOSE 143 (H) 09/27/2020 0420   BUN <5 (L) 09/27/2020 0420   CREATININE 0.85 09/27/2020 0420   CALCIUM 9.5 09/27/2020 0420   PROT 6.2 (L) 09/25/2020 0228   ALBUMIN 2.9 (L) 09/25/2020 0228   AST 13 (L) 09/25/2020 0228   ALT 12 09/25/2020 0228   ALKPHOS 70 09/25/2020 0228   BILITOT 0.6 09/25/2020 0228   GFRNONAA >60 09/27/2020 0420   Lipase  No results found for: LIPASE     Studies/Results: CT CORONARY MORPH W/CTA COR W/SCORE W/CA W/CM &/OR WO/CM  Addendum Date: 09/25/2020   ADDENDUM  REPORT: 09/25/2020 15:25 EXAM: Cardiac/Coronary  CT TECHNIQUE: The patient was scanned on a [employer]. FINDINGS: A 120 kV prospective scan was triggered in the descending thoracic aorta at 111 HU's. Axial non-contrast 3 mm slices were carried out through the heart. The data set was analyzed on a dedicated work station and scored using the [place]. Gantry rotation speed was 250 msecs and collimation was .6 mm. No beta blockade and 0.8 mg of sl NTG was given. The 3D data set was reconstructed in 5% intervals of the 67-82 % of the R-R cycle. Diastolic phases were analyzed on a dedicated work station using MPR, MIP and VRT modes. The patient received 80 cc of contrast. Aorta: Mildly dilated ascending aorta at 11mm (measured at the bifurcation of the main pulmonary artery). No calcifications. No dissection. Aortic Valve:  Trileaflet.  Scattered calcifications. Coronary Arteries:  Normal coronary origin.  Right dominance. RCA is a large dominant artery that gives rise to PDA and PLVB. There is mild calcified plaque in the proximal and mid RCA with associated stenosis of 25-49%. There is moderate to severe non calcified plaque in the mid to distal RCA with associated stenosis of at least 50-69% but could be higher than 70%. Study is limited by noise  artifact. Left main is a large artery that gives rise to LAD and LCX arteries. There is no plaque. LAD is a large vessel that gives rise to a large trifurcating diagonal and a moderate sized D2. There is a high grade complex long severe mixed plaque in the mid LAD after the takeoff of a large diagonal with associated stenosis of 70-99%. The D1 has calcified plaque in the ostium that is at least 25-49% and possible non calcified plaque in the mid D1 but noise artifact prohibits accurate quantification. LCX is a non-dominant small artery that gives rise to one small OM1 branch. Only the proximal LCx is adequately visualized and there is no plaque. Other  findings: Normal pulmonary vein drainage into the left atrium. Normal let atrial appendage without a thrombus. Normal size of the pulmonary artery. IMPRESSION: 1. Coronary calcium score of 192. This was 94th percentile for age and sex matched control. 2.  Normal coronary origin with right dominance. 3.  Severe atherosclerosis.  CAD RADS 4a. 4. Consider symptom-guided anti-ischemic and preventive pharmacotherapy as well as risk factor modification per guideline-directed care. 5.  Consider cardiac catheterization. 6.  This study has been submitted for FFR flow analysis. Fransico Him Electronically Signed   By: Fransico Him   On: 09/25/2020 15:25   Result Date: 09/25/2020 EXAM: OVER-READ INTERPRETATION  CT CHEST The following report is an over-read performed by radiologist Dr. Vinnie Langton of [facility], [place] on 09/25/2020. This over-read does not include interpretation of cardiac or coronary anatomy or pathology. The coronary calcium score/coronary CTA interpretation by the cardiologist is attached. COMPARISON:  None. FINDINGS: Aortic atherosclerosis with ectasia of the ascending thoracic aorta (4.0 cm in diameter). Trace bilateral pleural effusions lying dependently with associated areas of subsegmental atelectasis in the dependent portions of the lower lobes of the lungs bilaterally. Within the visualized portions of the thorax there are no suspicious appearing pulmonary nodules or masses, there is no acute consolidative airspace disease, no pneumothorax and no lymphadenopathy. Visualized portions of the upper abdomen are unremarkable. There are no aggressive appearing lytic or blastic lesions noted in the visualized portions of the skeleton. IMPRESSION: 1. Trace bilateral pleural effusions lying dependently with some associated passive subsegmental atelectasis in the lower lobes of the lungs bilaterally. 2.  Aortic Atherosclerosis (ICD10-I70.0). 3. Ectasia of the ascending thoracic aorta (4.0 cm in  diameter). Recommend annual imaging followup by CTA or MRA. This recommendation follows 2010 ACCF/AHA/AATS/ACR/ASA/SCA/SCAI/SIR/STS/SVM Guidelines for the Diagnosis and Management of Patients with Thoracic Aortic Disease. Circulation. 2010; 121: R740-C144. Aortic aneurysm NOS (ICD10-I71.9). Electronically Signed: By: Vinnie Langton M.D. On: 09/25/2020 11:20    Anti-infectives: Anti-infectives (From admission, onward)   Start     Dose/Rate Route Frequency Ordered Stop   09/26/20 0600  cefoTEtan (CEFOTAN) 2 g in sodium chloride 0.9 % 100 mL IVPB        2 g 200 mL/hr over 30 Minutes Intravenous On call to O.R. 09/25/20 1412 09/27/20 0559   09/24/20 1600  fluconazole (DIFLUCAN) tablet 100 mg        100 mg Oral Daily 09/24/20 1552 10/01/20 0959       Assessment/Plan Hx of Seizures 4.4 cm ascending thoracic aortic aneurysm VP shunt in place HLD DM - Hbg A1c 9.6 Elevated Tnon admission - Coronary CTA showed possible high grade lesion in LAD and distal RCA and coronary FFR now shows significant flow limiting lesions in the mid RCA and mid LAD. Per cardiology he is not a candidate  for DAPT with PCI until colon resection complete. Would recommend proceeding with surgery.  Need to avoid hypotension.  Once cleared from a surgical standpoint for DAPT therapy, will plan cath and PCI. This can be done in outpt setting if he is stable postop CAD Severe atherosclerosis  Esophageal plaques- noted on endoscopy, brushing taken by Dr. Collene Mares ABL anemia- likely 2/2 GI loss. hgb 6.1 on admission, 10.9 today from 10.6 Moderate malnutrition - Prealbumin 16.2  Colon Mass - Endoscopy showed a large malignant tumor in the proximal ascending colon close to the ICVthat wasbiopsied.This also showed a6 mm sessile polyp in the sigmoid colonand a10 mm sessile polyp at the hepatic flexure that were retrieved with snare. - Pathology: polyps benign, right colon mass adenocarcinoma - CT CAPordered and  showed5.0 x 1.4 x 3.7 cm eccentric intraluminal and transmural proximal colonic mass highly concerning for colon cancer, no evidence of obstruction; multiplesubcentimeter lymph nodes within the right lower quadrant mesentery - CEA12.4  FEN -NPO for possible procedure VTE -SCDs ID -[email or web address] 3/14>>  Plan: Plan for OR today. Appreciate cardiology assistance. He is high risk but unfortunately nothing to do at this time, needs colon resection prior to PCI as he is not a candidate for DAPT because of ABLA from colon mass. Keep NPO.    LOS: 6 days    Wellington Hampshire, [facility] Surgery 09/27/2020, 8:35 AM Please see Amion for pager number during day hours 7:00am-4:30pm

## 2020-09-27 NOTE — Progress Notes (Addendum)
Progress Note  Patient Name: Jeff Kelley Date of Encounter: 09/27/2020  [facility] Cardiologist: Evalina Field, MD   Subjective   Admitted with SOB and found to be severely anemic with Hbg 6.1. Initial hsTrop 109>>110.  EKG abnormal on admit with ? AS MI and nonspecific ST/T wave abnormality and 2D echo with normal LVF and HK of mid to distal AS and apical anterior wall.   EGD with possible candidiasis.  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 Surgery consulted but have not seen yet.  Coronary CTA showed possible high grade lesion in LAD and distal RCA and coronary FFR now shows significant flow limiting lesions in the mid RCA and mid LAD.  Plan is for colon resection today.   Inpatient Medications    Scheduled Meds: . acetaminophen  1,000 mg Oral Once  . atorvastatin  80 mg Oral Daily  . Cenobamate  100 mg Oral Daily  . feeding supplement  1 Container Oral TID BM  . ferrous sulfate  325 mg Oral Q breakfast  . fluconazole  100 mg Oral Daily  . insulin aspart  0-6 Units Subcutaneous Q4H  . lamoTRIgine  400 mg Oral BID  . levETIRAcetam  1,125 mg Oral BID  . LORazepam  1 mg Oral BID  . pantoprazole  40 mg Oral Daily  . PHENobarbital  64.8 mg Oral BID   Continuous Infusions: . sodium chloride    . sodium chloride     PRN Meds: sodium chloride, acetaminophen, mineral oil-hydrophilic petrolatum, ondansetron (ZOFRAN) IV   Vital Signs    Vitals:   09/26/20 1031 09/26/20 1320 09/26/20 2100 09/27/20 0500  BP: (!) 140/97 138/88 (!) 139/91 115/79  Pulse:  82 87 87  Resp: 20 18 17 17   Temp: 97.8 F (36.6 C) 97.8 F (36.6 C) 98.6 F (37 C) 97.6 F (36.4 C)  TempSrc: Oral Oral Oral Oral  SpO2: 97% 100% 95% 93%  Weight:      Height:       No intake or output data in the 24 hours ending 09/27/20 0830 Last 3 Weights 09/26/2020 09/23/2020 09/22/2020  Weight (lbs) 192 lb 10.9 oz 191 lb 8 oz 201 lb 15.1 oz  Weight (kg) 87.4 kg 86.864 kg  91.6 kg      Telemetry    NSR - Personally Reviewed  ECG    No new EKG to review - Personally Reviewed  Physical Exam   GEN: Well nourished, well developed in no acute distress HEENT: Normal NECK: No JVD; No carotid bruits LYMPHATICS: No lymphadenopathy CARDIAC:RRR, no murmurs, rubs, gallops RESPIRATORY:  Clear to auscultation without rales, wheezing or rhonchi  ABDOMEN: Soft, non-tender, non-distended MUSCULOSKELETAL:  No edema; No deformity  SKIN: Warm and dry NEUROLOGIC:  Alert and oriented x 3 PSYCHIATRIC:  Normal affect    Labs    High Sensitivity Troponin:   Recent Labs  Lab 09/21/20 2051 09/21/20 2245  TROPONINIHS 109* 110*      Chemistry Recent Labs  Lab 09/25/20 0228 09/25/20 1647 09/26/20 0555 09/27/20 0420  NA 135 132* 138 135  K 3.3* 3.8 3.8 3.7  CL 103 100 103 99  CO2 24 22 26 25   GLUCOSE 117* 126* 120* 143*  BUN 7 5* <5* <5*  CREATININE 0.70 0.71 0.78 0.85  CALCIUM 8.8* 8.9 9.2 9.5  PROT 6.2*  --   --   --   ALBUMIN 2.9*  --   --   --  AST 13*  --   --   --   ALT 12  --   --   --   ALKPHOS 70  --   --   --   BILITOT 0.6  --   --   --   GFRNONAA >60 >60 >60 >60  ANIONGAP 8 10 9 11      Hematology Recent Labs  Lab 09/25/20 0228 09/26/20 0555 09/27/20 0420  WBC 9.3 10.0 9.5  RBC 4.33 5.49 5.52  HGB 7.5* 10.6* 10.9*  HCT 26.9* 36.4* 36.0*  MCV 62.1* 66.3* 65.2*  MCH 17.3* 19.3* 19.7*  MCHC 27.9* 29.1* 30.3  RDW 29.0* 30.0* 30.5*  PLT 428* 411* 431*    BNP Recent Labs  Lab 09/21/20 2051  BNP 297.0*     DDimer No results for input(s): DDIMER in the last 168 hours.   Radiology    CT CORONARY MORPH W/CTA COR W/SCORE W/CA W/CM &/OR WO/CM  Addendum Date: 09/25/2020   ADDENDUM REPORT: 09/25/2020 15:25 EXAM: Cardiac/Coronary  CT TECHNIQUE: The patient was scanned on a [employer]. FINDINGS: A 120 kV prospective scan was triggered in the descending thoracic aorta at 111 HU's. Axial non-contrast 3 mm slices were  carried out through the heart. The data set was analyzed on a dedicated work station and scored using the [place]. Gantry rotation speed was 250 msecs and collimation was .6 mm. No beta blockade and 0.8 mg of sl NTG was given. The 3D data set was reconstructed in 5% intervals of the 67-82 % of the R-R cycle. Diastolic phases were analyzed on a dedicated work station using MPR, MIP and VRT modes. The patient received 80 cc of contrast. Aorta: Mildly dilated ascending aorta at 83mm (measured at the bifurcation of the main pulmonary artery). No calcifications. No dissection. Aortic Valve:  Trileaflet.  Scattered calcifications. Coronary Arteries:  Normal coronary origin.  Right dominance. RCA is a large dominant artery that gives rise to PDA and PLVB. There is mild calcified plaque in the proximal and mid RCA with associated stenosis of 25-49%. There is moderate to severe non calcified plaque in the mid to distal RCA with associated stenosis of at least 50-69% but could be higher than 70%. Study is limited by noise artifact. Left main is a large artery that gives rise to LAD and LCX arteries. There is no plaque. LAD is a large vessel that gives rise to a large trifurcating diagonal and a moderate sized D2. There is a high grade complex long severe mixed plaque in the mid LAD after the takeoff of a large diagonal with associated stenosis of 70-99%. The D1 has calcified plaque in the ostium that is at least 25-49% and possible non calcified plaque in the mid D1 but noise artifact prohibits accurate quantification. LCX is a non-dominant small artery that gives rise to one small OM1 branch. Only the proximal LCx is adequately visualized and there is no plaque. Other findings: Normal pulmonary vein drainage into the left atrium. Normal let atrial appendage without a thrombus. Normal size of the pulmonary artery. IMPRESSION: 1. Coronary calcium score of 192. This was 94th percentile for age and sex matched control. 2.   Normal coronary origin with right dominance. 3.  Severe atherosclerosis.  CAD RADS 4a. 4. Consider symptom-guided anti-ischemic and preventive pharmacotherapy as well as risk factor modification per guideline-directed care. 5.  Consider cardiac catheterization. 6.  This study has been submitted for FFR flow analysis. Fransico Him Electronically Signed  By: Fransico Him   On: 09/25/2020 15:25   Result Date: 09/25/2020 EXAM: OVER-READ INTERPRETATION  CT CHEST The following report is an over-read performed by radiologist Dr. Vinnie Langton of [facility], [place] on 09/25/2020. This over-read does not include interpretation of cardiac or coronary anatomy or pathology. The coronary calcium score/coronary CTA interpretation by the cardiologist is attached. COMPARISON:  None. FINDINGS: Aortic atherosclerosis with ectasia of the ascending thoracic aorta (4.0 cm in diameter). Trace bilateral pleural effusions lying dependently with associated areas of subsegmental atelectasis in the dependent portions of the lower lobes of the lungs bilaterally. Within the visualized portions of the thorax there are no suspicious appearing pulmonary nodules or masses, there is no acute consolidative airspace disease, no pneumothorax and no lymphadenopathy. Visualized portions of the upper abdomen are unremarkable. There are no aggressive appearing lytic or blastic lesions noted in the visualized portions of the skeleton. IMPRESSION: 1. Trace bilateral pleural effusions lying dependently with some associated passive subsegmental atelectasis in the lower lobes of the lungs bilaterally. 2.  Aortic Atherosclerosis (ICD10-I70.0). 3. Ectasia of the ascending thoracic aorta (4.0 cm in diameter). Recommend annual imaging followup by CTA or MRA. This recommendation follows 2010 ACCF/AHA/AATS/ACR/ASA/SCA/SCAI/SIR/STS/SVM Guidelines for the Diagnosis and Management of Patients with Thoracic Aortic Disease. Circulation. 2010; 121: U981-X914.  Aortic aneurysm NOS (ICD10-I71.9). Electronically Signed: By: Vinnie Langton M.D. On: 09/25/2020 11:20    Cardiac Studies   2D echo 09/22/2020 IMPRESSIONS   1. Left ventricular ejection fraction by 3D volume is 56 %. The left  ventricle has normal function. The left ventricle demonstrates regional  wall motion abnormalities (see scoring diagram/findings for description).  Left ventricular diastolic parameters  are consistent with Grade II diastolic dysfunction (pseudonormalization).  2. Right ventricular systolic function is normal. The right ventricular  size is normal. Tricuspid regurgitation signal is inadequate for assessing  PA pressure.  3. The mitral valve is normal in structure. Trivial mitral valve  regurgitation. No evidence of mitral stenosis.  4. The aortic valve is grossly normal. Aortic valve regurgitation is not  visualized. No aortic stenosis is present.  5. Aortic dilatation noted. There is mild dilatation of the aortic root,  measuring 40 mm.  6. The inferior vena cava is normal in size with greater than 50%  respiratory variability, suggesting right atrial pressure of 3 mmHg.   Patient Profile     50 y.o. male with a hx of seizure disorder, diabetes (found this admission), obesity who is being seen  for the evaluation of abnormal echocardiogram at the request of Irene Pap, MD.  Assessment & Plan    Abnormal EKG/Demand Ischemia -admitted with presumed GI bleed and severe anemia (Hbg 6.1) -Hemoccult is positive.  -Troponins were elevated but flat.   -His EKG demonstrates nonspecific ST-T changes. -echo demonstrates normal LV function with hypokinesis of the mid to apical anterior segment and apical septum.   -He has not had any CP -shortness of breath likely related to his anemia.  -he does have CVD risk factors including new onset diabetes which is just been found. -Overall, this does not represent an acute coronary syndrome.  This represents demand  ischemia in the setting of severe anemia.   -Coronary CTA showed possible high grade obstructive lesion in the LAD and dRCA.  FFR pending -given his severe anemia there is not a lot we can do about this currently.   -would not recommend aspirin or heparin given his life-threatening presumed GI bleed and large colonic mass -  he has been started on high dose statin and LDL was 166 this admit -BB held for tachycardia as elevated HR is normal response to anemia -very difficult situation>>coronary FFR shows high grade flow limiting lesions in the RCA and mild LAD.  This is a high risk scan.  I have discussed case with interventional colleagues.  Given his GI bleed with Hbg as low at 6.1, he is not a candidate for DAPT with PCI until colon resection complete. He would need DAPT for at least a month prior to any surgery which is not an option in this case given large colon mass and severe anemia.  He has not had any chest pin despite significant CAD in setting of severe anemia.  Would recommend proceeding with surgery.  Need to avoid hypotension.   -once cleared from a surgical standpoint for DAPT therapy, will plan cath and PCI. This can be done in outpt setting if he is stable postop  Severe Anemia/Colonic mass -EGD showed esophageal candidiasis -colonoscopy with large fungating ascending colonic mass -surgery consulted -CEA elevated at 12.4 -Hbg 6.1 and up to 10.9 today -plan is for colon resection today  HLD -he has aortic atherosclerosis and small ascending aortic aneurysm on abdominal CTA -LDL goal < 70 -LDL was 166 and started on high dose statin -will need FLP and ALT in 6 weeks  I have spent a total of 30 minutes with patient reviewing 2D echo, coronary CTA, telemetry, EKGs, labs and examining patient as well as establishing an assessment and plan that was discussed with the patient.  > 50% of time was spent in direct patient care.      For questions or updates, please contact [place] Please consult [email or web address] for contact info under        Signed, Fransico Him, MD  09/27/2020, 8:30 AM

## 2020-09-27 NOTE — Anesthesia Procedure Notes (Signed)
Arterial Line Insertion  Patient location: Pre-op. Preanesthetic checklist: patient identified, IV checked, site marked, risks and benefits discussed, surgical consent, monitors and equipment checked, pre-op evaluation, timeout performed and anesthesia consent Lidocaine 1% used for infiltration Left, radial was placed Catheter size: 20 Fr Hand hygiene performed  and maximum sterile barriers used   Attempts: 2 Procedure performed without using ultrasound guided technique. Following insertion, dressing applied and Biopatch. Post procedure assessment: normal and unchanged  Patient tolerated the procedure well with no immediate complications. Additional procedure comments: Unsuccessful attempt x1 by CRNA, attempt x1 by MDA with arterial access obtained.Marland Kitchen

## 2020-09-27 NOTE — Anesthesia Procedure Notes (Signed)
Procedure Name: Intubation Date/Time: 09/27/2020 2:05 PM Performed by: Janace Litten, CRNA Pre-anesthesia Checklist: Patient identified, Emergency Drugs available, Suction available and Patient being monitored Patient Re-evaluated:Patient Re-evaluated prior to induction Oxygen Delivery Method: Circle System Utilized Preoxygenation: Pre-oxygenation with 100% oxygen Induction Type: IV induction Ventilation: Mask ventilation without difficulty Laryngoscope Size: Mac and 4 Grade View: Grade II Tube type: Oral Tube size: 7.5 mm Number of attempts: 1 Airway Equipment and Method: Stylet Placement Confirmation: ETT inserted through vocal cords under direct vision,  positive ETCO2 and breath sounds checked- equal and bilateral Secured at: 24 cm Tube secured with: Tape Dental Injury: Teeth and Oropharynx as per pre-operative assessment

## 2020-09-27 NOTE — Plan of Care (Signed)

## 2020-09-27 NOTE — Op Note (Signed)
09/27/2020  4:16 PM  PATIENT:  Jeff Kelley  50 y.o. male  PRE-OPERATIVE DIAGNOSIS:  RIGHT COLON MASS  POST-OPERATIVE DIAGNOSIS:  RIGHT COLON MASS  PROCEDURE:  Procedure(s): LAPAROSCOPIC ASSISTED  PARTIAL COLECTOMY (N/A)  SURGEON:  Surgeon(s) and Role:    * Jovita Kussmaul, MD - Primary  PHYSICIAN ASSISTANT:   ASSISTANTS: Doristine Mango, PA   ANESTHESIA:   local and general  EBL:  40 mL   BLOOD ADMINISTERED:none  DRAINS: none   LOCAL MEDICATIONS USED:  MARCAINE     SPECIMEN:  Source of Specimen:  terminal ileum and right colon  DISPOSITION OF SPECIMEN:  Pathology  COUNTS:  YES  TOURNIQUET:  * No tourniquets in log *  DICTATION: .Dragon Dictation   After informed consent was obtained the patient was brought to the operating room and placed in the supine position on the operating table.  After adequate induction of general anesthesia the patient's abdomen was prepped with ChloraPrep, allowed to dry, and draped in usual sterile manner.  An appropriate timeout was performed.  The area below the umbilicus was infiltrated with quarter percent Marcaine.  A small incision was made with a 15 blade knife.  The incision was carried through the subcutaneous tissue bluntly with a hemostat until the linea alba was identified.  The linea alba was incised with a 15 blade knife.  Each side was grasped with Kocher clamps.  The preperitoneal space was then probed bluntly with a hemostat until the peritoneum was opened and access was gained to the abdominal cavity.  A 0 Vicryl pursestring stitch was then placed in the fascia surrounding the opening a Hassan cannula was placed through the opening and anchored in place with the Vicryl pursestring stitch.  The abdomen was then insufflated with carbon dioxide without difficulty.  2 other 5 mm ports were placed on the left side of the abdomen under direct vision.  The right colon was then mobilized by incising its retroperitoneal attachment along the  white line of Toldt.  Once the right colon appeared to be very mobile I then made a midline incision with a 10 blade knife.  The incision was opened sharply with the electrocautery completely under direct vision.  The right colon was able to be pulled up into the wound.  The VP shunt was encountered but was tucked down into the pelvis and kept away from the dissection.  I site was chosen on the terminal ileum for division of the small bowel.  The mesentery at this point was opened sharply with the electrocautery.  The terminal ileum was then divided with a single firing of a GIA-75 stapler.  The site was then also chosen at the hepatic flexure for division of the colon.  The mesentery at this point was also opened sharply with the electrocautery.  The colon was divided with a single firing of the GIA-75 stapler.  The mesentery to the right colon was then taken down sharply with the harmonic scalpel.  The main ileocolic vessel to the right colon was then clamped with a Kelly clamp, divided with the harmonic scalpel and then controlled with a 2-0 silk suture ligature.  Once this was accomplished the right colon was removed from the patient with the terminal ileum and was sent to pathology for further evaluation.  We could feel the cancer within the mid right colon.  The terminal ileum and transverse colon reached each other easily.  A small opening was then made on the antimesenteric  border of each limb of colon and small bowel.  Each limb of the GIA-75 stapler was then placed down the appropriate limb of small bowel and colon, clamped, and fired thereby creating a nice widely patent enteroenterostomy.  The common opening was closed with a single firing of a TA 60 stapler.  The staple line was then imbricated with 2-0 silk Lembert stitches as well as a 2-0 silk crotch stitch.  The mesenteric defect was too wide to close and was therefore left open.  The abdomen is then irrigated with copious amounts of saline.  The  anastomosis was widely patent and was placed back into the right upper quadrant.  At this point all gowns and gloves and drapes were changed to new.  The fascia of the anterior abdominal wall was then closed with 2 running #1 double-stranded looped PDS sutures.  The subcutaneous tissue was irrigated with copious amounts of saline.  The skin was closed with staples.  Sterile dressings were applied.  No other abnormalities were noted on general inspection of the abdominal cavity.  The patient tolerated the procedure well.  All needle sponge and instrument counts were correct.  The patient was then awakened and taken to recovery in stable condition.  PLAN OF CARE: Admit to inpatient   PATIENT DISPOSITION:  PACU - hemodynamically stable.   Delay start of Pharmacological VTE agent (>24hrs) due to surgical blood loss or risk of bleeding: no

## 2020-09-28 ENCOUNTER — Encounter (HOSPITAL_COMMUNITY): Payer: Self-pay | Admitting: General Surgery

## 2020-09-28 DIAGNOSIS — R569 Unspecified convulsions: Secondary | ICD-10-CM | POA: Diagnosis not present

## 2020-09-28 DIAGNOSIS — I2510 Atherosclerotic heart disease of native coronary artery without angina pectoris: Secondary | ICD-10-CM

## 2020-09-28 DIAGNOSIS — I2583 Coronary atherosclerosis due to lipid rich plaque: Secondary | ICD-10-CM

## 2020-09-28 DIAGNOSIS — E7800 Pure hypercholesterolemia, unspecified: Secondary | ICD-10-CM | POA: Diagnosis not present

## 2020-09-28 DIAGNOSIS — R778 Other specified abnormalities of plasma proteins: Secondary | ICD-10-CM | POA: Diagnosis not present

## 2020-09-28 DIAGNOSIS — D649 Anemia, unspecified: Secondary | ICD-10-CM | POA: Diagnosis not present

## 2020-09-28 LAB — CBC
HCT: 37.3 % — ABNORMAL LOW (ref 39.0–52.0)
Hemoglobin: 11.1 g/dL — ABNORMAL LOW (ref 13.0–17.0)
MCH: 19.6 pg — ABNORMAL LOW (ref 26.0–34.0)
MCHC: 29.8 g/dL — ABNORMAL LOW (ref 30.0–36.0)
MCV: 66 fL — ABNORMAL LOW (ref 80.0–100.0)
Platelets: 477 10*3/uL — ABNORMAL HIGH (ref 150–400)
RBC: 5.65 MIL/uL (ref 4.22–5.81)
WBC: 21.9 10*3/uL — ABNORMAL HIGH (ref 4.0–10.5)
nRBC: 0 % (ref 0.0–0.2)

## 2020-09-28 LAB — GLUCOSE, CAPILLARY
Glucose-Capillary: 143 mg/dL — ABNORMAL HIGH (ref 70–99)
Glucose-Capillary: 135 mg/dL — ABNORMAL HIGH (ref 70–99)
Glucose-Capillary: 123 mg/dL — ABNORMAL HIGH (ref 70–99)
Glucose-Capillary: 144 mg/dL — ABNORMAL HIGH (ref 70–99)
Glucose-Capillary: 111 mg/dL — ABNORMAL HIGH (ref 70–99)
Glucose-Capillary: 106 mg/dL — ABNORMAL HIGH (ref 70–99)

## 2020-09-28 LAB — BASIC METABOLIC PANEL
Anion gap: 16 — ABNORMAL HIGH (ref 5–15)
BUN: 7 mg/dL (ref 6–20)
CO2: 21 mmol/L — ABNORMAL LOW (ref 22–32)
Calcium: 9.6 mg/dL (ref 8.9–10.3)
Chloride: 96 mmol/L — ABNORMAL LOW (ref 98–111)
Creatinine, Ser: 1.03 mg/dL (ref 0.61–1.24)
GFR, Estimated: >60 mL/min (ref >60)
Glucose, Bld: 138 mg/dL — ABNORMAL HIGH (ref 70–99)
Potassium: 3.9 mmol/L (ref 3.5–5.1)
Sodium: 133 mmol/L — ABNORMAL LOW (ref 135–145)

## 2020-09-28 MED ORDER — FLUCONAZOLE 100MG IVPB
100.0000 mg | INTRAVENOUS | Status: AC
  Administered 2020-09-29 – 2020-10-01 (×3): 100 mg via INTRAVENOUS
  Filled 2020-09-28 (×3): qty 50

## 2020-09-28 MED ORDER — LEVETIRACETAM IN NACL 1500 MG/100ML IV SOLN
1500.0000 mg | INTRAVENOUS | Status: DC
  Filled 2020-09-28: qty 100

## 2020-09-28 MED ORDER — KETOROLAC TROMETHAMINE 30 MG/ML IJ SOLN
15.0000 mg | Freq: Three times a day (TID) | INTRAMUSCULAR | Status: AC
  Administered 2020-09-28 – 2020-10-01 (×8): 15 mg via INTRAVENOUS
  Filled 2020-09-28 (×9): qty 1

## 2020-09-28 MED ORDER — LORAZEPAM 2 MG/ML IJ SOLN
1.0000 mg | Freq: Two times a day (BID) | INTRAMUSCULAR | Status: DC
  Administered 2020-09-29 – 2020-10-02 (×8): 1 mg via INTRAVENOUS
  Filled 2020-09-28 (×9): qty 1

## 2020-09-28 MED ORDER — ACETAMINOPHEN 500 MG PO TABS
1000.0000 mg | ORAL_TABLET | Freq: Three times a day (TID) | ORAL | Status: DC
  Administered 2020-09-28: 1000 mg via ORAL
  Filled 2020-09-28: qty 2

## 2020-09-28 MED ORDER — SODIUM CHLORIDE 0.9 % IV SOLN
100.0000 mg | Freq: Two times a day (BID) | INTRAVENOUS | Status: DC
  Administered 2020-09-29 – 2020-10-03 (×10): 100 mg via INTRAVENOUS
  Filled 2020-09-28 (×16): qty 10

## 2020-09-28 MED ORDER — LEVETIRACETAM IN NACL 1500 MG/100ML IV SOLN
1500.0000 mg | Freq: Two times a day (BID) | INTRAVENOUS | Status: DC
  Administered 2020-09-29 – 2020-10-03 (×11): 1500 mg via INTRAVENOUS
  Filled 2020-09-28 (×15): qty 100

## 2020-09-28 MED ORDER — LORAZEPAM 2 MG/ML IJ SOLN: 2.0000 mg | Freq: Once | INTRAMUSCULAR | Status: DC | PRN

## 2020-09-28 MED ORDER — ACETAMINOPHEN 10 MG/ML IV SOLN
1000.0000 mg | Freq: Four times a day (QID) | INTRAVENOUS | Status: AC
  Administered 2020-09-28 – 2020-09-29 (×3): 1000 mg via INTRAVENOUS
  Filled 2020-09-28 (×4): qty 100

## 2020-09-28 MED ORDER — SODIUM CHLORIDE 0.9 % IV SOLN
750.0000 mg | INTRAVENOUS | Status: DC
  Filled 2020-09-28: qty 7.5

## 2020-09-28 MED ORDER — ENOXAPARIN SODIUM 40 MG/0.4ML ~~LOC~~ SOLN
40.0000 mg | SUBCUTANEOUS | Status: DC
  Administered 2020-09-28 – 2020-10-04 (×7): 40 mg via SUBCUTANEOUS
  Filled 2020-09-28 (×7): qty 0.4

## 2020-09-28 MED ORDER — FENTANYL CITRATE (PF) 100 MCG/2ML IJ SOLN
50.0000 ug | INTRAMUSCULAR | Status: DC | PRN
  Administered 2020-09-28: 50 ug via INTRAVENOUS
  Filled 2020-09-28: qty 2

## 2020-09-28 MED ORDER — SODIUM CHLORIDE 0.9 % IV SOLN
200.0000 mg | Freq: Once | INTRAVENOUS | Status: AC
  Administered 2020-09-28: 200 mg via INTRAVENOUS
  Filled 2020-09-28: qty 20

## 2020-09-28 MED ORDER — ACETAMINOPHEN 500 MG PO TABS
1000.0000 mg | ORAL_TABLET | Freq: Three times a day (TID) | ORAL | Status: DC
  Administered 2020-09-29 – 2020-10-05 (×18): 1000 mg via ORAL
  Filled 2020-09-28 (×18): qty 2

## 2020-09-28 MED ORDER — PANTOPRAZOLE SODIUM 40 MG IV SOLR
40.0000 mg | INTRAVENOUS | Status: DC
  Administered 2020-09-29 – 2020-10-04 (×6): 40 mg via INTRAVENOUS
  Filled 2020-09-28 (×6): qty 40

## 2020-09-28 MED ORDER — POTASSIUM CHLORIDE IN NACL 20-0.9 MEQ/L-% IV SOLN
INTRAVENOUS | Status: DC
  Filled 2020-09-28 (×11): qty 1000

## 2020-09-28 MED ORDER — PHENOBARBITAL SODIUM 65 MG/ML IJ SOLN
65.0000 mg | Freq: Two times a day (BID) | INTRAMUSCULAR | Status: DC
  Administered 2020-09-29 – 2020-10-04 (×11): 65 mg via INTRAVENOUS
  Filled 2020-09-28 (×11): qty 1

## 2020-09-28 MED ORDER — ATORVASTATIN CALCIUM 80 MG PO TABS
80.0000 mg | ORAL_TABLET | Freq: Every day | ORAL | Status: DC
  Administered 2020-10-01 – 2020-10-05 (×5): 80 mg via ORAL
  Filled 2020-09-28 (×6): qty 1

## 2020-09-28 MED ORDER — SODIUM CHLORIDE 0.9 % IV SOLN: 1125.0000 mg | Freq: Two times a day (BID) | INTRAVENOUS | Status: DC

## 2020-09-28 MED ORDER — FERROUS SULFATE 325 (65 FE) MG PO TABS
325.0000 mg | ORAL_TABLET | Freq: Every day | ORAL | Status: DC
  Administered 2020-10-01 – 2020-10-03 (×3): 325 mg via ORAL
  Filled 2020-09-28 (×4): qty 1

## 2020-09-28 MED ORDER — FLUCONAZOLE 100MG IVPB: 100.0000 mg | INTRAVENOUS | Status: DC

## 2020-09-28 NOTE — Progress Notes (Addendum)
1 Day Post-Op    CC:  Subjective: Entereg not give pre op.  He wants his NG out, he says he can't take seizure medicine with it in.  I can't see where he's got anything for pain except for Tylenol and not much of that.  900 recorded from the NG yesterday, what is in the cannister is mostly yellow.  Abdomen is tender, no BS, foley in place.  He is getting some pills down.  NO IS in the room.  Objective: Vital signs in last 24 hours: Temp:  [97.6 F (36.4 C)-98.6 F (37 C)] 98.6 F (37 C) (03/17 2225) Pulse Rate:  [89-129] 129 (03/17 2225) Resp:  [15-18] 18 (03/17 2225) BP: (142-159)/(90-104) 159/104 (03/17 2225) SpO2:  [92 %-96 %] 92 % (03/17 2225) Arterial Line BP: (123-133)/(66-70) 125/70 (03/17 1725) Weight:  [87.4 kg] 87.4 kg (03/17 1241) Last BM Date: 09/25/20 30 PO 3200 IV -  1355 urine 900 NG Afebrile, VSS last BP 159/104@ 1900 last PM     Intake/Output from previous day: 03/17 0701 - 03/18 0700 In: 2332.8 [P.O.:30; I.V.:1000; Blood:630; IV Piggyback:672.8] Out: 2295 [Urine:1355; Emesis/NG output:900; Blood:40] Intake/Output this shift: No intake/output data recorded.  General appearance: alert, cooperative, no distress and wants NG out Resp: clear to auscultation bilaterally GI: sore and tender, no BS, NO BM, midline incision dressing is dry  Lab Results:  Recent Labs    09/27/20 0420 09/28/20 0334  WBC 9.5 21.9*  HGB 10.9* 11.1*  HCT 36.0* 37.3*  PLT 431* 477*    BMET Recent Labs    09/27/20 0420 09/28/20 0334  NA 135 133*  K 3.7 3.9  CL 99 96*  CO2 25 21*  GLUCOSE 143* 138*  BUN <5* 7  CREATININE 0.85 1.03  CALCIUM 9.5 9.6   PT/INR Recent Labs    09/26/20 0555  LABPROT 14.1  INR 1.1    Recent Labs  Lab 09/25/20 0228  AST 13*  ALT 12  ALKPHOS 70  BILITOT 0.6  PROT 6.2*  ALBUMIN 2.9*     Lipase  No results found for: LIPASE   Medications: . alvimopan  12 mg Oral BID  . atorvastatin  80 mg Oral Daily  . Cenobamate  100  mg Oral Daily  . Chlorhexidine Gluconate Cloth  6 each Topical Daily  . ferrous sulfate  325 mg Oral Q breakfast  . fluconazole  100 mg Oral Daily  . insulin aspart  0-6 Units Subcutaneous Q4H  . lamoTRIgine  400 mg Oral BID  . levETIRAcetam  1,125 mg Oral BID  . LORazepam  1 mg Oral BID  . pantoprazole  40 mg Oral Daily  . PHENobarbital  64.8 mg Oral BID   . sodium chloride    . sodium chloride    . methocarbamol (ROBAXIN) IV      Assessment/Plan Hx of Seizures 4.4 cm ascending thoracic aortic aneurysm VP shunt in place HLD DM - Hbg A1c 9.6 Elevated Tnon admission - Coronary CTA showed possible high grade lesion in LAD and distal RCA and coronary FFR now shows significant flow limiting lesions in the mid RCA and mid LAD. Per cardiology he is not a candidate for DAPT with PCIuntil colon resection complete. Would recommend proceeding with surgery. Need to avoid hypotension. Once cleared from a surgical standpoint for DAPT therapy, will plan cath and PCI. This can be done in outpt setting if he is stable postop CAD Severe atherosclerosis Esophageal plaques- noted on endoscopy, brushing taken  by Dr. Collene Mares ABL anemia- likely 2/2 GI loss. hgb 6.1 on admission,10.9>>11.1  - transfused 3/11 2 units; 3/12 - 1 unit; 3/15 - 2 units Moderate malnutrition - Prealbumin 16.2  Colon Mass - Endoscopy showed a large malignant tumor in the proximal ascending colon close to the ICVthat wasbiopsied.This also showed a6 mm sessile polyp in the sigmoid colonand a10 mm sessile polyp at the hepatic flexure that were retrieved with snare. - Pathology: polyps benign, right colon mass adenocarcinoma - CT CAPordered and showed5.0 x 1.4 x 3.7 cm eccentric intraluminal and transmural proximal colonic mass highly concerning for colon cancer, no evidence of obstruction; multiplesubcentimeter lymph nodes within the right lower quadrant mesentery - CEA12.4 Laparoscopic assisted partial colectomy  09/27/20, Dr. Autumn Messing III POD#1  - WBC 9.5>> 21.9 FEN -NPO/ no IV fluids listed VTE -SCDs ID -diflucan 3/14>>  Plan:  IV fluids, clamp NG and see how he does, recheck this PM.  give scheduled pain medicines, OOB, IS, recheck labs in AM.  Add SCD,start Lovenox later today, 1600.  DC foley. I left an order to put pill he can't swallow down the NG of [place] with pharmacy.  Reviewed with Dr. Marlou Starks, continue NG, he needs to be NPO and make medicines IV until he has return of bowel function.        LOS: 7 days    Bear Osten 09/28/2020 Please see Amion

## 2020-09-28 NOTE — Progress Notes (Addendum)
Progress Note  Patient Name: Jeff Kelley Date of Encounter: 09/28/2020  [facility] Cardiologist: Evalina Field, MD   Subjective   Admitted with SOB and found to be severely anemic with Hbg 6.1. Initial hsTrop 109>>110.  EKG abnormal on admit with ? AS MI and nonspecific ST/T wave abnormality and 2D echo with normal LVF and HK of mid to distal AS and apical anterior wall.   EGD with possible candidiasis.  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 Surgery consulted but have not seen yet.  Coronary CTA showed possible high grade lesion in LAD and distal RCA and coronary FFR now shows significant flow limiting lesions in the mid RCA and mid LAD.    S/P partial bowel resection and doing well from cardiac standpoint.  Denies any anginal symptoms.   Inpatient Medications    Scheduled Meds: . alvimopan  12 mg Oral BID  . atorvastatin  80 mg Oral Daily  . Cenobamate  100 mg Oral Daily  . Chlorhexidine Gluconate Cloth  6 each Topical Daily  . ferrous sulfate  325 mg Oral Q breakfast  . fluconazole  100 mg Oral Daily  . insulin aspart  0-6 Units Subcutaneous Q4H  . lamoTRIgine  400 mg Oral BID  . levETIRAcetam  1,125 mg Oral BID  . LORazepam  1 mg Oral BID  . pantoprazole  40 mg Oral Daily  . PHENobarbital  64.8 mg Oral BID   Continuous Infusions: . sodium chloride    . sodium chloride    . methocarbamol (ROBAXIN) IV     PRN Meds: sodium chloride, acetaminophen, fentaNYL (SUBLIMAZE) injection, methocarbamol (ROBAXIN) IV, mineral oil-hydrophilic petrolatum, ondansetron (ZOFRAN) IV, oxyCODONE   Vital Signs    Vitals:   09/27/20 1725 09/27/20 1740 09/27/20 1802 09/27/20 2225  BP: (!) 143/92 (!) 148/96 (!) 152/104 (!) 159/104  Pulse: 90 91  (!) 129  Resp: 17 18 18 18   Temp:  98 F (36.7 C) 97.9 F (36.6 C) 98.6 F (37 C)  TempSrc:   Oral Oral  SpO2: 95% 93% 93% 92%  Weight:      Height:        Intake/Output Summary (Last 24 hours)  at 09/28/2020 9702 Last data filed at 09/28/2020 6378 Gross per 24 hour  Intake 2332.83 ml  Output 2295 ml  Net 37.83 ml   Last 3 Weights 09/27/2020 09/26/2020 09/23/2020  Weight (lbs) 192 lb 10.9 oz 192 lb 10.9 oz 191 lb 8 oz  Weight (kg) 87.4 kg 87.4 kg 86.864 kg      Telemetry    NSR- Personally Reviewed  ECG    No new EKG to review - Personally Reviewed  Physical Exam   GEN: Well nourished, well developed in no acute distress HEENT: Normal NECK: No JVD; No carotid bruits LYMPHATICS: No lymphadenopathy CARDIAC:RRR, no murmurs, rubs, gallops RESPIRATORY:  Clear to auscultation without rales, wheezing or rhonchi  ABDOMEN: Soft MUSCULOSKELETAL:  No edema; No deformity  SKIN: Warm and dry NEUROLOGIC:  Alert and oriented x 3 PSYCHIATRIC:  Normal affect    Labs    High Sensitivity Troponin:   Recent Labs  Lab 09/21/20 2051 09/21/20 2245  TROPONINIHS 109* 110*      Chemistry Recent Labs  Lab 09/25/20 0228 09/25/20 1647 09/26/20 0555 09/27/20 0420 09/28/20 0334  NA 135   < > 138 135 133*  K 3.3*   < > 3.8 3.7 3.9  CL 103   < >  103 99 96*  CO2 24   < > 26 25 21*  GLUCOSE 117*   < > 120* 143* 138*  BUN 7   < > <5* <5* 7  CREATININE 0.70   < > 0.78 0.85 1.03  CALCIUM 8.8*   < > 9.2 9.5 9.6  PROT 6.2*  --   --   --   --   ALBUMIN 2.9*  --   --   --   --   AST 13*  --   --   --   --   ALT 12  --   --   --   --   ALKPHOS 70  --   --   --   --   BILITOT 0.6  --   --   --   --   GFRNONAA >60   < > >60 >60 >60  ANIONGAP 8   < > 9 11 16*   < > = values in this interval not displayed.     Hematology Recent Labs  Lab 09/26/20 0555 09/27/20 0420 09/28/20 0334  WBC 10.0 9.5 21.9*  RBC 5.49 5.52 5.65  HGB 10.6* 10.9* 11.1*  HCT 36.4* 36.0* 37.3*  MCV 66.3* 65.2* 66.0*  MCH 19.3* 19.7* 19.6*  MCHC 29.1* 30.3 29.8*  RDW 30.0* 30.5* Not Measured  PLT 411* 431* 477*    BNP Recent Labs  Lab 09/21/20 2051  BNP 297.0*     DDimer No results for input(s):  DDIMER in the last 168 hours.   Radiology    CT CORONARY FRACTIONAL FLOW RESERVE DATA PREP  Result Date: 09/27/2020 EXAM: FFRCT ANALYSIS FINDINGS: FFRct analysis was performed on the original cardiac CT angiogram dataset. Diagrammatic representation of the FFRct analysis is provided in a separate PDF document in PACS. This dictation was created using the PDF document and an interactive 3D model of the results. 3D model is not available in the EMR/PACS. Normal FFR range is >0.80. 1. Left Main: No significant stenosis. LM FFR = 0.99. 2. LAD: Possible significant stenosis in mid LAD. Proximal FFR = 0.98, Mid FFR = 0.52, Distal FFR = <0.5. 3. LCX: No significant stenosis. Proximal FFR = 0.98, Mid FFR = 0.95. Distal FFR could not be evaluated 4. RCA: Possible significant stenosis in the distal RCA. Proximal FFR = 0.98, Mid FFR = 0.91, Distal FFR = 0.64. IMPRESSION: 1. Coronary CTA FFR flow analysis demonstrates possible hemodynamically significant flow limiting lesions in the mid LAD and distal RCA. 2.   Recommend Cardiac Cath. Fransico Him Electronically Signed   By: Fransico Him   On: 09/27/2020 13:49    Cardiac Studies   2D echo 09/22/2020 IMPRESSIONS   1. Left ventricular ejection fraction by 3D volume is 56 %. The left  ventricle has normal function. The left ventricle demonstrates regional  wall motion abnormalities (see scoring diagram/findings for description).  Left ventricular diastolic parameters  are consistent with Grade II diastolic dysfunction (pseudonormalization).  2. Right ventricular systolic function is normal. The right ventricular  size is normal. Tricuspid regurgitation signal is inadequate for assessing  PA pressure.  3. The mitral valve is normal in structure. Trivial mitral valve  regurgitation. No evidence of mitral stenosis.  4. The aortic valve is grossly normal. Aortic valve regurgitation is not  visualized. No aortic stenosis is present.  5. Aortic dilatation  noted. There is mild dilatation of the aortic root,  measuring 40 mm.  6. The inferior vena cava is  normal in size with greater than 50%  respiratory variability, suggesting right atrial pressure of 3 mmHg.   Patient Profile     50 y.o. male with a hx of seizure disorder, diabetes (found this admission), obesity who is being seen  for the evaluation of abnormal echocardiogram at the request of Irene Pap, MD.  Assessment & Plan    Abnormal EKG/Demand Ischemia -admitted with presumed GI bleed and severe anemia (Hbg 6.1) -Hemoccult is positive.  -Troponins were elevated but flat.   -His EKG demonstrates nonspecific ST-T changes. -echo demonstrates normal LV function with hypokinesis of the mid to apical anterior segment and apical septum.   -He has not had any CP -shortness of breath likely related to his anemia.  -he does have CVD risk factors including new onset diabetes which is just been found. -Overall, this does not represent an acute coronary syndrome.  This represents demand ischemia in the setting of severe anemia.   -Coronary CTA showed possible high grade obstructive lesion in the LAD and dRCA.  FFR pending -given his severe anemia there is not a lot we can do about this currently.   -would not recommend aspirin or heparin given his life-threatening presumed GI bleed and large colonic mass -he has been started on high dose statin and LDL was 166 this admit -BB held for tachycardia as elevated HR is normal response to anemia -did well with surgery and no complaints of angina -once cleared from a surgical standpoint for DAPT therapy, will plan cath and PCI. This can be done in outpt setting if he is stable postop -continue statin -add Lopressor 25mg  BID due to elevated BP  Severe Anemia/Colonic mass -EGD showed esophageal candidiasis -colonoscopy with large fungating ascending colonic mass -surgery consulted -CEA elevated at 12.4 -Hbg 6.1 and up to 10.9 today -s/p colon  resection POD#1  HLD -he has aortic atherosclerosis and small ascending aortic aneurysm on abdominal CTA -LDL goal < 70 -LDL was 166 and started on high dose statin -will need FLP and ALT in 6 weeks     For questions or updates, please contact [facility] Please consult [email or web address] for contact info under        Signed, Fransico Him, MD  09/28/2020, 8:23 AM

## 2020-09-28 NOTE — Progress Notes (Signed)
PROGRESS NOTE    Jeff Kelley  IPJ:[id number] DOB: 04-Nov-1970 DOA: 09/21/2020 PCP: Patient, No Pcp Per   Brief Narrative:  50 y.o.malewith medical history significant forseizure disorder and anxiety, now presenting to the emergency department for evaluation of shortness of breath. Patient began to notice shortness of breath with exertion approximately 1 month ago, slowly progressed to the point where he was dyspneic at rest.  Upon admission patient was noted to be anemic with hemoglobin of 6.1 requiring transfusion.  Underwent EGD/colonoscopy in 09/24/2020. Discussed with GI Dr. Collene Mares findings concerning for esophageal candidiasis and a large mass in the colon.    Elevated CEA.    Chest abdomen pelvis done for staging..  Started gentle IV fluid hydration to prevent contrast-induced nephropathy.    Cardiology, GI and general surgery has been consulted.  Underwent partial colectomy 3/17.   Assessment & Plan:   Principal Problem:   Symptomatic anemia Active Problems:   Seizures (HCC)   Occult GI bleeding   Elevated troponin   CHF (congestive heart failure) (HCC)   Hyperglycemia   Iron deficiency anemia   Hyponatremia   Abnormal echocardiogram   New diagnosis of adenocarcinoma: Status post partial colectomy 3/17 Symptomatic iron deficiency anemia -Initially had dyspnea on exertion and shortness of breath from anemia. -Endoscopy showed esophageal Candida therefore started on Diflucan 100 mg for 7 days -Colonoscopy showed multiple sessile polyp which was removed and a large malignant tumor status post biopsy- Adenocarcinoma -CEA levels elevated -CT chest abdomen pelvis-intraluminal and transmural proximal colonic mass seen, multiple subcentimeter lymph nodes, some groundglass opacities. -Status post partial colectomy 3/17 -Dr. Learta Codding has arranged for outpatient follow-up appointment in 3 weeks Diet per Gen Surgery  Esophageal Candida Started p.o. Diflucan 100 mg daily x7 days.   Last day 3/20  Newly diagnosed ascending aortic aneurysm measuring 4.4 cm Patient made aware of this finding and of recommendations.  ACCF/AHA guidelines. Recommended annual imaging follow-up by CTA or MRA.  Severe symptomatic iron deficiency anemia with positive FOBT likely secondary to chronic blood loss in the setting of large malignant colon mass. Secondary to iron deficiency.  Initially received PRBC transfusion.  Hemoglobin stable.  Monitor postoperatively.  Elevated troponin likely secondary to demand ischemia in the setting of severe anemia Diastolic CHF/mid and distal anterior septum, apical anterior segments are hypokinetic. Overall echocardiogram is unremarkable.  CT coronary showed elevated calcium scores.  High FFR.  Eventually will require cardiac catheterization.  Will defer timing to cardiology. Continue Lipitor 2D echo showing LVEF 56% left ventricle demonstrate regional wall motion abnormalities, left ventricle diastolic parameters are consistent with grade 2 diastolic dysfunction. Cardiology following.   Type 2 diabetes with hyperglycemia Hemoglobin A1c 9.6 on 09/30/2020 Continue insulin sliding scale. Continue to monitor CBGs  Seizure disorder Continue home regimen. Seizures precautions.  Chronic anxiety Continue home regimen.    DVT prophylaxis: SCD's Start: 09/27/20 1826 SCDs Start: 09/22/20 0020  Code Status: Full code Family Communication:    Status is: Inpatient  Remains inpatient appropriate because:Inpatient level of care appropriate due to severity of illness   Dispo:  Patient From: Home  Planned Disposition: Home  Medically stable for discharge: No.  Currently postop, routine recovery.  Management per general surgery.      Body mass index is 31.11 kg/m.       Subjective: Underwent laparoscopic partial colectomy 3/17 which he tolerated well. Sitting at the side of the bed, doing ok.  Feels weak overall as expected.    Review  of Systems Otherwise negative except as per HPI, including: General = no fevers, chills, dizziness,  fatigue HEENT/EYES = negative for loss of vision, double vision, blurred vision,  sore throa Cardiovascular= negative for chest pain, palpitation Respiratory/lungs= negative for shortness of breath, cough, wheezing; hemoptysis,  Gastrointestinal= negative for nausea, vomiting, abdominal pain Genitourinary= negative for Dysuria MSK = Negative for arthralgia, myalgias Neurology= Negative for headache, numbness, tingling  Psychiatry= Negative for suicidal and homocidal ideation Skin= Negative for Rash   Examination: Constitutional: Not in acute distress/ NG Tube in place.  Respiratory: Clear to auscultation bilaterally Cardiovascular: Normal sinus rhythm, no rubs Abdomen: Nontender nondistended good bowel sounds Musculoskeletal: No edema noted Skin: surgical scar on abd noted  Neurologic: CN 2-12 grossly intact.  And nonfocal Psychiatric: Normal judgment and insight. Alert and oriented x 3. Normal mood.      Objective: Vitals:   09/27/20 1725 09/27/20 1740 09/27/20 1802 09/27/20 2225  BP: (!) 143/92 (!) 148/96 (!) 152/104 (!) 159/104  Pulse: 90 91  (!) 129  Resp: 17 18 18 18   Temp:  98 F (36.7 C) 97.9 F (36.6 C) 98.6 F (37 C)  TempSrc:   Oral Oral  SpO2: 95% 93% 93% 92%  Weight:      Height:        Intake/Output Summary (Last 24 hours) at 09/28/2020 0917 Last data filed at 09/28/2020 0328 Gross per 24 hour  Intake 2302.83 ml  Output 2295 ml  Net 7.83 ml   Filed Weights   09/23/20 0830 09/26/20 0500 09/27/20 1241  Weight: 86.9 kg 87.4 kg 87.4 kg     Data Reviewed:   CBC: Recent Labs  Lab 09/24/20 0323 09/25/20 0228 09/26/20 0555 09/27/20 0420 09/28/20 0334  WBC 11.6* 9.3 10.0 9.5 21.9*  HGB 8.6* 7.5* 10.6* 10.9* 11.1*  HCT 30.9* 26.9* 36.4* 36.0* 37.3*  MCV 61.9* 62.1* 66.3* 65.2* 66.0*  PLT 535* 428* 411* 431* 098*   Basic Metabolic  Panel: Recent Labs  Lab 09/25/20 0228 09/25/20 1647 09/26/20 0555 09/27/20 0420 09/28/20 0334  NA 135 132* 138 135 133*  K 3.3* 3.8 3.8 3.7 3.9  CL 103 100 103 99 96*  CO2 24 22 26 25  21*  GLUCOSE 117* 126* 120* 143* 138*  BUN 7 5* <5* <5* 7  CREATININE 0.70 0.71 0.78 0.85 1.03  CALCIUM 8.8* 8.9 9.2 9.5 9.6  MG 2.0 1.9 1.9  --   --   PHOS  --  3.4  --   --   --    GFR: Estimated Creatinine Clearance: 88.8 mL/min (by C-G formula based on SCr of 1.03 mg/dL). Liver Function Tests: Recent Labs  Lab 09/25/20 0228  AST 13*  ALT 12  ALKPHOS 70  BILITOT 0.6  PROT 6.2*  ALBUMIN 2.9*   No results for input(s): LIPASE, AMYLASE in the last 168 hours. No results for input(s): AMMONIA in the last 168 hours. Coagulation Profile: Recent Labs  Lab 09/26/20 0555  INR 1.1   Cardiac Enzymes: No results for input(s): CKTOTAL, CKMB, CKMBINDEX, TROPONINI in the last 168 hours. BNP (last 3 results) No results for input(s): PROBNP in the last 8760 hours. HbA1C: No results for input(s): HGBA1C in the last 72 hours. CBG: Recent Labs  Lab 09/27/20 1120 09/27/20 2019 09/27/20 2355 09/28/20 0335 09/28/20 0734  GLUCAP 111* 141* 114* 143* 135*   Lipid Profile: No results for input(s): CHOL, HDL, LDLCALC, TRIG, CHOLHDL, LDLDIRECT in the last 72 hours. Thyroid Function Tests: No  results for input(s): TSH, T4TOTAL, FREET4, T3FREE, THYROIDAB in the last 72 hours. Anemia Panel: No results for input(s): VITAMINB12, FOLATE, FERRITIN, TIBC, IRON, RETICCTPCT in the last 72 hours. Sepsis Labs: No results for input(s): PROCALCITON, LATICACIDVEN in the last 168 hours.  Recent Results (from the past 240 hour(s))  SARS CORONAVIRUS 2 (TAT 6-24 HRS) Nasopharyngeal Nasopharyngeal Swab     Status: None   Collection Time: 09/21/20  8:45 PM   Specimen: Nasopharyngeal Swab  Result Value Ref Range Status   SARS Coronavirus 2 NEGATIVE NEGATIVE Final    Comment: (NOTE) SARS-CoV-2 target nucleic acids  are NOT DETECTED.  The SARS-CoV-2 RNA is generally detectable in upper and lower respiratory specimens during the acute phase of infection. Negative results do not preclude SARS-CoV-2 infection, do not rule out co-infections with other pathogens, and should not be used as the sole basis for treatment or other patient management decisions. Negative results must be combined with clinical observations, patient history, and epidemiological information. The expected result is Negative.  Fact Sheet for Patients: SugarRoll.be  Fact Sheet for Healthcare Providers: https://www.woods-mathews.com/  This test is not yet approved or cleared by the [place] FDA and  has been authorized for detection and/or diagnosis of SARS-CoV-2 by FDA under an Emergency Use Authorization (EUA). This EUA will remain  in effect (meaning this test can be used) for the duration of the COVID-19 declaration under Se ction 564(b)(1) of the Act, 21 U.S.C. section 360bbb-3(b)(1), unless the authorization is terminated or revoked sooner.  Performed at [facility], [place] [address], [place], [place] [address]   MRSA PCR Screening     Status: None   Collection Time: 09/22/20  4:25 AM   Specimen: Nasopharyngeal  Result Value Ref Range Status   MRSA by PCR NEGATIVE NEGATIVE Final    Comment:        The GeneXpert MRSA Assay (FDA approved for NASAL specimens only), is one component of a comprehensive MRSA colonization surveillance program. It is not intended to diagnose MRSA infection nor to guide or monitor treatment for MRSA infections. Performed at [facility], [place] [address] [place], [place] [address]          Radiology Studies: No results found.      Scheduled Meds: . atorvastatin  80 mg Oral Daily  . Cenobamate  100 mg Oral Daily  . Chlorhexidine Gluconate Cloth  6 each Topical Daily  . ferrous sulfate  325 mg Oral Q breakfast  .  fluconazole  100 mg Oral Daily  . insulin aspart  0-6 Units Subcutaneous Q4H  . lamoTRIgine  400 mg Oral BID  . levETIRAcetam  1,125 mg Oral BID  . LORazepam  1 mg Oral BID  . pantoprazole  40 mg Oral Daily  . PHENobarbital  64.8 mg Oral BID   Continuous Infusions: . sodium chloride    . sodium chloride    . methocarbamol (ROBAXIN) IV       LOS: 7 days   Time spent= 35 mins    Kynzlee Hucker Arsenio Loader, MD [facility]  If 7PM-7AM, please contact night-coverage  09/28/2020, 9:17 AM

## 2020-09-28 NOTE — Consult Note (Addendum)
NEUROLOGY CONSULTATION NOTE   Date of service: September 28, 2020 Patient Name: Jeff Kelley MRN:  [id number] DOB:  15-May-1971 Reason for consult: "AED management" _ _ _   _ __   _ __ _ _  __ __   _ __   __ _  History of Present Illness  Jeff Kelley is a 50 y.o. male with PMH significant for seizures on Cenobamate 100mg  daily, Lamictal 400 mg twice daily, Keppra 1500 mg twice daily but was being tapered down outpatient and phenobarbital 64.8 mg 2 times daily who is admitted with symptomatic anemia and underwent partial colectomy for a Right colon mass. He is NPO s/p surgery and neurology consulted to assist with management of AEDs.  He is somnolent on pain medications but reports history of both GTCs and partial seizures. Has not have a GTC in decades on phenopbarb. Was recently started on Cenobamate with plan to switch from Keppra to [facility]. Denies any side effects from [place] thou. Reports having focal seizures about once a week on average, more if he is stressed.   ROS   Constitutional Denies weight loss, fever and chills.  HEENT Denies changes in vision and hearing.   Respiratory Denies SOB and cough.   CV Denies palpitations and CP   GI + abdominal pain, no nausea  GU Denies dysuria and urinary frequency.   MSK Denies myalgia and joint pain.   Skin Denies rash and pruritus.   Neurological Denies headache and syncope.   Psychiatric Denies recent changes in mood. Denies anxiety and depression.    Past History   Past Medical History:  Diagnosis Date  . Seizures ([place])    Past Surgical History:  Procedure Laterality Date  . BIOPSY  09/24/2020   Procedure: BIOPSY;  Surgeon: Juanita Craver, MD;  Location: [place];  Service: Endoscopy;;  . COLONOSCOPY WITH PROPOFOL N/A 09/24/2020   Procedure: COLONOSCOPY WITH PROPOFOL;  Surgeon: Juanita Craver, MD;  Location: [facility] ENDOSCOPY;  Service: Endoscopy;  Laterality: N/A;  . ESOPHAGEAL BRUSHING  09/24/2020   Procedure: ESOPHAGEAL  BRUSHING;  Surgeon: Juanita Craver, MD;  Location[facility] ENDOSCOPY;  Service: Endoscopy;;  . ESOPHAGOGASTRODUODENOSCOPY (EGD) WITH PROPOFOL N/A 09/24/2020   Procedure: ESOPHAGOGASTRODUODENOSCOPY (EGD) WITH PROPOFOL;  Surgeon: Juanita Craver, MD;  Location: [facility] ENDOSCOPY;  Service: Endoscopy;  Laterality: N/A;  . LAPAROSCOPIC PARTIAL COLECTOMY N/A 09/27/2020   Procedure: LAPAROSCOPIC ASSISTED  PARTIAL COLECTOMY;  Surgeon: Jovita Kussmaul, MD;  Location: [place];  Service: General;  Laterality: N/A;  . POLYPECTOMY  09/24/2020   Procedure: POLYPECTOMY;  Surgeon: Juanita Craver, MD;  Location: [facility] ENDOSCOPY;  Service: Endoscopy;;   Family History  Problem Relation Age of Onset  . Heart attack Mother   . Diabetes Mother   . Lung cancer Father    Social History   Socioeconomic History  . Marital status: Unknown    Spouse name: Not on file  . Number of children: Not on file  . Years of education: Not on file  . Highest education level: Not on file  Occupational History  . Not on file  Tobacco Use  . Smoking status: Former Research scientist (life sciences)  . Smokeless tobacco: Never Used  Substance and Sexual Activity  . Alcohol use: Not Currently  . Drug use: Not Currently  . Sexual activity: Not on file  Other Topics Concern  . Not on file  Social History Narrative  . Not on file   Social Determinants of Health   Financial Resource Strain: Not  on file  Food Insecurity: Not on file  Transportation Needs: Not on file  Physical Activity: Not on file  Stress: Not on file  Social Connections: Not on file   Allergies  Allergen Reactions  . Codeine Other (See Comments)    Patient states he was told he was allergic when he was [age] years old, unknown reaction.  Marland Kitchen Penicillins Other (See Comments)    Patient states he was told he was allergic when he was [age] years ago, unknown reaction    Medications   Medications Prior to Admission  Medication Sig Dispense Refill Last Dose  . acetaminophen (TYLENOL) 500 MG  tablet Take 1,000 mg by mouth every 6 (six) hours as needed for headache (pain).   09/19/2020  . Cenobamate 14 x 50 MG & 14 x100 MG TBPK Take 50-100 mg by mouth See admin instructions. Take one 50 mg tablet daily for 2 weeks, then take one 100 mg tablet daily for 2 weeks, then fill 3rd month's pack   09/21/2020 at am  . Cholecalciferol (VITAMIN D3) 125 MCG (5000 UT) CAPS Take 5,000 Units by mouth daily.   09/21/2020 at am  . esomeprazole (NEXIUM) 20 MG capsule Take 20 mg by mouth daily at 12 noon.   09/21/2020 at am  . lamoTRIgine (LAMICTAL) 200 MG tablet Take 400 mg by mouth 2 (two) times daily.   09/21/2020 at afternoon  . levETIRAcetam (KEPPRA) 750 MG tablet Take 1,500 mg by mouth See admin instructions. Previous dose 2 tablets (1500 mg) by mouth twice daily - reduce dose starting 09/21/2020 - take 1 1/2 tablets (1125 mg) by mouth twice daily for 3-4 weeks then take 1 tablet (750 mg) twice daily, then consult MD for further instructions.   09/21/2020 at afternoon  . LORazepam (ATIVAN) 1 MG tablet Take 1 mg by mouth 2 (two) times daily.   09/21/2020 at afternoon  . Multiple Vitamin (MULTIVITAMIN WITH MINERALS) TABS tablet Take 1 tablet by mouth daily.   09/21/2020 at am  . PHENobarbital (LUMINAL) 64.8 MG tablet Take 64.8 mg by mouth 2 (two) times daily.   09/21/2020 at am     Vitals   Vitals:   09/27/20 1802 09/27/20 2225 09/28/20 1413 09/28/20 2112  BP: (!) 152/104 (!) 159/104 108/71 138/65  Pulse:  (!) 129 (!) 103 99  Resp: 18 18 16 20   Temp: 97.9 F (36.6 C) 98.6 F (37 C)  98.6 F (37 C)  TempSrc: Oral Oral  Oral  SpO2: 93% 92% 95% 96%  Weight:      Height:         Body mass index is 31.11 kg/m.  Physical Exam   General: Laying comfortably in bed; in no acute distress.  HENT: Normal oropharynx and mucosa. Normal external appearance of ears and nose.  Neck: Supple, no pain or tenderness  CV: No JVD. No peripheral edema.  Pulmonary: Symmetric Chest rise. Normal respiratory effort.   Abdomen: Soft to touch, non-tender.  Ext: No cyanosis, edema, or deformity  Skin: No rash. Normal palpation of skin.   Musculoskeletal: Normal digits and nails by inspection. No clubbing.   Neurologic Examination  Mental status/Cognition: somnolent, opens eyes partially to voice, engages appropriately with exam, oriented to self, place, month and year, good attention.  Speech/language: Fluent, comprehension intact, object naming intact, repetition intact.  Cranial nerves:   CN II Pupils equal and reactive to light, no VF deficits    CN III,IV,VI EOM intact, no gaze preference or  deviation, no nystagmus    CN V normal sensation in V1, V2, and V3 segments bilaterally    CN VII no asymmetry, no nasolabial fold flattening    CN VIII normal hearing to speech    CN IX & X normal palatal elevation, no uvular deviation    CN XI 5/5 head turn and 5/5 shoulder shrug bilaterally   CN XII midline tongue protrusion   Motor:  Muscle bulk: normal, tone normal, pronator drift none tremor none Mvmt Root Nerve  Muscle Right Left Comments  SA C5/6 Ax Deltoid     EF C5/6 Mc Biceps 5 5   EE C6/7/8 Rad Triceps 5 5   WF C6/7 Med FCR     WE C7/8 PIN ECU     F Ab C8/T1 U ADM/FDI 5 5   HF L1/2/3 Fem Illopsoas 3 3 Reports abdominal pain and thus did not assess full hip flexion strength  KE L2/3/4 Fem Quad     DF L4/5 D Peron Tib Ant 5 5   PF S1/2 Tibial Grc/Sol 5 5    Reflexes:  Right Left Comments  Pectoralis      Biceps (C5/6) 2 2   Brachioradialis (C5/6) 2 2    Triceps (C6/7) 2 2    Patellar (L3/4) 2 2    Achilles (S1)      Hoffman      Plantar     Jaw jerk    Sensation:  Light touch intact   Pin prick    Temperature    Vibration   Proprioception    Coordination/Complex Motor:  - Finger to Nose intact BL - Heel to shin unable to assess due to pain - Rapid alternating movement are normal - Gait: deferred.  Labs   CBC:  Recent Labs  Lab 09/27/20 0420 09/28/20 0334  WBC 9.5  21.9*  HGB 10.9* 11.1*  HCT 36.0* 37.3*  MCV 65.2* 66.0*  PLT 431* 477*    Basic Metabolic Panel:  Lab Results  Component Value Date   NA 133 (L) 09/28/2020   K 3.9 09/28/2020   CO2 21 (L) 09/28/2020   GLUCOSE 138 (H) 09/28/2020   BUN 7 09/28/2020   CREATININE 1.03 09/28/2020   CALCIUM 9.6 09/28/2020   GFRNONAA >60 09/28/2020   Lipid Panel:  Lab Results  Component Value Date   LDLCALC 166 (H) 09/24/2020   HgbA1c:  Lab Results  Component Value Date   HGBA1C 9.6 (H) 09/22/2020   Urine Drug Screen: No results found for: LABOPIA, COCAINSCRNUR, LABBENZ, AMPHETMU, THCU, LABBARB  Alcohol Level No results found for: ETH  Impression   Jeff Kelley is a 50 y.o. male with PMH significant for seizures on Cenobamate 100mg  daily, Lamictal 400 mg twice daily, Keppra 1500 mg twice daily but was being tapered down outpatient and phenobarbital 64.8 mg 2 times daily who is admitted with symptomatic anemia and underwent partial colectomy for a Right colon mass. He is NPO s/p surgery and neurology consulted to assist with management of AEDs.  Recommendations  - I ordered Vimpat 200mg  IV once and started Vimpat 100mg  IV BID as an adjunt given lamictal and Cenobamate is not available IV. Reviewed EJG from yesterday and PR interval is normal at 152. - I ordered Keppra 1500mg  BID(PO to IV conversion is 1:1) - continue Phenobarb IV at 65mg  BID(PO to IV conversion is 1:1) - Seizure pads - I ordered Ativan 2mg  IV once PRN for seizure lasting more than 3 mins.  ______________________________________________________________________   Thank you for the opportunity to take part in the care of this patient. If you have any further questions, please contact the neurology consultation attending.  Signed,  [place] Pager Number [phone] _ _ _   _ __   _ __ _ _  __ __   _ __   __ _

## 2020-09-29 DIAGNOSIS — R778 Other specified abnormalities of plasma proteins: Secondary | ICD-10-CM | POA: Diagnosis not present

## 2020-09-29 DIAGNOSIS — E7800 Pure hypercholesterolemia, unspecified: Secondary | ICD-10-CM | POA: Diagnosis not present

## 2020-09-29 DIAGNOSIS — D649 Anemia, unspecified: Secondary | ICD-10-CM | POA: Diagnosis not present

## 2020-09-29 DIAGNOSIS — I2510 Atherosclerotic heart disease of native coronary artery without angina pectoris: Secondary | ICD-10-CM | POA: Diagnosis not present

## 2020-09-29 LAB — CBC
HCT: 32.8 % — ABNORMAL LOW (ref 39.0–52.0)
Hemoglobin: 10 g/dL — ABNORMAL LOW (ref 13.0–17.0)
MCH: 20 pg — ABNORMAL LOW (ref 26.0–34.0)
MCHC: 30.5 g/dL (ref 30.0–36.0)
MCV: 65.6 fL — ABNORMAL LOW (ref 80.0–100.0)
Platelets: 464 10*3/uL — ABNORMAL HIGH (ref 150–400)
RBC: 5 MIL/uL (ref 4.22–5.81)
WBC: 15.3 10*3/uL — ABNORMAL HIGH (ref 4.0–10.5)
nRBC: 0 % (ref 0.0–0.2)

## 2020-09-29 LAB — BASIC METABOLIC PANEL
Anion gap: 12 (ref 5–15)
BUN: 9 mg/dL (ref 6–20)
CO2: 22 mmol/L (ref 22–32)
Calcium: 8.9 mg/dL (ref 8.9–10.3)
Chloride: 96 mmol/L — ABNORMAL LOW (ref 98–111)
Creatinine, Ser: 0.83 mg/dL (ref 0.61–1.24)
GFR, Estimated: >60 mL/min (ref >60)
Glucose, Bld: 109 mg/dL — ABNORMAL HIGH (ref 70–99)
Potassium: 3.5 mmol/L (ref 3.5–5.1)
Sodium: 130 mmol/L — ABNORMAL LOW (ref 135–145)

## 2020-09-29 LAB — GLUCOSE, CAPILLARY
Glucose-Capillary: 99 mg/dL (ref 70–99)
Glucose-Capillary: 112 mg/dL — ABNORMAL HIGH (ref 70–99)
Glucose-Capillary: 126 mg/dL — ABNORMAL HIGH (ref 70–99)
Glucose-Capillary: 91 mg/dL (ref 70–99)
Glucose-Capillary: 83 mg/dL (ref 70–99)
Glucose-Capillary: 92 mg/dL (ref 70–99)

## 2020-09-29 MED ORDER — POTASSIUM CHLORIDE 10 MEQ/100ML IV SOLN: 10.0000 meq | INTRAVENOUS | Status: DC

## 2020-09-29 MED ORDER — METOPROLOL TARTRATE 25 MG PO TABS
25.0000 mg | ORAL_TABLET | Freq: Two times a day (BID) | ORAL | Status: DC
  Administered 2020-09-29 – 2020-10-05 (×13): 25 mg via ORAL
  Filled 2020-09-29 (×13): qty 1

## 2020-09-29 MED ORDER — SODIUM CHLORIDE 0.9 % IV SOLN
510.0000 mg | Freq: Once | INTRAVENOUS | Status: AC
  Administered 2020-09-29: 510 mg via INTRAVENOUS
  Filled 2020-09-29: qty 17

## 2020-09-29 MED ORDER — POTASSIUM CHLORIDE 20 MEQ PO PACK
40.0000 meq | PACK | Freq: Every day | ORAL | Status: DC
  Administered 2020-09-29 – 2020-10-05 (×7): 40 meq via ORAL
  Filled 2020-09-29 (×7): qty 2

## 2020-09-29 NOTE — Progress Notes (Signed)
Progress Note  Patient Name: Jeff Kelley Date of Encounter: 09/29/2020  [facility] Cardiologist: Evalina Field, MD   Subjective   Admitted with SOB and found to be severely anemic with Hbg 6.1. Initial hsTrop 109>>110.  EKG abnormal on admit with ? AS MI and nonspecific ST/T wave abnormality and 2D echo with normal LVF and HK of mid to distal AS and apical anterior wall.   EGD with possible candidiasis.  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 Surgery consulted but have not seen yet.  Coronary CTA showed possible high grade lesion in LAD and distal RCA and coronary FFR now shows significant flow limiting lesions in the mid RCA and mid LAD.    S/P partial bowel resection POD#2 and doing well from cardiac standpoint.  He has not had any chest pain or SOB  Inpatient Medications    Scheduled Meds: . acetaminophen  1,000 mg Oral Q8H  . [START ON 10/01/2020] atorvastatin  80 mg Oral Daily  . Cenobamate  100 mg Oral Daily  . enoxaparin (LOVENOX) injection  40 mg Subcutaneous Q24H  . [START ON 10/01/2020] ferrous sulfate  325 mg Oral Q breakfast  . insulin aspart  0-6 Units Subcutaneous Q4H  . ketorolac  15 mg Intravenous Q8H  . lamoTRIgine  400 mg Oral BID  . LORazepam  1 mg Intravenous Q12H  . pantoprazole (PROTONIX) IV  40 mg Intravenous Q24H  . PHENObarbital  65 mg Intravenous Q12H   Continuous Infusions: . sodium chloride    . sodium chloride    . 0.9 % NaCl with KCl 20 mEq / L 75 mL/hr at 09/28/20 2242  . acetaminophen 1,000 mg (09/29/20 0606)  . ferumoxytol    . fluconazole (DIFLUCAN) IV    . lacosamide (VIMPAT) IV    . levETIRAcetam 1,500 mg (09/29/20 0015)  . methocarbamol (ROBAXIN) IV    . potassium chloride     PRN Meds: sodium chloride, fentaNYL (SUBLIMAZE) injection, LORazepam, methocarbamol (ROBAXIN) IV, mineral oil-hydrophilic petrolatum, ondansetron (ZOFRAN) IV, oxyCODONE   Vital Signs    Vitals:   09/28/20 1413  09/28/20 2112 09/29/20 0500 09/29/20 0844  BP: 108/71 138/65 140/68 131/85  Pulse: (!) 103 99 89   Resp: 16 20 20 20   Temp:  98.6 F (37 C) 98.5 F (36.9 C) 98.7 F (37.1 C)  TempSrc:  Oral Oral Oral  SpO2: 95% 96% 97% 95%  Weight:      Height:        Intake/Output Summary (Last 24 hours) at 09/29/2020 0854 Last data filed at 09/29/2020 0315 Gross per 24 hour  Intake 1619.53 ml  Output 950 ml  Net 669.53 ml   Last 3 Weights 09/27/2020 09/26/2020 09/23/2020  Weight (lbs) 192 lb 10.9 oz 192 lb 10.9 oz 191 lb 8 oz  Weight (kg) 87.4 kg 87.4 kg 86.864 kg      Telemetry    NSR- Personally Reviewed  ECG    No new EKG to review - Personally Reviewed  Physical Exam   GEN: Well nourished, well developed in no acute distress HEENT: Normal NECK: No JVD; No carotid bruits LYMPHATICS: No lymphadenopathy CARDIAC:RRR, no murmurs, rubs, gallops RESPIRATORY:  Clear to auscultation without rales, wheezing or rhonchi  ABDOMEN: Soft, non-tender, non-distended MUSCULOSKELETAL:  No edema; No deformity  SKIN: Warm and dry NEUROLOGIC:  Alert and oriented x 3 PSYCHIATRIC:  Normal affect    Labs    High Sensitivity Troponin:  Recent Labs  Lab 09/21/20 2051 09/21/20 2245  TROPONINIHS 109* 110*      Chemistry Recent Labs  Lab 09/25/20 0228 09/25/20 1647 09/27/20 0420 09/28/20 0334 09/29/20 0152  NA 135   < > 135 133* 130*  K 3.3*   < > 3.7 3.9 3.5  CL 103   < > 99 96* 96*  CO2 24   < > 25 21* 22  GLUCOSE 117*   < > 143* 138* 109*  BUN 7   < > <5* 7 9  CREATININE 0.70   < > 0.85 1.03 0.83  CALCIUM 8.8*   < > 9.5 9.6 8.9  PROT 6.2*  --   --   --   --   ALBUMIN 2.9*  --   --   --   --   AST 13*  --   --   --   --   ALT 12  --   --   --   --   ALKPHOS 70  --   --   --   --   BILITOT 0.6  --   --   --   --   GFRNONAA >60   < > >60 >60 >60  ANIONGAP 8   < > 11 16* 12   < > = values in this interval not displayed.     Hematology Recent Labs  Lab 09/27/20 0420  09/28/20 0334 09/29/20 0152  WBC 9.5 21.9* 15.3*  RBC 5.52 5.65 5.00  HGB 10.9* 11.1* 10.0*  HCT 36.0* 37.3* 32.8*  MCV 65.2* 66.0* 65.6*  MCH 19.7* 19.6* 20.0*  MCHC 30.3 29.8* 30.5  RDW 30.5* Not Measured Not Measured  PLT 431* 477* 464*    BNP No results for input(s): BNP, PROBNP in the last 168 hours.   DDimer No results for input(s): DDIMER in the last 168 hours.   Radiology    No results found.  Cardiac Studies   2D echo 09/22/2020 IMPRESSIONS   1. Left ventricular ejection fraction by 3D volume is 56 %. The left  ventricle has normal function. The left ventricle demonstrates regional  wall motion abnormalities (see scoring diagram/findings for description).  Left ventricular diastolic parameters  are consistent with Grade II diastolic dysfunction (pseudonormalization).  2. Right ventricular systolic function is normal. The right ventricular  size is normal. Tricuspid regurgitation signal is inadequate for assessing  PA pressure.  3. The mitral valve is normal in structure. Trivial mitral valve  regurgitation. No evidence of mitral stenosis.  4. The aortic valve is grossly normal. Aortic valve regurgitation is not  visualized. No aortic stenosis is present.  5. Aortic dilatation noted. There is mild dilatation of the aortic root,  measuring 40 mm.  6. The inferior vena cava is normal in size with greater than 50%  respiratory variability, suggesting right atrial pressure of 3 mmHg.   Patient Profile     50 y.o. male with a hx of seizure disorder, diabetes (found this admission), obesity who is being seen  for the evaluation of abnormal echocardiogram at the request of Irene Pap, MD.  Assessment & Plan    Abnormal EKG/Demand Ischemia -admitted with presumed GI bleed and severe anemia (Hbg 6.1) -Hemoccult is positive.  -Troponins were elevated but flat.   -His EKG demonstrates nonspecific ST-T changes. -echo demonstrates normal LV function with  hypokinesis of the mid to apical anterior segment and apical septum.   -He has not had any CP -shortness of  breath likely related to his anemia.  -he does have CVD risk factors including new onset diabetes which is just been found. -Overall, this does not represent an acute coronary syndrome.  This represents demand ischemia in the setting of severe anemia.   -Coronary CTA showed possible high grade obstructive lesion in the LAD and dRCA.   -very difficult situation>>coronary FFR showed high grade flow limiting lesions in the RCA and mild LAD.  This is a high risk scan.  I have discussed case with interventional colleagues.  Given his GI bleed with Hbg as low at 6.1, he is not a candidate for DAPT with PCI until colon resection complete. He would need DAPT for at least a month prior to any surgery which is not an option in this case given large colon mass and severe anemia.  He has not had any chest pain despite significant CAD in setting of severe anemia.  Would recommend proceeding with surgery. -once cleared from a surgical standpoint for DAPT therapy, will plan cath and PCI. This can be done in outpt setting if he remains stable postop -he has been started on high dose statin and LDL was 166 this admit -he has not had any anginal symptoms post op -add Lopressor 25mg  BID and continue statin -start ASA 81mg  daily when ok with surgery  Severe Anemia/Colonic mass -EGD showed esophageal candidiasis -colonoscopy with large fungating ascending colonic mass -surgery consulted -CEA elevated at 12.4 -Hbg 6.1 and up to 10  today -s/p colon resection POD#1  HLD -he has aortic atherosclerosis and small ascending aortic aneurysm on abdominal CTA -LDL goal < 70 -LDL was 166 and started on high dose statin -will need FLP and ALT in 6 weeks      For questions or updates, please contact [facility] Please consult [email or web address] for contact info under        Signed, Fransico Him, MD   09/29/2020, 8:54 AM

## 2020-09-29 NOTE — Progress Notes (Signed)
2 Days Post-Op    CC:  Subjective: Pain well controlled. Notes some flatus..  Objective: Vital signs in last 24 hours: Temp:  [98.5 F (36.9 C)-98.7 F (37.1 C)] 98.7 F (37.1 C) (03/19 0844) Pulse Rate:  [89-103] 89 (03/19 0500) Resp:  [16-20] 20 (03/19 0844) BP: (108-140)/(65-85) 131/85 (03/19 0844) SpO2:  [95 %-97 %] 95 % (03/19 0844) Last BM Date: 09/25/20   Intake/Output from previous day: 03/18 0701 - 03/19 0700 In: 1619.5 [P.O.:30; I.V.:1244.5; IV Piggyback:345] Out: 950 [Urine:300; Emesis/NG output:650] Intake/Output this shift: No intake/output data recorded.  General appearance: alert, cooperative, no distress and wants NG out Resp: clear to auscultation bilaterally GI: soft, mildly distended, appropriately tender. Incision c/d/i with honeycomb  Lab Results:  Recent Labs    09/28/20 0334 09/29/20 0152  WBC 21.9* 15.3*  HGB 11.1* 10.0*  HCT 37.3* 32.8*  PLT 477* 464*    BMET Recent Labs    09/28/20 0334 09/29/20 0152  NA 133* 130*  K 3.9 3.5  CL 96* 96*  CO2 21* 22  GLUCOSE 138* 109*  BUN 7 9  CREATININE 1.03 0.83  CALCIUM 9.6 8.9   PT/INR No results for input(s): LABPROT, INR in the last 72 hours.  Recent Labs  Lab 09/25/20 0228  AST 13*  ALT 12  ALKPHOS 70  BILITOT 0.6  PROT 6.2*  ALBUMIN 2.9*     Lipase  No results found for: LIPASE   Medications: . acetaminophen  1,000 mg Oral Q8H  . [START ON 10/01/2020] atorvastatin  80 mg Oral Daily  . Cenobamate  100 mg Oral Daily  . enoxaparin (LOVENOX) injection  40 mg Subcutaneous Q24H  . [START ON 10/01/2020] ferrous sulfate  325 mg Oral Q breakfast  . insulin aspart  0-6 Units Subcutaneous Q4H  . ketorolac  15 mg Intravenous Q8H  . lamoTRIgine  400 mg Oral BID  . LORazepam  1 mg Intravenous Q12H  . metoprolol tartrate  25 mg Oral BID  . pantoprazole (PROTONIX) IV  40 mg Intravenous Q24H  . PHENObarbital  65 mg Intravenous Q12H   . sodium chloride    . sodium chloride    .  0.9 % NaCl with KCl 20 mEq / L 75 mL/hr at 09/28/20 2242  . acetaminophen 1,000 mg (09/29/20 0606)  . ferumoxytol    . fluconazole (DIFLUCAN) IV    . lacosamide (VIMPAT) IV    . levETIRAcetam 1,500 mg (09/29/20 0015)  . methocarbamol (ROBAXIN) IV    . potassium chloride      Assessment/Plan Hx of Seizures 4.4 cm ascending thoracic aortic aneurysm VP shunt in place HLD DM - Hbg A1c 9.6 Elevated Tnon admission - Coronary CTA showed possible high grade lesion in LAD and distal RCA and coronary FFR now shows significant flow limiting lesions in the mid RCA and mid LAD. Per cardiology he is not a candidate for DAPT with PCIuntil colon resection complete. Would recommend proceeding with surgery. Need to avoid hypotension. Once cleared from a surgical standpoint for DAPT therapy, will plan cath and PCI. This can be done in outpt setting if he is stable postop CAD Severe atherosclerosis Esophageal plaques- noted on endoscopy, brushing taken by Dr. Collene Mares ABL anemia- likely 2/2 GI loss. hgb 6.1 on admission,10.9>>11.1  - transfused 3/11 2 units; 3/12 - 1 unit; 3/15 - 2 units Moderate malnutrition - Prealbumin 16.2  Colon Mass - Endoscopy showed a large malignant tumor in the proximal ascending colon close to the  ICVthat wasbiopsied.This also showed a6 mm sessile polyp in the sigmoid colonand a10 mm sessile polyp at the hepatic flexure that were retrieved with snare. - Pathology: polyps benign, right colon mass adenocarcinoma - CT CAPordered and showed5.0 x 1.4 x 3.7 cm eccentric intraluminal and transmural proximal colonic mass highly concerning for colon cancer, no evidence of obstruction; multiplesubcentimeter lymph nodes within the right lower quadrant mesentery - CEA12.4 Laparoscopic assisted partial colectomy 09/27/20, Dr. Autumn Messing III POD#2  - WBC 9.5>> 21.9>>15.3 FEN -NPO/ no IV fluids listed VTE -SCDs ID -diflucan 3/14>>  Plan: NG clamping trials. Await return  of bowel function. Mobilize as able        LOS: 8 days    Jeff Kelley 09/29/2020 Please see Amion

## 2020-09-29 NOTE — Progress Notes (Signed)
PROGRESS NOTE    Jeff Kelley  FOY:[id number] DOB: [birth year] DOA: 09/21/2020 PCP: Patient, No Pcp Per   Brief Narrative:  50 y.o.malewith medical history significant forseizure disorder and anxiety, now presenting to the emergency department for evaluation of shortness of breath. Patient began to notice shortness of breath with exertion approximately 1 month ago, slowly progressed to the point where he was dyspneic at rest.  Upon admission patient was noted to be anemic with hemoglobin of 6.1 requiring transfusion.  Underwent EGD/colonoscopy in 09/24/2020. Discussed with GI Dr. Collene Mares findings concerning for esophageal candidiasis and a large mass in the colon.    Elevated CEA.    Chest abdomen pelvis done for staging..  Started gentle IV fluid hydration to prevent contrast-induced nephropathy.    Cardiology, GI and general surgery has been consulted.  Underwent partial colectomy 3/17.  Awaiting bowel recovery  Assessment & Plan:   Principal Problem:   Symptomatic anemia Active Problems:   Seizures (HCC)   Occult GI bleeding   Elevated troponin   CHF (congestive heart failure) (HCC)   Hyperglycemia   Iron deficiency anemia   Hyponatremia   Abnormal echocardiogram   New diagnosis of adenocarcinoma: Status post partial colectomy 3/17 Symptomatic iron deficiency anemia -Initially had dyspnea on exertion and shortness of breath from anemia. -Endoscopy showed esophageal Candida therefore started on Diflucan 100 mg for 7 days -Colonoscopy showed multiple sessile polyp which was removed and a large malignant tumor status post biopsy- Adenocarcinoma -CEA levels elevated -CT chest abdomen pelvis-intraluminal and transmural proximal colonic mass seen, multiple subcentimeter lymph nodes, some groundglass opacities. -Status post partial colectomy 3/17 -Dr. Learta Codding has arranged for outpatient follow-up appointment in 3 weeks -Currently n.p.o.  Hypertension surgery.  Esophageal Candida   Diflucan 100 mg daily x7 days.  Last day 3/20  Newly diagnosed ascending aortic aneurysm measuring 4.4 cm Patient made aware of this finding and of recommendations.  ACCF/AHA guidelines. Recommended annual imaging follow-up by CTA or MRA.  Severe symptomatic iron deficiency anemia with positive FOBT likely secondary to chronic blood loss in the setting of large malignant colon mass. Secondary to iron deficiency.  Initially received PRBC transfusion.  Hemoglobin stable.  Monitor postoperatively.  Currently n.p.o. therefore we will give him IV iron  Elevated troponin likely secondary to demand ischemia in the setting of severe anemia Diastolic CHF/mid and distal anterior septum, apical anterior segments are hypokinetic. Overall echocardiogram is unremarkable.  CT coronary showed elevated calcium scores.  High FFR.  Eventually will require cardiac catheterization.  Will defer timing to cardiology. Continue Lipitor 2D echo showing LVEF 56% left ventricle demonstrate regional wall motion abnormalities, left ventricle diastolic parameters are consistent with grade 2 diastolic dysfunction. Cardiology following.   Type 2 diabetes with hyperglycemia Hemoglobin A1c 9.6 on 09/30/2020 Continue insulin sliding scale. Continue to monitor CBGs  Seizure disorder Keppra and phenobarbital been converted to IV.  Patient is on PO Cenobamate and Lamictal PO but no IV form therefore neurology consulted who was started IV Vimpat.  IV Ativan as needed  Chronic anxiety Continue home regimen.    DVT prophylaxis: enoxaparin (LOVENOX) injection 40 mg Start: 09/28/20 1600 Place and maintain sequential compression device Start: 09/28/20 0929 SCD's Start: 09/27/20 1826 SCDs Start: 09/22/20 0020  Code Status: Full code Family Communication:    Status is: Inpatient  Remains inpatient appropriate because:Inpatient level of care appropriate due to severity of illness   Dispo:  Patient From:  Home  Planned Disposition: Home  Medically stable  for discharge: No.  Currently postop, routine recovery.  Management per general surgery.      Body mass index is 31.11 kg/m.       Subjective: Patient states his abdominal soreness is slightly better.  Has not had anything oral to drink.  Minimal passage of gas.  Review of Systems Otherwise negative except as per HPI, including: General: Denies fever, chills, night sweats or unintended weight loss. Resp: Denies cough, wheezing, shortness of breath. Cardiac: Denies chest pain, palpitations, orthopnea, paroxysmal nocturnal dyspnea. GI: Denies nausea, vomiting, diarrhea or constipation GU: Denies dysuria, frequency, hesitancy or incontinence MS: Denies muscle aches, joint pain or swelling Neuro: Denies headache, neurologic deficits (focal weakness, numbness, tingling), abnormal gait Psych: Denies anxiety, depression, SI/HI/AVH Skin: Denies new rashes or lesions ID: Denies sick contacts, exotic exposures, travel  Examination: Constitutional: Not in acute distress Respiratory: Clear to auscultation bilaterally Cardiovascular: Normal sinus rhythm, no rubs Abdomen: Tenderness as expected mildly distended good bowel sounds Musculoskeletal: No edema noted Skin: N abdominal incision and surgical site noted. Neurologic: CN 2-12 grossly intact.  And nonfocal Psychiatric: Normal judgment and insight. Alert and oriented x 3. Normal mood.  Objective: Vitals:   09/28/20 1413 09/28/20 2112 09/29/20 0500 09/29/20 0844  BP: 108/71 138/65 140/68 131/85  Pulse: (!) 103 99 89   Resp: 16 20 20 20   Temp:  98.6 F (37 C) 98.5 F (36.9 C) 98.7 F (37.1 C)  TempSrc:  Oral Oral Oral  SpO2: 95% 96% 97% 95%  Weight:      Height:        Intake/Output Summary (Last 24 hours) at 09/29/2020 0845 Last data filed at 09/29/2020 0315 Gross per 24 hour  Intake 1619.53 ml  Output 950 ml  Net 669.53 ml   Filed Weights   09/23/20 0830 09/26/20 0500  09/27/20 1241  Weight: 86.9 kg 87.4 kg 87.4 kg     Data Reviewed:   CBC: Recent Labs  Lab 09/25/20 0228 09/26/20 0555 09/27/20 0420 09/28/20 0334 09/29/20 0152  WBC 9.3 10.0 9.5 21.9* 15.3*  HGB 7.5* 10.6* 10.9* 11.1* 10.0*  HCT 26.9* 36.4* 36.0* 37.3* 32.8*  MCV 62.1* 66.3* 65.2* 66.0* 65.6*  PLT 428* 411* 431* 477* 676*   Basic Metabolic Panel: Recent Labs  Lab 09/25/20 0228 09/25/20 1647 09/26/20 0555 09/27/20 0420 09/28/20 0334 09/29/20 0152  NA 135 132* 138 135 133* 130*  K 3.3* 3.8 3.8 3.7 3.9 3.5  CL 103 100 103 99 96* 96*  CO2 24 22 26 25  21* 22  GLUCOSE 117* 126* 120* 143* 138* 109*  BUN 7 5* <5* <5* 7 9  CREATININE 0.70 0.71 0.78 0.85 1.03 0.83  CALCIUM 8.8* 8.9 9.2 9.5 9.6 8.9  MG 2.0 1.9 1.9  --   --   --   PHOS  --  3.4  --   --   --   --    GFR: Estimated Creatinine Clearance: 110.2 mL/min (by C-G formula based on SCr of 0.83 mg/dL). Liver Function Tests: Recent Labs  Lab 09/25/20 0228  AST 13*  ALT 12  ALKPHOS 70  BILITOT 0.6  PROT 6.2*  ALBUMIN 2.9*   No results for input(s): LIPASE, AMYLASE in the last 168 hours. No results for input(s): AMMONIA in the last 168 hours. Coagulation Profile: Recent Labs  Lab 09/26/20 0555  INR 1.1   Cardiac Enzymes: No results for input(s): CKTOTAL, CKMB, CKMBINDEX, TROPONINI in the last 168 hours. BNP (last 3 results) No results  for input(s): PROBNP in the last 8760 hours. HbA1C: No results for input(s): HGBA1C in the last 72 hours. CBG: Recent Labs  Lab 09/28/20 1623 09/28/20 1919 09/28/20 2257 09/29/20 0314 09/29/20 0718  GLUCAP 144* 111* 106* 99 112*   Lipid Profile: No results for input(s): CHOL, HDL, LDLCALC, TRIG, CHOLHDL, LDLDIRECT in the last 72 hours. Thyroid Function Tests: No results for input(s): TSH, T4TOTAL, FREET4, T3FREE, THYROIDAB in the last 72 hours. Anemia Panel: No results for input(s): VITAMINB12, FOLATE, FERRITIN, TIBC, IRON, RETICCTPCT in the last 72  hours. Sepsis Labs: No results for input(s): PROCALCITON, LATICACIDVEN in the last 168 hours.  Recent Results (from the past 240 hour(s))  SARS CORONAVIRUS 2 (TAT 6-24 HRS) Nasopharyngeal Nasopharyngeal Swab     Status: None   Collection Time: 09/21/20  8:45 PM   Specimen: Nasopharyngeal Swab  Result Value Ref Range Status   SARS Coronavirus 2 NEGATIVE NEGATIVE Final    Comment: (NOTE) SARS-CoV-2 target nucleic acids are NOT DETECTED.  The SARS-CoV-2 RNA is generally detectable in upper and lower respiratory specimens during the acute phase of infection. Negative results do not preclude SARS-CoV-2 infection, do not rule out co-infections with other pathogens, and should not be used as the sole basis for treatment or other patient management decisions. Negative results must be combined with clinical observations, patient history, and epidemiological information. The expected result is Negative.  Fact Sheet for Patients: SugarRoll.be  Fact Sheet for Healthcare Providers: https://www.woods-mathews.com/  This test is not yet approved or cleared by the [place] FDA and  has been authorized for detection and/or diagnosis of SARS-CoV-2 by FDA under an Emergency Use Authorization (EUA). This EUA will remain  in effect (meaning this test can be used) for the duration of the COVID-19 declaration under Se ction 564(b)(1) of the Act, 21 U.S.C. section 360bbb-3(b)(1), unless the authorization is terminated or revoked sooner.  Performed at [facility], [place] [address] [place], [place] [address]   MRSA PCR Screening     Status: None   Collection Time: 09/22/20  4:25 AM   Specimen: Nasopharyngeal  Result Value Ref Range Status   MRSA by PCR NEGATIVE NEGATIVE Final    Comment:        The GeneXpert MRSA Assay (FDA approved for NASAL specimens only), is one component of a comprehensive MRSA colonization surveillance program. It is  not intended to diagnose MRSA infection nor to guide or monitor treatment for MRSA infections. Performed at [facility], [place] [address], [place], [place] [address]          Radiology Studies: No results found.      Scheduled Meds: . acetaminophen  1,000 mg Oral Q8H  . [START ON 10/01/2020] atorvastatin  80 mg Oral Daily  . Cenobamate  100 mg Oral Daily  . enoxaparin (LOVENOX) injection  40 mg Subcutaneous Q24H  . [START ON 10/01/2020] ferrous sulfate  325 mg Oral Q breakfast  . insulin aspart  0-6 Units Subcutaneous Q4H  . ketorolac  15 mg Intravenous Q8H  . lamoTRIgine  400 mg Oral BID  . LORazepam  1 mg Intravenous Q12H  . pantoprazole (PROTONIX) IV  40 mg Intravenous Q24H  . PHENObarbital  65 mg Intravenous Q12H   Continuous Infusions: . sodium chloride    . sodium chloride    . 0.9 % NaCl with KCl 20 mEq / L 75 mL/hr at 09/28/20 2242  . acetaminophen 1,000 mg (09/29/20 0606)  . fluconazole (DIFLUCAN)  IV    . lacosamide (VIMPAT) IV    . levETIRAcetam 1,500 mg (09/29/20 0015)  . methocarbamol (ROBAXIN) IV    . potassium chloride       LOS: 8 days   Time spent= 35 mins    Argus Caraher Arsenio Loader, MD [facility]  If 7PM-7AM, please contact night-coverage  09/29/2020, 8:45 AM

## 2020-09-30 DIAGNOSIS — D649 Anemia, unspecified: Secondary | ICD-10-CM | POA: Diagnosis not present

## 2020-09-30 LAB — GLUCOSE, CAPILLARY
Glucose-Capillary: 91 mg/dL (ref 70–99)
Glucose-Capillary: 95 mg/dL (ref 70–99)
Glucose-Capillary: 164 mg/dL — ABNORMAL HIGH (ref 70–99)
Glucose-Capillary: 76 mg/dL (ref 70–99)
Glucose-Capillary: 99 mg/dL (ref 70–99)

## 2020-09-30 LAB — CBC
HCT: 32 % — ABNORMAL LOW (ref 39.0–52.0)
Hemoglobin: 9.6 g/dL — ABNORMAL LOW (ref 13.0–17.0)
MCH: 20 pg — ABNORMAL LOW (ref 26.0–34.0)
MCHC: 30 g/dL (ref 30.0–36.0)
MCV: 66.5 fL — ABNORMAL LOW (ref 80.0–100.0)
Platelets: 470 10*3/uL — ABNORMAL HIGH (ref 150–400)
RBC: 4.81 MIL/uL (ref 4.22–5.81)
WBC: 12.1 10*3/uL — ABNORMAL HIGH (ref 4.0–10.5)
nRBC: 0 % (ref 0.0–0.2)

## 2020-09-30 LAB — BASIC METABOLIC PANEL
Anion gap: 14 (ref 5–15)
BUN: 8 mg/dL (ref 6–20)
CO2: 18 mmol/L — ABNORMAL LOW (ref 22–32)
Calcium: 8.5 mg/dL — ABNORMAL LOW (ref 8.9–10.3)
Chloride: 100 mmol/L (ref 98–111)
Creatinine, Ser: 0.76 mg/dL (ref 0.61–1.24)
GFR, Estimated: >60 mL/min (ref >60)
Glucose, Bld: 95 mg/dL (ref 70–99)
Potassium: 3.9 mmol/L (ref 3.5–5.1)
Sodium: 132 mmol/L — ABNORMAL LOW (ref 135–145)

## 2020-09-30 LAB — MAGNESIUM: Magnesium: 1.9 mg/dL (ref 1.7–2.4)

## 2020-09-30 MED ORDER — BOOST / RESOURCE BREEZE PO LIQD CUSTOM
1.0000 | Freq: Three times a day (TID) | ORAL | Status: DC
  Administered 2020-09-30 – 2020-10-03 (×9): 1 via ORAL

## 2020-09-30 NOTE — Progress Notes (Signed)
PROGRESS NOTE    Jeff Kelley  JYN:[id number] DOB: 1970-07-20 DOA: 09/21/2020 PCP: Patient, No Pcp Per   Brief Narrative:  50 y.o.malewith medical history significant forseizure disorder and anxiety, now presenting to the emergency department for evaluation of shortness of breath. Patient began to notice shortness of breath with exertion approximately 1 month ago, slowly progressed to the point where he was dyspneic at rest.  Upon admission patient was noted to be anemic with hemoglobin of 6.1 requiring transfusion.  Underwent EGD/colonoscopy in 09/24/2020. Discussed with GI Dr. Collene Mares findings concerning for esophageal candidiasis and a large mass in the colon.    Elevated CEA.    Chest abdomen pelvis done for staging..  Started gentle IV fluid hydration to prevent contrast-induced nephropathy.    Cardiology, GI and general surgery has been consulted.  Underwent partial colectomy 3/17.  Awaiting bowel recovery  Assessment & Plan:   Principal Problem:   Symptomatic anemia Active Problems:   Seizures (HCC)   Occult GI bleeding   Elevated troponin   CHF (congestive heart failure) (HCC)   Hyperglycemia   Iron deficiency anemia   Hyponatremia   Abnormal echocardiogram   New diagnosis of adenocarcinoma: Status post partial colectomy 3/17 Symptomatic iron deficiency anemia -Initially had dyspnea on exertion and shortness of breath from anemia. -Endoscopy showed esophageal Candida therefore started on Diflucan 100 mg for 7 days -Colonoscopy showed multiple sessile polyp which was removed and a large malignant tumor status post biopsy- Adenocarcinoma -CEA levels elevated -CT chest abdomen pelvis-intraluminal and transmural proximal colonic mass seen, multiple subcentimeter lymph nodes, some groundglass opacities. -Status post partial colectomy 3/17 -Dr. Learta Codding has arranged for outpatient follow-up appointment in 3 weeks -Diet per Gen Surgery.   Esophageal Candida  Diflucan 100 mg  daily x7 days.  Last day 3/20  Newly diagnosed ascending aortic aneurysm measuring 4.4 cm Patient made aware of this finding and of recommendations.  ACCF/AHA guidelines. Recommended annual imaging follow-up by CTA or MRA.  Severe symptomatic iron deficiency anemia with positive FOBT likely secondary to chronic blood loss in the setting of large malignant colon mass. Secondary to iron deficiency.  Initially received PRBC transfusion.  Hemoglobin stable.  Monitor postoperatively.  Resume Po Iron   Elevated troponin likely secondary to demand ischemia in the setting of severe anemia Diastolic CHF/mid and distal anterior septum, apical anterior segments are hypokinetic. Overall echocardiogram is unremarkable.  CT coronary showed elevated calcium scores.  High FFR.  Eventually will require cardiac catheterization.  Will defer timing to cardiology. Continue Lipitor 2D echo showing LVEF 56% left ventricle demonstrate regional wall motion abnormalities, left ventricle diastolic parameters are consistent with grade 2 diastolic dysfunction. Cardiology following.   Type 2 diabetes with hyperglycemia Hemoglobin A1c 9.6 on 09/30/2020 Continue insulin sliding scale. Continue to monitor CBGs  Seizure disorder Keppra and phenobarbital been converted to IV.  Patient is on PO Cenobamate and Lamictal PO but no IV form therefore neurology consulted who was started IV Vimpat.  IV Ativan as needed. Hopefully we can slowly get him back on his home meds, once tolerating po.   Chronic anxiety Continue home regimen.    DVT prophylaxis: enoxaparin (LOVENOX) injection 40 mg Start: 09/28/20 1600 Place and maintain sequential compression device Start: 09/28/20 0929 SCD's Start: 09/27/20 1826 SCDs Start: 09/22/20 0020  Code Status: Full code Family Communication:    Status is: Inpatient  Remains inpatient appropriate because:Inpatient level of care appropriate due to severity of  illness   Dispo:  Patient From: Home  Planned Disposition: Home  Medically stable for discharge: No.  Currently postop, routine recovery.  Management per general surgery.      Body mass index is 31.11 kg/m.       Subjective: Passing gas. Pain is better.   Review of Systems Otherwise negative except as per HPI, including: General = no fevers, chills, dizziness,  fatigue HEENT/EYES = negative for loss of vision, double vision, blurred vision,  sore throa Cardiovascular= negative for chest pain, palpitation Respiratory/lungs= negative for shortness of breath, cough, wheezing; hemoptysis,  Gastrointestinal= negative for nausea, vomiting, abdominal pain Genitourinary= negative for Dysuria MSK = Negative for arthralgia, myalgias Neurology= Negative for headache, numbness, tingling  Psychiatry= Negative for suicidal and homocidal ideation Skin= Negative for Rash   Examination: Constitutional: Not in acute distress; NGT in place - clamped.  Respiratory: Clear to auscultation bilaterally Cardiovascular: Normal sinus rhythm, no rubs Abdomen: Nontender nondistended good bowel sounds Musculoskeletal: No edema noted Skin: surgical incision noted- no issues.  Neurologic: CN 2-12 grossly intact.  And nonfocal Psychiatric: Normal judgment and insight. Alert and oriented x 3. Normal mood.    Objective: Vitals:   09/29/20 0500 09/29/20 0844 09/29/20 2035 09/30/20 0338  BP: 140/68 131/85 (!) 151/96 (!) 147/86  Pulse: 89  (!) 101 86  Resp: 20 20 18 20   Temp: 98.5 F (36.9 C) 98.7 F (37.1 C) 98 F (36.7 C) 97.9 F (36.6 C)  TempSrc: Oral Oral    SpO2: 97% 95% 97% 94%  Weight:      Height:        Intake/Output Summary (Last 24 hours) at 09/30/2020 1151 Last data filed at 09/30/2020 0348 Gross per 24 hour  Intake 2123.67 ml  Output 1400 ml  Net 723.67 ml   Filed Weights   09/23/20 0830 09/26/20 0500 09/27/20 1241  Weight: 86.9 kg 87.4 kg 87.4 kg     Data Reviewed:    CBC: Recent Labs  Lab 09/26/20 0555 09/27/20 0420 09/28/20 0334 09/29/20 0152 09/30/20 0132  WBC 10.0 9.5 21.9* 15.3* 12.1*  HGB 10.6* 10.9* 11.1* 10.0* 9.6*  HCT 36.4* 36.0* 37.3* 32.8* 32.0*  MCV 66.3* 65.2* 66.0* 65.6* 66.5*  PLT 411* 431* 477* 464* 188*   Basic Metabolic Panel: Recent Labs  Lab 09/25/20 0228 09/25/20 1647 09/26/20 0555 09/27/20 0420 09/28/20 0334 09/29/20 0152 09/30/20 0132  NA 135 132* 138 135 133* 130* 132*  K 3.3* 3.8 3.8 3.7 3.9 3.5 3.9  CL 103 100 103 99 96* 96* 100  CO2 24 22 26 25  21* 22 18*  GLUCOSE 117* 126* 120* 143* 138* 109* 95  BUN 7 5* <5* <5* 7 9 8   CREATININE 0.70 0.71 0.78 0.85 1.03 0.83 0.76  CALCIUM 8.8* 8.9 9.2 9.5 9.6 8.9 8.5*  MG 2.0 1.9 1.9  --   --   --  1.9  PHOS  --  3.4  --   --   --   --   --    GFR: Estimated Creatinine Clearance: 114.4 mL/min (by C-G formula based on SCr of 0.76 mg/dL). Liver Function Tests: Recent Labs  Lab 09/25/20 0228  AST 13*  ALT 12  ALKPHOS 70  BILITOT 0.6  PROT 6.2*  ALBUMIN 2.9*   No results for input(s): LIPASE, AMYLASE in the last 168 hours. No results for input(s): AMMONIA in the last 168 hours. Coagulation Profile: Recent Labs  Lab 09/26/20 0555  INR 1.1   Cardiac Enzymes: No results for input(s):  CKTOTAL, CKMB, CKMBINDEX, TROPONINI in the last 168 hours. BNP (last 3 results) No results for input(s): PROBNP in the last 8760 hours. HbA1C: No results for input(s): HGBA1C in the last 72 hours. CBG: Recent Labs  Lab 09/29/20 1729 09/29/20 2036 09/29/20 2324 09/30/20 0335 09/30/20 0819  GLUCAP 91 83 92 91 95   Lipid Profile: No results for input(s): CHOL, HDL, LDLCALC, TRIG, CHOLHDL, LDLDIRECT in the last 72 hours. Thyroid Function Tests: No results for input(s): TSH, T4TOTAL, FREET4, T3FREE, THYROIDAB in the last 72 hours. Anemia Panel: No results for input(s): VITAMINB12, FOLATE, FERRITIN, TIBC, IRON, RETICCTPCT in the last 72 hours. Sepsis Labs: No results  for input(s): PROCALCITON, LATICACIDVEN in the last 168 hours.  Recent Results (from the past 240 hour(s))  SARS CORONAVIRUS 2 (TAT 6-24 HRS) Nasopharyngeal Nasopharyngeal Swab     Status: None   Collection Time: 09/21/20  8:45 PM   Specimen: Nasopharyngeal Swab  Result Value Ref Range Status   SARS Coronavirus 2 NEGATIVE NEGATIVE Final    Comment: (NOTE) SARS-CoV-2 target nucleic acids are NOT DETECTED.  The SARS-CoV-2 RNA is generally detectable in upper and lower respiratory specimens during the acute phase of infection. Negative results do not preclude SARS-CoV-2 infection, do not rule out co-infections with other pathogens, and should not be used as the sole basis for treatment or other patient management decisions. Negative results must be combined with clinical observations, patient history, and epidemiological information. The expected result is Negative.  Fact Sheet for Patients: SugarRoll.be  Fact Sheet for Healthcare Providers: https://www.woods-mathews.com/  This test is not yet approved or cleared by the [place] FDA and  has been authorized for detection and/or diagnosis of SARS-CoV-2 by FDA under an Emergency Use Authorization (EUA). This EUA will remain  in effect (meaning this test can be used) for the duration of the COVID-19 declaration under Se ction 564(b)(1) of the Act, 21 U.S.C. section 360bbb-3(b)(1), unless the authorization is terminated or revoked sooner.  Performed at [facility], [place] [address], [place], [place] [address]   MRSA PCR Screening     Status: None   Collection Time: 09/22/20  4:25 AM   Specimen: Nasopharyngeal  Result Value Ref Range Status   MRSA by PCR NEGATIVE NEGATIVE Final    Comment:        The GeneXpert MRSA Assay (FDA approved for NASAL specimens only), is one component of a comprehensive MRSA colonization surveillance program. It is not intended to diagnose  MRSA infection nor to guide or monitor treatment for MRSA infections. Performed at [facility], [place] [address], [place], [place] [address]          Radiology Studies: No results found.      Scheduled Meds: . acetaminophen  1,000 mg Oral Q8H  . [START ON 10/01/2020] atorvastatin  80 mg Oral Daily  . Cenobamate  100 mg Oral Daily  . enoxaparin (LOVENOX) injection  40 mg Subcutaneous Q24H  . feeding supplement  1 Container Oral TID BM  . [START ON 10/01/2020] ferrous sulfate  325 mg Oral Q breakfast  . insulin aspart  0-6 Units Subcutaneous Q4H  . ketorolac  15 mg Intravenous Q8H  . lamoTRIgine  400 mg Oral BID  . LORazepam  1 mg Intravenous Q12H  . metoprolol tartrate  25 mg Oral BID  . pantoprazole (PROTONIX) IV  40 mg Intravenous Q24H  . PHENObarbital  65 mg Intravenous Q12H  . potassium chloride  40 mEq Oral  Daily   Continuous Infusions: . sodium chloride    . sodium chloride    . 0.9 % NaCl with KCl 20 mEq / L 75 mL/hr at 09/30/20 0214  . fluconazole (DIFLUCAN) IV 100 mg (09/30/20 0847)  . lacosamide (VIMPAT) IV 100 mg (09/30/20 1017)  . levETIRAcetam 1,500 mg (09/30/20 0004)  . methocarbamol (ROBAXIN) IV       LOS: 9 days   Time spent= 35 mins    Amilee Janvier Arsenio Loader, MD [facility]  If 7PM-7AM, please contact night-coverage  09/30/2020, 11:51 AM

## 2020-09-30 NOTE — Progress Notes (Signed)
3 Days Post-Op    CC:  Subjective: Pain is well controlled.  Ongoing flatus.  Tolerated clamping trials yesterday without nausea or bloating.  Output overnight from the NG tube about 100 cc   Objective: Vital signs in last 24 hours: Temp:  [97.9 F (36.6 C)-98 F (36.7 C)] 97.9 F (36.6 C) (03/20 0338) Pulse Rate:  [86-101] 86 (03/20 0338) Resp:  [18-20] 20 (03/20 0338) BP: (147-151)/(86-96) 147/86 (03/20 0338) SpO2:  [94 %-97 %] 94 % (03/20 0338) Last BM Date: 09/25/20   Intake/Output from previous day: 03/19 0701 - 03/20 0700 In: 2363.7 [P.O.:240; I.V.:1803.7; IV Piggyback:320] Out: 1800 [Urine:1000; Emesis/NG output:800] Intake/Output this shift: No intake/output data recorded.  General appearance: alert, cooperative, no distress Resp: clear to auscultation bilaterally GI: soft, minimally distended, appropriately tender. Incision c/d/i with honeycomb  Lab Results:  Recent Labs    09/29/20 0152 09/30/20 0132  WBC 15.3* 12.1*  HGB 10.0* 9.6*  HCT 32.8* 32.0*  PLT 464* 470*    BMET Recent Labs    09/29/20 0152 09/30/20 0132  NA 130* 132*  K 3.5 3.9  CL 96* 100  CO2 22 18*  GLUCOSE 109* 95  BUN 9 8  CREATININE 0.83 0.76  CALCIUM 8.9 8.5*   PT/INR No results for input(s): LABPROT, INR in the last 72 hours.  Recent Labs  Lab 09/25/20 0228  AST 13*  ALT 12  ALKPHOS 70  BILITOT 0.6  PROT 6.2*  ALBUMIN 2.9*     Lipase  No results found for: LIPASE   Medications: . acetaminophen  1,000 mg Oral Q8H  . [START ON 10/01/2020] atorvastatin  80 mg Oral Daily  . Cenobamate  100 mg Oral Daily  . enoxaparin (LOVENOX) injection  40 mg Subcutaneous Q24H  . feeding supplement  1 Container Oral TID BM  . [START ON 10/01/2020] ferrous sulfate  325 mg Oral Q breakfast  . insulin aspart  0-6 Units Subcutaneous Q4H  . ketorolac  15 mg Intravenous Q8H  . lamoTRIgine  400 mg Oral BID  . LORazepam  1 mg Intravenous Q12H  . metoprolol tartrate  25 mg Oral BID   . pantoprazole (PROTONIX) IV  40 mg Intravenous Q24H  . PHENObarbital  65 mg Intravenous Q12H  . potassium chloride  40 mEq Oral Daily   . sodium chloride    . sodium chloride    . 0.9 % NaCl with KCl 20 mEq / L 75 mL/hr at 09/30/20 0214  . fluconazole (DIFLUCAN) IV Stopped (09/29/20 1047)  . lacosamide (VIMPAT) IV 100 mg (09/29/20 2220)  . levETIRAcetam 1,500 mg (09/30/20 0004)  . methocarbamol (ROBAXIN) IV      Assessment/Plan Hx of Seizures 4.4 cm ascending thoracic aortic aneurysm VP shunt in place HLD DM - Hbg A1c 9.6 Elevated Tnon admission - Coronary CTA showed possible high grade lesion in LAD and distal RCA and coronary FFR now shows significant flow limiting lesions in the mid RCA and mid LAD. Per cardiology he is not a candidate for DAPT with PCIuntil colon resection complete. Would recommend proceeding with surgery. Need to avoid hypotension. Once cleared from a surgical standpoint for DAPT therapy, will plan cath and PCI. This can be done in outpt setting if he is stable postop CAD Severe atherosclerosis Esophageal plaques- noted on endoscopy, brushing taken by Dr. Collene Mares ABL anemia- likely 2/2 GI loss. hgb 6.1 on admission,10.9>>11.1  - transfused 3/11 2 units; 3/12 - 1 unit; 3/15 - 2 units Moderate malnutrition -  Prealbumin 16.2  Colon Mass - Endoscopy showed a large malignant tumor in the proximal ascending colon close to the ICVthat wasbiopsied.This also showed a6 mm sessile polyp in the sigmoid colonand a10 mm sessile polyp at the hepatic flexure that were retrieved with snare. - Pathology: polyps benign, right colon mass adenocarcinoma - CT CAPordered and showed5.0 x 1.4 x 3.7 cm eccentric intraluminal and transmural proximal colonic mass highly concerning for colon cancer, no evidence of obstruction; multiplesubcentimeter lymph nodes within the right lower quadrant mesentery - CEA12.4 Laparoscopic assisted partial colectomy 09/27/20, Dr. Autumn Messing III POD#3  - WBC downtrending FEN -start clear liquid diet today VTE -SCDs ID -diflucan 3/14>>  Plan: Remove NG tube, start clear liquids. Mobilize as able        LOS: 9 days    Jeff Kelley 09/30/2020 Please see Amion

## 2020-10-01 ENCOUNTER — Other Ambulatory Visit: Payer: Self-pay

## 2020-10-01 DIAGNOSIS — D649 Anemia, unspecified: Secondary | ICD-10-CM | POA: Diagnosis not present

## 2020-10-01 LAB — CBC
HCT: 31.9 % — ABNORMAL LOW (ref 39.0–52.0)
Hemoglobin: 9.7 g/dL — ABNORMAL LOW (ref 13.0–17.0)
MCH: 20.2 pg — ABNORMAL LOW (ref 26.0–34.0)
MCHC: 30.4 g/dL (ref 30.0–36.0)
MCV: 66.3 fL — ABNORMAL LOW (ref 80.0–100.0)
Platelets: 506 10*3/uL — ABNORMAL HIGH (ref 150–400)
RBC: 4.81 MIL/uL (ref 4.22–5.81)
WBC: 8.4 10*3/uL (ref 4.0–10.5)
nRBC: 0 % (ref 0.0–0.2)

## 2020-10-01 LAB — GLUCOSE, CAPILLARY
Glucose-Capillary: 107 mg/dL — ABNORMAL HIGH (ref 70–99)
Glucose-Capillary: 118 mg/dL — ABNORMAL HIGH (ref 70–99)
Glucose-Capillary: 120 mg/dL — ABNORMAL HIGH (ref 70–99)
Glucose-Capillary: 122 mg/dL — ABNORMAL HIGH (ref 70–99)
Glucose-Capillary: 84 mg/dL (ref 70–99)
Glucose-Capillary: 92 mg/dL (ref 70–99)
Glucose-Capillary: 96 mg/dL (ref 70–99)

## 2020-10-01 LAB — BASIC METABOLIC PANEL
Anion gap: 10 (ref 5–15)
BUN: <5 mg/dL — ABNORMAL LOW (ref 6–20)
CO2: 22 mmol/L (ref 22–32)
Calcium: 8.5 mg/dL — ABNORMAL LOW (ref 8.9–10.3)
Chloride: 103 mmol/L (ref 98–111)
Creatinine, Ser: 0.71 mg/dL (ref 0.61–1.24)
GFR, Estimated: >60 mL/min (ref >60)
Glucose, Bld: 92 mg/dL (ref 70–99)
Potassium: 3.7 mmol/L (ref 3.5–5.1)
Sodium: 135 mmol/L (ref 135–145)

## 2020-10-01 LAB — MAGNESIUM: Magnesium: 1.7 mg/dL (ref 1.7–2.4)

## 2020-10-01 MED ORDER — ASPIRIN EC 81 MG PO TBEC
81.0000 mg | DELAYED_RELEASE_TABLET | Freq: Every day | ORAL | Status: DC
  Administered 2020-10-01 – 2020-10-05 (×5): 81 mg via ORAL
  Filled 2020-10-01 (×5): qty 1

## 2020-10-01 MED ORDER — SENNOSIDES-DOCUSATE SODIUM 8.6-50 MG PO TABS: 1.0000 | ORAL_TABLET | Freq: Every evening | ORAL | Status: DC | PRN

## 2020-10-01 NOTE — Progress Notes (Signed)
Progress Note  Patient Name: Jeff Kelley Date of Encounter: 10/01/2020  [facility] Cardiologist: Evalina Field, MD   Subjective   Feeling better today.  No chest pain, no shortness of breath. Just waking up he says.   Inpatient Medications    Scheduled Meds: . acetaminophen  1,000 mg Oral Q8H  . atorvastatin  80 mg Oral Daily  . Cenobamate  100 mg Oral Daily  . enoxaparin (LOVENOX) injection  40 mg Subcutaneous Q24H  . feeding supplement  1 Container Oral TID BM  . ferrous sulfate  325 mg Oral Q breakfast  . insulin aspart  0-6 Units Subcutaneous Q4H  . ketorolac  15 mg Intravenous Q8H  . lamoTRIgine  400 mg Oral BID  . LORazepam  1 mg Intravenous Q12H  . metoprolol tartrate  25 mg Oral BID  . pantoprazole (PROTONIX) IV  40 mg Intravenous Q24H  . PHENObarbital  65 mg Intravenous Q12H  . potassium chloride  40 mEq Oral Daily   Continuous Infusions: . sodium chloride    . sodium chloride    . 0.9 % NaCl with KCl 20 mEq / L 75 mL/hr at 09/30/20 0214  . fluconazole (DIFLUCAN) IV 100 mg (09/30/20 0847)  . lacosamide (VIMPAT) IV 100 mg (09/30/20 2201)  . levETIRAcetam 1,500 mg (10/01/20 0029)  . methocarbamol (ROBAXIN) IV     PRN Meds: sodium chloride, fentaNYL (SUBLIMAZE) injection, LORazepam, methocarbamol (ROBAXIN) IV, mineral oil-hydrophilic petrolatum, ondansetron (ZOFRAN) IV, oxyCODONE, senna-docusate   Vital Signs    Vitals:   09/30/20 0338 09/30/20 1251 09/30/20 2032 10/01/20 0350  BP: (!) 147/86 140/90 (!) 151/94 (!) 155/95  Pulse: 86  97 90  Resp: 20 19 18 20   Temp: 97.9 F (36.6 C) 98.7 F (37.1 C) 98.3 F (36.8 C) (!) 97.5 F (36.4 C)  TempSrc:  Oral    SpO2: 94% 99% 97% 99%  Weight:      Height:        Intake/Output Summary (Last 24 hours) at 10/01/2020 0847 Last data filed at 10/01/2020 0600 Gross per 24 hour  Intake 1885.59 ml  Output 1325 ml  Net 560.59 ml   Last 3 Weights 09/27/2020 09/26/2020 09/23/2020  Weight (lbs) 192 lb 10.9  oz 192 lb 10.9 oz 191 lb 8 oz  Weight (kg) 87.4 kg 87.4 kg 86.864 kg     Physical Exam   GEN: No acute distress.   Neck: No JVD Cardiac: RRR, no murmurs, rubs, or gallops.  Respiratory: Clear to auscultation bilaterally. GI: Soft, nontender, non-distended, postop changes noted MS: No edema; No deformity. Neuro:  Nonfocal  Psych: Normal affect   Labs    High Sensitivity Troponin:   Recent Labs  Lab 09/21/20 2051 09/21/20 2245  TROPONINIHS 109* 110*      Chemistry Recent Labs  Lab 09/25/20 0228 09/25/20 1647 09/29/20 0152 09/30/20 0132 10/01/20 0144  NA 135   < > 130* 132* 135  K 3.3*   < > 3.5 3.9 3.7  CL 103   < > 96* 100 103  CO2 24   < > 22 18* 22  GLUCOSE 117*   < > 109* 95 92  BUN 7   < > 9 8 <5*  CREATININE 0.70   < > 0.83 0.76 0.71  CALCIUM 8.8*   < > 8.9 8.5* 8.5*  PROT 6.2*  --   --   --   --   ALBUMIN 2.9*  --   --   --   --  AST 13*  --   --   --   --   ALT 12  --   --   --   --   ALKPHOS 70  --   --   --   --   BILITOT 0.6  --   --   --   --   GFRNONAA >60   < > >60 >60 >60  ANIONGAP 8   < > 12 14 10    < > = values in this interval not displayed.     Hematology Recent Labs  Lab 09/29/20 0152 09/30/20 0132 10/01/20 0144  WBC 15.3* 12.1* 8.4  RBC 5.00 4.81 4.81  HGB 10.0* 9.6* 9.7*  HCT 32.8* 32.0* 31.9*  MCV 65.6* 66.5* 66.3*  MCH 20.0* 20.0* 20.2*  MCHC 30.5 30.0 30.4  RDW Not Measured Not Measured Not Measured  PLT 464* 470* 506*    BNPNo results for input(s): BNP, PROBNP in the last 168 hours.   DDimer No results for input(s): DDIMER in the last 168 hours.   Radiology    No results found.  Cardiac Studies   Coronary CTA-high-grade lesion LAD distal RCA with flow limitation by FFR.  Patient Profile     50 y.o. male with newly discovered adenocarcinoma of the colon with colectomy admitted with severe anemia and mildly elevated troponin 110.  Flat.  Demand ischemia.  Assessment & Plan    Coronary artery  disease Adenocarcinoma of colon Severe anemia secondary to colon cancer with demand ischemia/elevated troponin VP shunt 4.4 cm ascending thoracic aortic aneurysm Esophageal plaques Diabetes with hyperlipidemia  -LAD, distal RCA lesion. -Recommend cardiac catheterization/PCI as outpatient once fully healed from colectomy. -Continue with goal-directed therapy at this time, metoprolol, statin on atorvastatin 80), aspirin 81 once comfortable from surgical perspective.  Maintain goal blood pressure.  We will have follow-up with Dr. Marry Guan as outpatient in about 4 weeks.  We will set up appointment.  We will go ahead and sign off.  Please let us know if we can be of further assistance.    For questions or updates, please contact [place] Please consult [email or web address] for contact info under        Signed, Candee Furbish, MD  10/01/2020, 8:47 AM

## 2020-10-01 NOTE — Progress Notes (Addendum)
4 Days Post-Op    CC: Colon mass  Subjective: Patient sitting up in the chair.  Reports taking clear liquids and candy.  He says he is having some loose stools.  He also reports passing flatus.  He is not liking the chicken broth.  He says a long time he has been able to walk is when his girlfriend is present to go with him.  Objective: Vital signs in last 24 hours: Temp:  [97.5 F (36.4 C)-98.7 F (37.1 C)] 97.5 F (36.4 C) (03/21 0350) Pulse Rate:  [90-97] 90 (03/21 0350) Resp:  [18-20] 20 (03/21 0350) BP: (140-155)/(90-95) 155/95 (03/21 0350) SpO2:  [97 %-99 %] 99 % (03/21 0350) Last BM Date: 09/25/20 1885 IV 1325 urine No p.o. intake or BM recorded. Afebrile, vital signs are stable  potassium 3.7, creatinine 0.71, magnesium 1.7 WBC 8.4 H/H 9.7/31.9 Platelets 506,000  Intake/Output from previous day: 03/20 0701 - 03/21 0700 In: 1885.6 [I.V.:1615.6; IV Piggyback:270] Out: 3149 [Urine:1325] Intake/Output this shift: Total I/O In: -  Out: 500 [Urine:500]  General appearance: alert, cooperative and no distress Resp: clear to auscultation bilaterally Cardio: Regular rate GI: Soft, sore, midline incision dressing is clean and dry.  He reports tolerating clears, having flatus and some liquid stool.  Lab Results:  Recent Labs    09/30/20 0132 10/01/20 0144  WBC 12.1* 8.4  HGB 9.6* 9.7*  HCT 32.0* 31.9*  PLT 470* 506*    BMET Recent Labs    09/30/20 0132 10/01/20 0144  NA 132* 135  K 3.9 3.7  CL 100 103  CO2 18* 22  GLUCOSE 95 92  BUN 8 <5*  CREATININE 0.76 0.71  CALCIUM 8.5* 8.5*   PT/INR No results for input(s): LABPROT, INR in the last 72 hours.  Recent Labs  Lab 09/25/20 0228  AST 13*  ALT 12  ALKPHOS 70  BILITOT 0.6  PROT 6.2*  ALBUMIN 2.9*     Lipase  No results found for: LIPASE   Medications: . acetaminophen  1,000 mg Oral Q8H  . atorvastatin  80 mg Oral Daily  . Cenobamate  100 mg Oral Daily  . enoxaparin (LOVENOX) injection   40 mg Subcutaneous Q24H  . feeding supplement  1 Container Oral TID BM  . ferrous sulfate  325 mg Oral Q breakfast  . insulin aspart  0-6 Units Subcutaneous Q4H  . ketorolac  15 mg Intravenous Q8H  . lamoTRIgine  400 mg Oral BID  . LORazepam  1 mg Intravenous Q12H  . metoprolol tartrate  25 mg Oral BID  . pantoprazole (PROTONIX) IV  40 mg Intravenous Q24H  . PHENObarbital  65 mg Intravenous Q12H  . potassium chloride  40 mEq Oral Daily   . sodium chloride    . sodium chloride    . 0.9 % NaCl with KCl 20 mEq / L 75 mL/hr at 10/01/20 0851  . fluconazole (DIFLUCAN) IV 100 mg (10/01/20 0858)  . lacosamide (VIMPAT) IV 100 mg (09/30/20 2201)  . levETIRAcetam 1,500 mg (10/01/20 0029)  . methocarbamol (ROBAXIN) IV     Anti-infectives (From admission, onward)   Start     Dose/Rate Route Frequency Ordered Stop   09/29/20 0900  fluconazole (DIFLUCAN) IVPB 100 mg        100 mg 50 mL/hr over 60 Minutes Intravenous Every 24 hours 09/28/20 1648 10/02/20 0859   09/29/20 0900  fluconazole (DIFLUCAN) IVPB 100 mg  Status:  Discontinued        100  mg 50 mL/hr over 60 Minutes Intravenous Every 24 hours 09/28/20 1650 09/28/20 1651   09/27/20 1237  sodium chloride 0.9 % with cefoTEtan (CEFOTAN) ADS Med       Note to Pharmacy: Rocky Morel   : cabinet override      09/27/20 1237 09/27/20 1430   09/26/20 0600  cefoTEtan (CEFOTAN) 2 g in sodium chloride 0.9 % 100 mL IVPB  Status:  Discontinued        2 g 200 mL/hr over 30 Minutes Intravenous On call to O.R. 09/25/20 1412 09/27/20 0559   09/24/20 1600  fluconazole (DIFLUCAN) tablet 100 mg  Status:  Discontinued        100 mg Oral Daily 09/24/20 1552 09/28/20 1648      Assessment/Plan Hx of Seizures 4.4 cm ascending thoracic aortic aneurysm VP shunt in place HLD DM - Hbg A1c 9.6 Elevated Tnon admission-Coronary CTA showed possible high grade lesion in LAD and distal RCA and coronary FFR now shows significant flow limiting lesions in the mid  RCA and mid LAD. Per cardiologyhe is not a candidate for DAPT with PCIuntil colon resection complete.Would recommend proceeding with surgery. Need to avoid hypotension.Once cleared from a surgical standpoint for DAPT therapy, will plan cath and PCI. This can be done in outpt setting if he is stable postop CAD Severe atherosclerosis Esophageal plaques- noted on endoscopy, brushing taken by Dr. Collene Mares ABL anemia- likely 2/2 GI loss. hgb 6.1 on admission,10.9>>11.1  - transfused 3/11 2 units; 3/12 - 1 unit; 3/15 - 2 units Moderate malnutrition - Prealbumin 16.2  Colon Mass - Endoscopy showed a large malignant tumor in the proximal ascending colon close to the ICVthat wasbiopsied.This also showed a6 mm sessile polyp in the sigmoid colonand a10 mm sessile polyp at the hepatic flexure that were retrieved with snare. -Pathology: polyps benign, right colon mass adenocarcinoma - CT CAPordered and showed5.0 x 1.4 x 3.7 cm eccentric intraluminal and transmural proximal colonic mass highly concerning for colon cancer, no evidence of obstruction; multiplesubcentimeter lymph nodes within the right lower quadrant mesentery - CEA12.4 Laparoscopic assisted partial colectomy 09/27/20, Dr. Autumn Messing III POD#3  - WBC downtrending  - path still pending    FEN -start clear liquid diet today VTE -SCDs ID -diflucan 3/14-3/22  Plan: Advance to full liquids, he can go back to his p.o. medications as before surgery.  We will get PT evaluation for mobilization, asked the floor again to walk him more.  If he does well with the full liquids we can advance him to a soft diet.  From our standpoint he can go forward with PCI/aspirin/Plavix if needed by cardiology.    LOS: 10 days    Danai Gotto 10/01/2020 Please see Amion

## 2020-10-01 NOTE — Progress Notes (Signed)
PROGRESS NOTE    Jeff Kelley  JIR:[id number] DOB: 01-30-1971 DOA: 09/21/2020 PCP: Patient, No Pcp Per   Brief Narrative:  50 y.o.malewith medical history significant forseizure disorder and anxiety, now presenting to the emergency department for evaluation of shortness of breath. Patient began to notice shortness of breath with exertion approximately 1 month ago, slowly progressed to the point where he was dyspneic at rest.  Upon admission patient was noted to be anemic with hemoglobin of 6.1 requiring transfusion.  Underwent EGD/colonoscopy in 09/24/2020. Discussed with GI Dr. Collene Mares findings concerning for esophageal candidiasis and a large mass in the colon.    Elevated CEA.    Chest abdomen pelvis done for staging..  Started gentle IV fluid hydration to prevent contrast-induced nephropathy.    Cardiology, GI and general surgery has been consulted.  Underwent partial colectomy 3/17.  Awaiting bowel recovery  Assessment & Plan:   Principal Problem:   Symptomatic anemia Active Problems:   Seizures (HCC)   Occult GI bleeding   Elevated troponin   CHF (congestive heart failure) (HCC)   Hyperglycemia   Iron deficiency anemia   Hyponatremia   Abnormal echocardiogram   New diagnosis of adenocarcinoma: Status post partial colectomy 3/17 Symptomatic iron deficiency anemia -Initially had dyspnea on exertion and shortness of breath from anemia. -Endoscopy showed esophageal Candida therefore started on Diflucan 100 mg for 7 days -Colonoscopy showed multiple sessile polyp which was removed and a large malignant tumor status post biopsy- Adenocarcinoma -CEA levels elevated -CT chest abdomen pelvis-intraluminal and transmural proximal colonic mass seen, multiple subcentimeter lymph nodes, some groundglass opacities. -Status post partial colectomy 3/17 -Dr. Learta Codding has arranged for outpatient follow-up appointment in 3 weeks -Diet per Gen Surgery.   Esophageal Candida  Completed 7 day  course for Difluca on 3/20  Newly diagnosed ascending aortic aneurysm measuring 4.4 cm Patient made aware of this finding and of recommendations.  ACCF/AHA guidelines. Recommended annual imaging follow-up by CTA or MRA.  Severe symptomatic iron deficiency anemia with positive FOBT likely secondary to chronic blood loss in the setting of large malignant colon mass. Secondary to iron deficiency.  Initially received PRBC transfusion.  Hemoglobin stable.  Monitor postoperatively.  PO iron. Bowel regimen prn.   Elevated troponin likely secondary to demand ischemia in the setting of severe anemia Diastolic CHF/mid and distal anterior septum, apical anterior segments are hypokinetic. Overall echocardiogram is unremarkable.  CT coronary showed elevated calcium scores.  High FFR.  Eventually will require cardiac catheterization.  Will defer timing to cardiology. Continue Lipitor 2D echo showing LVEF 56% left ventricle demonstrate regional wall motion abnormalities, left ventricle diastolic parameters are consistent with grade 2 diastolic dysfunction. Cardiology following.  ASA 81mg  po daily.   Type 2 diabetes, fairly well controlled for now.  Hemoglobin A1c 9.6 on 09/30/2020 Continue insulin sliding scale. Continue to monitor CBGs  Seizure disorder Keppra and phenobarbital been converted to IV.  Patient is on PO Cenobamate and Lamictal PO but no IV form therefore neurology consulted who was started IV Vimpat.  IV Ativan as needed. Once he is thoroughly tolerating PO, we can change back to his home PO meds.   Chronic anxiety Continue home regimen.    DVT prophylaxis: Lovenox Code Status: Full code Family Communication:    Status is: Inpatient  Remains inpatient appropriate because:Inpatient level of care appropriate due to severity of illness   Dispo:  Patient From: Home  Planned Disposition: Home  Medically stable for discharge: No.  Currently postop,  routine recovery.   Management per general surgery.  Body mass index is 31.11 kg/m.   Subjective: Feels ok, no new complaints. Full liquid diet started.   Review of Systems Otherwise negative except as per HPI, including: General = no fevers, chills, dizziness,  fatigue HEENT/EYES = negative for loss of vision, double vision, blurred vision,  sore throa Cardiovascular= negative for chest pain, palpitation Respiratory/lungs= negative for shortness of breath, cough, wheezing; hemoptysis,  Gastrointestinal= negative for nausea, vomiting, abdominal pain Genitourinary= negative for Dysuria MSK = Negative for arthralgia, myalgias Neurology= Negative for headache, numbness, tingling  Psychiatry= Negative for suicidal and homocidal ideation Skin= Negative for Rash   Examination: Constitutional: Not in acute distress Respiratory: Clear to auscultation bilaterally Cardiovascular: Normal sinus rhythm, no rubs Abdomen: Nontender nondistended good bowel sounds Musculoskeletal: No edema noted Skin: insision site on abd looks ok.  Neurologic: CN 2-12 grossly intact.  And nonfocal Psychiatric: Normal judgment and insight. Alert and oriented x 3. Normal mood.    Objective: Vitals:   09/30/20 0338 09/30/20 1251 09/30/20 2032 10/01/20 0350  BP: (!) 147/86 140/90 (!) 151/94 (!) 155/95  Pulse: 86  97 90  Resp: 20 19 18 20   Temp: 97.9 F (36.6 C) 98.7 F (37.1 C) 98.3 F (36.8 C) (!) 97.5 F (36.4 C)  TempSrc:  Oral    SpO2: 94% 99% 97% 99%  Weight:      Height:        Intake/Output Summary (Last 24 hours) at 10/01/2020 0830 Last data filed at 10/01/2020 0600 Gross per 24 hour  Intake 1885.59 ml  Output 1325 ml  Net 560.59 ml   Filed Weights   09/23/20 0830 09/26/20 0500 09/27/20 1241  Weight: 86.9 kg 87.4 kg 87.4 kg     Data Reviewed:   CBC: Recent Labs  Lab 09/27/20 0420 09/28/20 0334 09/29/20 0152 09/30/20 0132 10/01/20 0144  WBC 9.5 21.9* 15.3* 12.1* 8.4  HGB 10.9* 11.1* 10.0* 9.6*  9.7*  HCT 36.0* 37.3* 32.8* 32.0* 31.9*  MCV 65.2* 66.0* 65.6* 66.5* 66.3*  PLT 431* 477* 464* 470* 924*   Basic Metabolic Panel: Recent Labs  Lab 09/25/20 0228 09/25/20 1647 09/26/20 0555 09/27/20 0420 09/28/20 0334 09/29/20 0152 09/30/20 0132 10/01/20 0144  NA 135 132* 138 135 133* 130* 132* 135  K 3.3* 3.8 3.8 3.7 3.9 3.5 3.9 3.7  CL 103 100 103 99 96* 96* 100 103  CO2 24 22 26 25  21* 22 18* 22  GLUCOSE 117* 126* 120* 143* 138* 109* 95 92  BUN 7 5* <5* <5* 7 9 8  <5*  CREATININE 0.70 0.71 0.78 0.85 1.03 0.83 0.76 0.71  CALCIUM 8.8* 8.9 9.2 9.5 9.6 8.9 8.5* 8.5*  MG 2.0 1.9 1.9  --   --   --  1.9 1.7  PHOS  --  3.4  --   --   --   --   --   --    GFR: Estimated Creatinine Clearance: 114.4 mL/min (by C-G formula based on SCr of 0.71 mg/dL). Liver Function Tests: Recent Labs  Lab 09/25/20 0228  AST 13*  ALT 12  ALKPHOS 70  BILITOT 0.6  PROT 6.2*  ALBUMIN 2.9*   No results for input(s): LIPASE, AMYLASE in the last 168 hours. No results for input(s): AMMONIA in the last 168 hours. Coagulation Profile: Recent Labs  Lab 09/26/20 0555  INR 1.1   Cardiac Enzymes: No results for input(s): CKTOTAL, CKMB, CKMBINDEX, TROPONINI in the last 168 hours.  BNP (last 3 results) No results for input(s): PROBNP in the last 8760 hours. HbA1C: No results for input(s): HGBA1C in the last 72 hours. CBG: Recent Labs  Lab 09/30/20 1524 09/30/20 2034 10/01/20 0002 10/01/20 0345 10/01/20 0742  GLUCAP 76 99 96 92 84   Lipid Profile: No results for input(s): CHOL, HDL, LDLCALC, TRIG, CHOLHDL, LDLDIRECT in the last 72 hours. Thyroid Function Tests: No results for input(s): TSH, T4TOTAL, FREET4, T3FREE, THYROIDAB in the last 72 hours. Anemia Panel: No results for input(s): VITAMINB12, FOLATE, FERRITIN, TIBC, IRON, RETICCTPCT in the last 72 hours. Sepsis Labs: No results for input(s): PROCALCITON, LATICACIDVEN in the last 168 hours.  Recent Results (from the past 240 hour(s))   SARS CORONAVIRUS 2 (TAT 6-24 HRS) Nasopharyngeal Nasopharyngeal Swab     Status: None   Collection Time: 09/21/20  8:45 PM   Specimen: Nasopharyngeal Swab  Result Value Ref Range Status   SARS Coronavirus 2 NEGATIVE NEGATIVE Final    Comment: (NOTE) SARS-CoV-2 target nucleic acids are NOT DETECTED.  The SARS-CoV-2 RNA is generally detectable in upper and lower respiratory specimens during the acute phase of infection. Negative results do not preclude SARS-CoV-2 infection, do not rule out co-infections with other pathogens, and should not be used as the sole basis for treatment or other patient management decisions. Negative results must be combined with clinical observations, patient history, and epidemiological information. The expected result is Negative.  Fact Sheet for Patients: SugarRoll.be  Fact Sheet for Healthcare Providers: https://www.woods-mathews.com/  This test is not yet approved or cleared by the [place] FDA and  has been authorized for detection and/or diagnosis of SARS-CoV-2 by FDA under an Emergency Use Authorization (EUA). This EUA will remain  in effect (meaning this test can be used) for the duration of the COVID-19 declaration under Se ction 564(b)(1) of the Act, 21 U.S.C. section 360bbb-3(b)(1), unless the authorization is terminated or revoked sooner.  Performed at [facility], [place] [address], [place], [place] [address]   MRSA PCR Screening     Status: None   Collection Time: 09/22/20  4:25 AM   Specimen: Nasopharyngeal  Result Value Ref Range Status   MRSA by PCR NEGATIVE NEGATIVE Final    Comment:        The GeneXpert MRSA Assay (FDA approved for NASAL specimens only), is one component of a comprehensive MRSA colonization surveillance program. It is not intended to diagnose MRSA infection nor to guide or monitor treatment for MRSA infections. Performed at [facility], [place]  [address], [place], [place] [address]          Radiology Studies: No results found.      Scheduled Meds: . acetaminophen  1,000 mg Oral Q8H  . atorvastatin  80 mg Oral Daily  . Cenobamate  100 mg Oral Daily  . enoxaparin (LOVENOX) injection  40 mg Subcutaneous Q24H  . feeding supplement  1 Container Oral TID BM  . ferrous sulfate  325 mg Oral Q breakfast  . insulin aspart  0-6 Units Subcutaneous Q4H  . ketorolac  15 mg Intravenous Q8H  . lamoTRIgine  400 mg Oral BID  . LORazepam  1 mg Intravenous Q12H  . metoprolol tartrate  25 mg Oral BID  . pantoprazole (PROTONIX) IV  40 mg Intravenous Q24H  . PHENObarbital  65 mg Intravenous Q12H  . potassium chloride  40 mEq Oral Daily   Continuous Infusions: . sodium chloride    . sodium chloride    .  0.9 % NaCl with KCl 20 mEq / L 75 mL/hr at 09/30/20 0214  . fluconazole (DIFLUCAN) IV 100 mg (09/30/20 0847)  . lacosamide (VIMPAT) IV 100 mg (09/30/20 2201)  . levETIRAcetam 1,500 mg (10/01/20 0029)  . methocarbamol (ROBAXIN) IV       LOS: 10 days   Time spent= 35 mins    Ankit Arsenio Loader, MD [facility]  If 7PM-7AM, please contact night-coverage  10/01/2020, 8:30 AM
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
## 2020-10-02 LAB — BASIC METABOLIC PANEL
Anion gap: 10 (ref 5–15)
BUN: <5 mg/dL — ABNORMAL LOW (ref 6–20)
CO2: 23 mmol/L (ref 22–32)
Calcium: 8.9 mg/dL (ref 8.9–10.3)
Chloride: 102 mmol/L (ref 98–111)
Creatinine, Ser: 0.65 mg/dL (ref 0.61–1.24)
GFR, Estimated: >60 mL/min (ref >60)
Glucose, Bld: 122 mg/dL — ABNORMAL HIGH (ref 70–99)
Potassium: 3.7 mmol/L (ref 3.5–5.1)
Sodium: 135 mmol/L (ref 135–145)

## 2020-10-02 LAB — GLUCOSE, CAPILLARY
Glucose-Capillary: 110 mg/dL — ABNORMAL HIGH (ref 70–99)
Glucose-Capillary: 108 mg/dL — ABNORMAL HIGH (ref 70–99)
Glucose-Capillary: 143 mg/dL — ABNORMAL HIGH (ref 70–99)
Glucose-Capillary: 130 mg/dL — ABNORMAL HIGH (ref 70–99)
Glucose-Capillary: 205 mg/dL — ABNORMAL HIGH (ref 70–99)
Glucose-Capillary: 123 mg/dL — ABNORMAL HIGH (ref 70–99)

## 2020-10-02 LAB — CBC
HCT: 34.6 % — ABNORMAL LOW (ref 39.0–52.0)
Hemoglobin: 10.2 g/dL — ABNORMAL LOW (ref 13.0–17.0)
MCH: 19.7 pg — ABNORMAL LOW (ref 26.0–34.0)
MCHC: 29.5 g/dL — ABNORMAL LOW (ref 30.0–36.0)
MCV: 66.8 fL — ABNORMAL LOW (ref 80.0–100.0)
Platelets: 651 10*3/uL — ABNORMAL HIGH (ref 150–400)
RBC: 5.18 MIL/uL (ref 4.22–5.81)
WBC: 8.7 10*3/uL (ref 4.0–10.5)
nRBC: 0 % (ref 0.0–0.2)

## 2020-10-02 LAB — MAGNESIUM: Magnesium: 1.6 mg/dL — ABNORMAL LOW (ref 1.7–2.4)

## 2020-10-02 MED ORDER — MAGNESIUM CHLORIDE 64 MG PO TBEC
1.0000 | DELAYED_RELEASE_TABLET | Freq: Two times a day (BID) | ORAL | Status: AC
  Administered 2020-10-02 (×2): 64 mg via ORAL
  Filled 2020-10-02 (×2): qty 1

## 2020-10-02 NOTE — Progress Notes (Signed)
PROGRESS NOTE    Jeff Kelley  AST:[id number] DOB: 12-Jun-1971 DOA: 09/21/2020 PCP: Patient, No Pcp Per   Brief Narrative:  50 y.o. male with medical history significant for seizure disorder and anxiety, now presenting to the emergency department for evaluation of shortness of breath.  Patient began to notice shortness of breath with exertion approximately 1 month ago, slowly progressed to the point where he was dyspneic at rest.  Upon admission patient was noted to be anemic with hemoglobin of 6.1 requiring transfusion.  Underwent EGD/colonoscopy in 09/24/2020. Discussed with GI Dr. Collene Mares findings concerning for esophageal candidiasis and a large mass in the colon.    Elevated CEA.    Chest abdomen pelvis done for staging. Oncology,  Cardiology, GI and general surgery has been consulted.  Underwent partial colectomy 3/17.  Awaiting bowel recovery, advance diet as tolerated. Routine post Op recovery.   Assessment & Plan:   Principal Problem:   Symptomatic anemia Active Problems:   Seizures (HCC)   Occult GI bleeding   Elevated troponin   CHF (congestive heart failure) (HCC)   Hyperglycemia   Iron deficiency anemia   Hyponatremia   Abnormal echocardiogram   New diagnosis of adenocarcinoma: Status post partial colectomy 3/17 Symptomatic iron deficiency anemia -Initially had dyspnea on exertion and shortness of breath from anemia. -Endoscopy showed esophageal Candida status post 7-day Diflucan treatment -Colonoscopy showed multiple sessile polyp which was removed and a large malignant tumor status post biopsy- Adenocarcinoma -In-hospital cancer work-up is complete.  Has follow-up appointment with Dr. Learta Codding in 3 weeks post dc -Diet per Gen Surgery.  Soft diet this morning.   Esophageal Candida  Completed 7 day course for Difluca on 3/20   Newly diagnosed ascending aortic aneurysm measuring 4.4 cm Patient made aware of this finding and of recommendations.  ACCF/AHA  guidelines. Recommended annual imaging follow-up by CTA or MRA.   Severe symptomatic iron deficiency anemia with positive FOBT likely secondary to chronic blood loss in the setting of large malignant colon mass. Secondary to iron deficiency.  Initially received PRBC transfusion.  Hemoglobin stable.  Monitor postoperatively.  PO iron, bowel regimen.    Elevated troponin likely secondary to demand ischemia in the setting of severe anemia Diastolic CHF/mid and distal anterior septum, apical anterior segments are hypokinetic. Overall echocardiogram is unremarkable.  CT coronary showed elevated calcium scores.  High FFR.  Seen By cardiology, outpatient LHC at somepoint.  Continue Lipitor and ASA 2D echo showing LVEF 56% left ventricle demonstrate regional wall motion abnormalities, left ventricle diastolic parameters are consistent with grade 2 diastolic dysfunction.   Type 2 diabetes, fairly well controlled for now.  Hemoglobin A1c 9.6 on 09/30/2020 Continue insulin sliding scale. Continue to monitor CBGs   Seizure disorder Currently concerned about complete obstruction of his medication.  Seen by neurology.  Continue IV Keppra, Vimpat and phenobarbital, p.o. Lamictal.  Neurology wants to wait before completely transitioning over to p.o.   Chronic anxiety Continue home regimen.      DVT prophylaxis: Lovenox Code Status: Full code Family Communication:  Periodically updated.   Status is: Inpatient  Remains inpatient appropriate because:Inpatient level of care appropriate due to severity of illness   Dispo:  Patient From: Home  Planned Disposition: Home  Medically stable for discharge: No.  Currently postop, routine recovery.  Management per general surgery.  Body mass index is 32.08 kg/m.   Subjective: Sitting up at the bedside in the chair eating his breakfast.  Passing gas.  No  complaints Tells me yesterday evening he felt weak and had a fall but no loss of consciousness or  head trauma. This morning he was able to ambulate with the assistance of PT  Review of Systems Otherwise negative except as per HPI, including: General: Denies fever, chills, night sweats or unintended weight loss. Resp: Denies cough, wheezing, shortness of breath. Cardiac: Denies chest pain, palpitations, orthopnea, paroxysmal nocturnal dyspnea. GI: Denies abdominal pain, nausea, vomiting, diarrhea or constipation GU: Denies dysuria, frequency, hesitancy or incontinence MS: Denies muscle aches, joint pain or swelling Neuro: Denies headache, neurologic deficits (focal weakness, numbness, tingling), abnormal gait Psych: Denies anxiety, depression, SI/HI/AVH Skin: Denies new rashes or lesions ID: Denies sick contacts, exotic exposures, travel  Examination: Constitutional: Not in acute distress Respiratory: Clear to auscultation bilaterally Cardiovascular: Normal sinus rhythm, no rubs Abdomen: Nontender nondistended good bowel sounds Musculoskeletal: No edema noted Skin: N surgical incision without any evidence of bleeding or infection Neurologic: CN 2-12 grossly intact.  And nonfocal Psychiatric: Normal judgment and insight. Alert and oriented x 3. Normal mood.  Objective: Vitals:   10/01/20 2055 10/02/20 0431 10/02/20 0447 10/02/20 0500  BP: 131/75 (!) 157/100 (!) 157/96   Pulse: 88 90    Resp: 20 20    Temp: 98.7 F (37.1 C) 98 F (36.7 C)    TempSrc: Oral Oral    SpO2: 97% 98%    Weight:    90.1 kg  Height:        Intake/Output Summary (Last 24 hours) at 10/02/2020 5361 Last data filed at 10/02/2020 0500 Gross per 24 hour  Intake --  Output 3400 ml  Net -3400 ml   Filed Weights   09/26/20 0500 09/27/20 1241 10/02/20 0500  Weight: 87.4 kg 87.4 kg 90.1 kg     Data Reviewed:   CBC: Recent Labs  Lab 09/28/20 0334 09/29/20 0152 09/30/20 0132 10/01/20 0144 10/02/20 0124  WBC 21.9* 15.3* 12.1* 8.4 8.7  HGB 11.1* 10.0* 9.6* 9.7* 10.2*  HCT 37.3* 32.8* 32.0*  31.9* 34.6*  MCV 66.0* 65.6* 66.5* 66.3* 66.8*  PLT 477* 464* 470* 506* 443*   Basic Metabolic Panel: Recent Labs  Lab 09/25/20 1647 09/26/20 0555 09/27/20 0420 09/28/20 0334 09/29/20 0152 09/30/20 0132 10/01/20 0144 10/02/20 0124  NA 132* 138   < > 133* 130* 132* 135 135  K 3.8 3.8   < > 3.9 3.5 3.9 3.7 3.7  CL 100 103   < > 96* 96* 100 103 102  CO2 22 26   < > 21* 22 18* 22 23  GLUCOSE 126* 120*   < > 138* 109* 95 92 122*  BUN 5* <5*   < > 7 9 8  <5* <5*  CREATININE 0.71 0.78   < > 1.03 0.83 0.76 0.71 0.65  CALCIUM 8.9 9.2   < > 9.6 8.9 8.5* 8.5* 8.9  MG 1.9 1.9  --   --   --  1.9 1.7 1.6*  PHOS 3.4  --   --   --   --   --   --   --    < > = values in this interval not displayed.   GFR: Estimated Creatinine Clearance: 116.1 mL/min (by C-G formula based on SCr of 0.65 mg/dL). Liver Function Tests: No results for input(s): AST, ALT, ALKPHOS, BILITOT, PROT, ALBUMIN in the last 168 hours. No results for input(s): LIPASE, AMYLASE in the last 168 hours. No results for input(s): AMMONIA in the last 168 hours. Coagulation  Profile: Recent Labs  Lab 09/26/20 0555  INR 1.1   Cardiac Enzymes: No results for input(s): CKTOTAL, CKMB, CKMBINDEX, TROPONINI in the last 168 hours. BNP (last 3 results) No results for input(s): PROBNP in the last 8760 hours. HbA1C: No results for input(s): HGBA1C in the last 72 hours. CBG: Recent Labs  Lab 10/01/20 1643 10/01/20 1915 10/01/20 2322 10/02/20 0407 10/02/20 0749  GLUCAP 107* 118* 120* 110* 108*   Lipid Profile: No results for input(s): CHOL, HDL, LDLCALC, TRIG, CHOLHDL, LDLDIRECT in the last 72 hours. Thyroid Function Tests: No results for input(s): TSH, T4TOTAL, FREET4, T3FREE, THYROIDAB in the last 72 hours. Anemia Panel: No results for input(s): VITAMINB12, FOLATE, FERRITIN, TIBC, IRON, RETICCTPCT in the last 72 hours. Sepsis Labs: No results for input(s): PROCALCITON, LATICACIDVEN in the last 168 hours.  No results found  for this or any previous visit (from the past 240 hour(s)).       Radiology Studies: No results found.      Scheduled Meds:  acetaminophen  1,000 mg Oral Q8H   aspirin EC  81 mg Oral Daily   atorvastatin  80 mg Oral Daily   Cenobamate  100 mg Oral Daily   enoxaparin (LOVENOX) injection  40 mg Subcutaneous Q24H   feeding supplement  1 Container Oral TID BM   ferrous sulfate  325 mg Oral Q breakfast   insulin aspart  0-6 Units Subcutaneous Q4H   lamoTRIgine  400 mg Oral BID   LORazepam  1 mg Intravenous Q12H   metoprolol tartrate  25 mg Oral BID   pantoprazole (PROTONIX) IV  40 mg Intravenous Q24H   PHENObarbital  65 mg Intravenous Q12H   potassium chloride  40 mEq Oral Daily   Continuous Infusions:  sodium chloride     sodium chloride     0.9 % NaCl with KCl 20 mEq / L 75 mL/hr at 10/01/20 2116   lacosamide (VIMPAT) IV 100 mg (10/01/20 2230)   levETIRAcetam 1,500 mg (10/02/20 0051)   methocarbamol (ROBAXIN) IV       LOS: 11 days   Time spent= 35 mins    Ankit Arsenio Loader, MD [facility]  If 7PM-7AM, please contact night-coverage  10/02/2020, 8:22 AM

## 2020-10-02 NOTE — Progress Notes (Signed)
Progress Note  5 Days Post-Op  Subjective: Patient reports he had a fall overnight getting up to the chair and now he is on "house arrest". He reports he tolerated FLD yesterday but had some diarrhea. Abdomen feels tight at times but denies nausea.   Objective: Vital signs in last 24 hours: Temp:  [98 F (36.7 C)-98.8 F (37.1 C)] 98 F (36.7 C) (03/22 0431) Pulse Rate:  [88-90] 90 (03/22 0431) Resp:  [19-20] 20 (03/22 0431) BP: (131-157)/(75-100) 157/96 (03/22 0447) SpO2:  [97 %-98 %] 98 % (03/22 0431) Weight:  [90.1 kg] 90.1 kg (03/22 0500) Last BM Date: 09/25/20  Intake/Output from previous day: 03/21 0701 - 03/22 0700 In: -  Out: 3400 [Urine:3400] Intake/Output this shift: No intake/output data recorded.  PE: General: WD, overweight male who is laying in bed in NAD Heart: regular, rate, and rhythm.  Lungs: CTAB, no wheezes, rhonchi, or rales noted.  Respiratory effort nonlabored Abd: soft, appropriately ttp, ND, +BS, incision c/d/i with honeycomb present     Lab Results:  Recent Labs    10/01/20 0144 10/02/20 0124  WBC 8.4 8.7  HGB 9.7* 10.2*  HCT 31.9* 34.6*  PLT 506* 651*   BMET Recent Labs    10/01/20 0144 10/02/20 0124  NA 135 135  K 3.7 3.7  CL 103 102  CO2 22 23  GLUCOSE 92 122*  BUN <5* <5*  CREATININE 0.71 0.65  CALCIUM 8.5* 8.9   PT/INR No results for input(s): LABPROT, INR in the last 72 hours. CMP     Component Value Date/Time   NA 135 10/02/2020 0124   K 3.7 10/02/2020 0124   CL 102 10/02/2020 0124   CO2 23 10/02/2020 0124   GLUCOSE 122 (H) 10/02/2020 0124   BUN <5 (L) 10/02/2020 0124   CREATININE 0.65 10/02/2020 0124   CALCIUM 8.9 10/02/2020 0124   PROT 6.2 (L) 09/25/2020 0228   ALBUMIN 2.9 (L) 09/25/2020 0228   AST 13 (L) 09/25/2020 0228   ALT 12 09/25/2020 0228   ALKPHOS 70 09/25/2020 0228   BILITOT 0.6 09/25/2020 0228   GFRNONAA >60 10/02/2020 0124   Lipase  No results found for:  LIPASE     Studies/Results: No results found.  Anti-infectives: Anti-infectives (From admission, onward)   Start     Dose/Rate Route Frequency Ordered Stop   09/29/20 0900  fluconazole (DIFLUCAN) IVPB 100 mg        100 mg 50 mL/hr over 60 Minutes Intravenous Every 24 hours 09/28/20 1648 10/01/20 0958   09/29/20 0900  fluconazole (DIFLUCAN) IVPB 100 mg  Status:  Discontinued        100 mg 50 mL/hr over 60 Minutes Intravenous Every 24 hours 09/28/20 1650 09/28/20 1651   09/27/20 1237  sodium chloride 0.9 % with cefoTEtan (CEFOTAN) ADS Med       Note to Pharmacy: Rocky Morel   : cabinet override      09/27/20 1237 09/27/20 1430   09/26/20 0600  cefoTEtan (CEFOTAN) 2 g in sodium chloride 0.9 % 100 mL IVPB  Status:  Discontinued        2 g 200 mL/hr over 30 Minutes Intravenous On call to O.R. 09/25/20 1412 09/27/20 0559   09/24/20 1600  fluconazole (DIFLUCAN) tablet 100 mg  Status:  Discontinued        100 mg Oral Daily 09/24/20 1552 09/28/20 1648       Assessment/Plan Hx of Seizures 4.4 cm ascending thoracic aortic aneurysm VP  shunt in place HLD DM - Hbg A1c 9.6 Elevated Tnon admission-Coronary CTA showed possible high grade lesion in LAD and distal RCA and coronary FFR now shows significant flow limiting lesions in the mid RCA and mid LAD. Per cardiologyhe is not a candidate for DAPT with PCIuntil colon resection complete.Would recommend proceeding with surgery. Need to avoid hypotension.Once cleared from a surgical standpoint for DAPT therapy, will plan cath and PCI. This can be done in outpt setting if he is stable postop CAD Severe atherosclerosis Esophageal plaques- noted on endoscopy, brushing taken by Dr. Collene Mares ABL anemia- hgb 10.2 this AM, stable  Moderate malnutrition - Prealbumin 16.2 (3/15)  Colon Mass - Colonoscopy showed a large malignant tumor in the proximal ascending colon close to the ICVthat wasbiopsied.This also showed a6 mm sessile polyp  in the sigmoid colonand a10 mm sessile polyp at the hepatic flexure that were retrieved with snare -Pathology: polyps benign, right colon mass adenocarcinoma - CT CAP showed5.0 x 1.4 x 3.7 cm eccentric intraluminal and transmural proximal colonic mass highly concerning for colon cancer, no evidence of obstruction; multiplesubcentimeter lymph nodes within the right lower quadrant mesentery - CEA12.4 S/P Laparoscopic assisted partial colectomy 09/27/20, Dr. Autumn Messing III  - POD#5 - path still pending - advance to soft diet - continue to mobilize - ok to resume home PO meds from a surgical standpoint - stable for discharge from a surgical standpoint when tolerating soft diet and having bowel function, I will start working on arranging follow up    FEN - soft diet, replace Mg VTE -SCDs, lovenox ID -diflucan 3/14-3/22   LOS: 11 days    Norm Parcel , [facility] 10/02/2020, 9:06 AM Please see Amion for pager number during day hours 7:00am-4:30pm

## 2020-10-02 NOTE — Progress Notes (Signed)
Per yesterday's Surgery note, the patient was taking clear liquids and candy but was having some loose stools.   When patient is again able to take PO meds to the extent that they will likely be fully absorbed, please call Neurology for assistance in transitioning back to his pre-hospital PO anticonvulsant regimen. He is currently on 3 IV anticonvulsants (Keppra, Vimpat and phenobarbital) and one PO (Lamictal).   Electronically signed: Dr. Kerney Elbe

## 2020-10-02 NOTE — Discharge Instructions (Signed)
[facility]      [facility] [phone]  OPEN ABDOMINAL SURGERY: POST OP INSTRUCTIONS  Always review your discharge instruction sheet given to you by the facility where your surgery was performed.  IF YOU HAVE DISABILITY OR FAMILY LEAVE FORMS, YOU MUST BRING THEM TO THE OFFICE FOR PROCESSING.  PLEASE DO NOT GIVE THEM TO YOUR DOCTOR.  1. A prescription for pain medication may be given to you upon discharge.  Take your pain medication as prescribed, if needed.  If narcotic pain medicine is not needed, then you may take acetaminophen (Tylenol) or ibuprofen (Advil) as needed. 2. Take your usually prescribed medications unless otherwise directed. 3. If you need a refill on your pain medication, please contact your pharmacy. They will contact our office to request authorization.  Prescriptions will not be filled after 5pm or on week-ends. 4. You should follow a light diet the first few days after arrival home, such as soup and crackers, pudding, etc.unless your doctor has advised otherwise. A high-fiber, low fat diet can be resumed as tolerated.   Be sure to include lots of fluids daily. Most patients will experience some swelling and bruising on the chest and neck area.  Ice packs will help.  Swelling and bruising can take several days to resolve 5. Most patients will experience some swelling and bruising in the area of the incision. Ice pack will help. Swelling and bruising can take several days to resolve..  6. It is common to experience some constipation if taking pain medication after surgery.  Increasing fluid intake and taking a stool softener will usually help or prevent this problem from occurring.  A mild laxative (Milk of Magnesia or Miralax) should be taken according to package directions if there are no bowel movements after 48 hours. 7.  You may have steri-strips (small skin tapes) in place directly over the incision.  These strips should be left on the skin for 7-10 days.  If your  surgeon used skin glue on the incision, you may shower in 24 hours.  The glue will flake off over the next 2-3 weeks.  Any sutures or staples will be removed at the office during your follow-up visit. You may find that a light gauze bandage over your incision may keep your staples from being rubbed or pulled. You may shower and replace the bandage daily. 8. ACTIVITIES:  You may resume regular (light) daily activities beginning the next day--such as daily self-care, walking, climbing stairs--gradually increasing activities as tolerated.  You may have sexual intercourse when it is comfortable.  Refrain from any heavy lifting or straining until approved by your doctor. a. You may drive when you no longer are taking prescription pain medication, you can comfortably wear a seatbelt, and you can safely maneuver your car and apply brakes  9. You should see your doctor in the office for a follow-up appointment approximately two weeks after your surgery.  Make sure that you call for this appointment within a day or two after you arrive home to insure a convenient appointment time.  WHEN TO CALL YOUR DOCTOR: 1. Fever over 101.0 2. Inability to urinate 3. Nausea and/or vomiting 4. Extreme swelling or bruising 5. Continued bleeding from incision. 6. Increased pain, redness, or drainage from the incision. 7. Difficulty swallowing or breathing 8. Muscle cramping or spasms. 9. Numbness or tingling in hands or feet or around lips.  The clinic staff is available to answer your questions during regular business hours.  Please   don't hesitate to call and ask to speak to one of the nurses if you have concerns.  For further questions, please visit [email or web address]   

## 2020-10-02 NOTE — Progress Notes (Addendum)
Pt was found on the floor lying on his left side. Pt didn't hit his head and no injuries were sustained. Pt states he is not in any pain. Post vitals were taken with BP at 159/100 and is now 157/96, MD has been notified and post fall assessment has been done. Post fall huddle was done with charge nurse. Pt was a standby assist prior to this and now has a fall risk band on and bed alarm is in place. Pt has been educated on the importance of his safety. Will continue to monitor.

## 2020-10-02 NOTE — Progress Notes (Signed)
Physical Therapy Treatment Patient Details Name: Jeff Kelley MRN: [id number] DOB: August 03, 1970 Today's Date: 10/02/2020    History of Present Illness 50 y.o. male with medical history significant for seizure disorder and anxiety, now presented 09/21/20 to the emergency department for evaluation of shortness of breath. Hgb 6.1; found new onset DM; 30 lb weight loss recently; Endoscopy showed a large malignant tumor in the proximal ascending colon; ST changes on EKG per cardiology "does not represent an acute coronary syndrome.  This represents demand ischemia in the setting of severe anemia." 3/17 partial colectomy    PT Comments    Patient reports his early morning fall was due to sleepiness and RW "got sideways on me." Extra time working on safety with RW (especially during transfers) and pt showed better judgement with walking distance and monitoring his level of fatigue during activity.     Follow Up Recommendations  Home health PT     Equipment Recommendations  Rolling walker with 5" wheels    Recommendations for Other Services OT consult     Precautions / Restrictions Precautions Precautions: Fall Precaution Comments: fall overnight    Mobility  Bed Mobility                    Transfers Overall transfer level: Needs assistance Equipment used: Rolling walker (2 wheeled) Transfers: Sit to/from Stand Sit to Stand: Min guard         General transfer comment: vc for use of RW x2 reps; remaining 3 reps did appropriately wihtout cue  Ambulation/Gait Ambulation/Gait assistance: Min guard Gait Distance (Feet): 80 Feet Assistive device: Rolling walker (2 wheeled) Gait Pattern/deviations: Step-through pattern;Decreased stride length;Shuffle Gait velocity: decr   General Gait Details: much better with keeping rW closer to body; more upright posture wiht more controlled velocity; showed better awareness of when to turn back based on fatigue   Stairs              Wheelchair Mobility    Modified Rankin (Stroke Patients Only)       Balance Overall balance assessment: Needs assistance   Sitting balance-Leahy Scale: Good     Standing balance support: No upper extremity supported Standing balance-Leahy Scale: Fair Standing balance comment: static standing wihtout UE support x 30 x sec                            Cognition Arousal/Alertness: Awake/alert Behavior During Therapy: Flat affect Overall Cognitive Status: No family/caregiver present to determine baseline cognitive functioning                                 General Comments: poor safety awareness (fell getting up alone)      Exercises      General Comments        Pertinent Vitals/Pain Pain Assessment: No/denies pain    Home Living                      Prior Function            PT Goals (current goals can now be found in the care plan section) Acute Rehab PT Goals Patient Stated Goal: be strong enought to go home in 2 days PT Goal Formulation: With patient Time For Goal Achievement: 10/15/20 Potential to Achieve Goals: Good Progress towards PT goals: Progressing toward goals    Frequency  Min 3X/week      PT Plan Current plan remains appropriate    Co-evaluation              AM-PAC PT "6 Clicks" Mobility   Outcome Measure  Help needed turning from your back to your side while in a flat bed without using bedrails?: A Little Help needed moving from lying on your back to sitting on the side of a flat bed without using bedrails?: A Little Help needed moving to and from a bed to a chair (including a wheelchair)?: A Little Help needed standing up from a chair using your arms (e.g., wheelchair or bedside chair)?: A Little Help needed to walk in hospital room?: A Little Help needed climbing 3-5 steps with a railing? : A Little 6 Click Score: 18    End of Session Equipment Utilized During Treatment: Gait  belt Activity Tolerance: Patient limited by fatigue Patient left: in chair;with call bell/phone within reach;with chair alarm set Nurse Communication: Mobility status;Other (comment) (added chair alarm) PT Visit Diagnosis: Difficulty in walking, not elsewhere classified (R26.2);Muscle weakness (generalized) (M62.81)     Time: 3524-8185 PT Time Calculation (min) (ACUTE ONLY): 23 min  Charges:  $Gait Training: 23-37 mins                      Arby Barrette, PT Pager [phone]    Rexanne Mano 10/02/2020, 10:07 AM

## 2020-10-03 DIAGNOSIS — C186 Malignant neoplasm of descending colon: Secondary | ICD-10-CM

## 2020-10-03 DIAGNOSIS — R197 Diarrhea, unspecified: Secondary | ICD-10-CM

## 2020-10-03 LAB — GLUCOSE, CAPILLARY
Glucose-Capillary: 101 mg/dL — ABNORMAL HIGH (ref 70–99)
Glucose-Capillary: 119 mg/dL — ABNORMAL HIGH (ref 70–99)
Glucose-Capillary: 121 mg/dL — ABNORMAL HIGH (ref 70–99)
Glucose-Capillary: 94 mg/dL (ref 70–99)
Glucose-Capillary: 144 mg/dL — ABNORMAL HIGH (ref 70–99)
Glucose-Capillary: 127 mg/dL — ABNORMAL HIGH (ref 70–99)

## 2020-10-03 LAB — CBC
HCT: 36.5 % — ABNORMAL LOW (ref 39.0–52.0)
Hemoglobin: 10.9 g/dL — ABNORMAL LOW (ref 13.0–17.0)
MCH: 20.3 pg — ABNORMAL LOW (ref 26.0–34.0)
MCHC: 29.9 g/dL — ABNORMAL LOW (ref 30.0–36.0)
MCV: 68 fL — ABNORMAL LOW (ref 80.0–100.0)
Platelets: 743 10*3/uL — ABNORMAL HIGH (ref 150–400)
RBC: 5.37 MIL/uL (ref 4.22–5.81)
WBC: 12.4 10*3/uL — ABNORMAL HIGH (ref 4.0–10.5)
nRBC: 0 % (ref 0.0–0.2)

## 2020-10-03 LAB — BASIC METABOLIC PANEL
Anion gap: 10 (ref 5–15)
BUN: <5 mg/dL — ABNORMAL LOW (ref 6–20)
CO2: 25 mmol/L (ref 22–32)
Calcium: 8.9 mg/dL (ref 8.9–10.3)
Chloride: 102 mmol/L (ref 98–111)
Creatinine, Ser: 0.65 mg/dL (ref 0.61–1.24)
GFR, Estimated: >60 mL/min (ref >60)
Glucose, Bld: 97 mg/dL (ref 70–99)
Potassium: 3.8 mmol/L (ref 3.5–5.1)
Sodium: 137 mmol/L (ref 135–145)

## 2020-10-03 LAB — MAGNESIUM: Magnesium: 1.8 mg/dL (ref 1.7–2.4)

## 2020-10-03 MED ORDER — PSYLLIUM 95 % PO PACK
1.0000 | PACK | Freq: Every day | ORAL | Status: DC
  Administered 2020-10-03 – 2020-10-05 (×3): 1 via ORAL
  Filled 2020-10-03 (×3): qty 1

## 2020-10-03 NOTE — Progress Notes (Signed)
PT Cancellation Note  Patient Details Name: DEMARRIUS GUERRERO MRN: [id number] DOB: 1971-07-04   Cancelled Treatment:    Reason Eval/Treat Not Completed: Medical issues which prohibited therapy  Patient reports frequent diarrhea today. Does not want to "risk" walking and has been doing lots of standing due to transfers on/off BSC.    Arby Barrette, PT Pager [phone]   Rexanne Mano 10/03/2020, 3:54 PM

## 2020-10-03 NOTE — Progress Notes (Signed)
Patient ID: Jeff Kelley, male   DOB: Feb 19, 1971, 50 y.o.   MRN: [id number] 6 Days Post-Op   Subjective: Up in chair Moving bowels Wants cereal ROS negative except as listed above. Objective: Vital signs in last 24 hours: Temp:  [98.1 F (36.7 C)-99.1 F (37.3 C)] 98.4 F (36.9 C) (03/23 0516) Pulse Rate:  [82-106] 90 (03/23 0516) Resp:  [18-20] 20 (03/23 0516) BP: (106-159)/(71-98) 159/97 (03/23 0516) SpO2:  [96 %-99 %] 99 % (03/23 0516) Weight:  [90.3 kg] 90.3 kg (03/23 0535) Last BM Date: 10/02/20  Intake/Output from previous day: 03/22 0701 - 03/23 0700 In: -  Out: 350 [Urine:350] Intake/Output this shift: No intake/output data recorded.  GI: soft, NT, dressing CDI  Lab Results: CBC  Recent Labs    10/02/20 0124 10/03/20 0251  WBC 8.7 12.4*  HGB 10.2* 10.9*  HCT 34.6* 36.5*  PLT 651* 743*   BMET Recent Labs    10/02/20 0124 10/03/20 0251  NA 135 137  K 3.7 3.8  CL 102 102  CO2 23 25  GLUCOSE 122* 97  BUN <5* <5*  CREATININE 0.65 0.65  CALCIUM 8.9 8.9   PT/INR No results for input(s): LABPROT, INR in the last 72 hours. ABG No results for input(s): PHART, HCO3 in the last 72 hours.  Invalid input(s): PCO2, PO2  Studies/Results: No results found.  Anti-infectives: Anti-infectives (From admission, onward)   Start     Dose/Rate Route Frequency Ordered Stop   09/29/20 0900  fluconazole (DIFLUCAN) IVPB 100 mg        100 mg 50 mL/hr over 60 Minutes Intravenous Every 24 hours 09/28/20 1648 10/01/20 0958   09/29/20 0900  fluconazole (DIFLUCAN) IVPB 100 mg  Status:  Discontinued        100 mg 50 mL/hr over 60 Minutes Intravenous Every 24 hours 09/28/20 1650 09/28/20 1651   09/27/20 1237  sodium chloride 0.9 % with cefoTEtan (CEFOTAN) ADS Med       Note to Pharmacy: Rocky Morel   : cabinet override      09/27/20 1237 09/27/20 1430   09/26/20 0600  cefoTEtan (CEFOTAN) 2 g in sodium chloride 0.9 % 100 mL IVPB  Status:  Discontinued        2  g 200 mL/hr over 30 Minutes Intravenous On call to O.R. 09/25/20 1412 09/27/20 0559   09/24/20 1600  fluconazole (DIFLUCAN) tablet 100 mg  Status:  Discontinued        100 mg Oral Daily 09/24/20 1552 09/28/20 1648      Assessment/Plan: Hx of Seizures 4.4 cm ascending thoracic aortic aneurysm VP shunt in place HLD DM - Hbg A1c 9.6 Elevated Tnon admission-Coronary CTA showed possible high grade lesion in LAD and distal RCA and coronary FFR now shows significant flow limiting lesions in the mid RCA and mid LAD. Per cardiologyhe is not a candidate for DAPT with PCIuntil colon resection complete. This is complete. Severe atherosclerosis Esophageal plaques- noted on endoscopy, brushing taken by Dr. Collene Mares ABL anemia- hgb 10.2 this AM, stable  Moderate malnutrition - Prealbumin 16.2 (3/15)  Colon Mass - Colonoscopy showed a large malignant tumor in the proximal ascending colon close to the ICVthat wasbiopsied.This also showed a6 mm sessile polyp in the sigmoid colonand a10 mm sessile polyp at the hepatic flexure that were retrieved with snare -Pathology: polyps benign, right colon mass adenocarcinoma - CT CAP showed5.0 x 1.4 x 3.7 cm eccentric intraluminal and transmural proximal colonic mass highly concerning for  colon cancer, no evidence of obstruction; multiplesubcentimeter lymph nodes within the right lower quadrant mesentery - CEA12.4 S/P Laparoscopic assisted partial colectomy 09/27/20, Dr. Autumn Messing III  - path still pending - advance to reg diet - continue to mobilize - ok to resume home PO meds from a surgical standpoint - stable for discharge from a surgical standpoint when tolerating soft diet and having bowel function D/C dressing tomorrow (he declined removal today)   FEN - soft diet, replace Mg VTE -SCDs, lovenox ID -diflucan 3/14-3/22   LOS: 12 days    Georganna Skeans, MD, MPH, FACS Trauma & General Surgery Use [email or web address] to contact on call  provider  10/03/2020

## 2020-10-03 NOTE — Progress Notes (Addendum)
[facility] PROGRESS NOTE    Progress Note  SHEILA GERVASI  CVE:[id number] DOB: 16-Dec-1970 DOA: 09/21/2020 PCP: Patient, No Pcp Per     Brief Narrative:   Jeff Kelley is an 50 y.o. male past medical history of seizure disorder and anxiety presents to the emergency room for shortness of breath started about 1 month prior to admission, he was noted to have a hemoglobin of 6.1 and transfuse 1 unit of packed red blood cells underwent EGD and colonoscopy on 09/24/2020 that showed candidiasis and large colonic mass biopsy showed adenocarcinoma, CEA was elevated surgery was consulted and he is status post partial colectomy on 09/28/2019.  Oncology was consulted for staging.  Assessment/Plan:   Newly diagnosed adenocarcinoma/symptomatic iron deficiency anemia/status post partial colectomy on 3/17: Colonoscopy showed multiple sessile polyps with a large malignant adenocarcinoma status post partial colectomy. In hospital work-up has been completed as a follow-up with Dr. Benay Spice in 3 months. Diet per surgery. Still no soft diet, surgery recommended to switch to p.o. meds from a surgical standpoint.  Esophageal candidiasis: Completed 7-day course of Diflucan on 09/30/2020.  Newly diagnosed ascending aortic aneurysm measuring 4.4 cm: Patient made aware, will need to follow-up imaging as an outpatient in 6 months to a year.  Severe symptomatic iron deficiency anemia: Likely due to large malignant colonic mass. Status post 2 units of packed red blood cells continue to monitor hemoglobin closely. Hemoglobin is slowly trending up.  Elevated troponin likely due to demand ischemia in the setting of severe anemia: The echo done showed no wall motion abnormality EF of 02% grade 2 diastolic heart failure. High resolution coronary CT show high FFR seen by cardiologist recommended left heart cath at some point in the future. Cardiology recommended continuing aspirin and Lipitor.  Diabetes  mellitus type 2 uncontrolled: With an A1c of 9.6 continue sliding scale insulin. Continue CBGs before meals and at bedtime.  Seizure disorder: There is a concern about a complete obstruction of his small bowel. Seen by neurology who recommended IV Keppra Vimpat phenobarbital and p.o. Lamictal. Neurology wants to wait before completing transitioning to oral.  New watery diarrhea: With 1-2 bowel movement of green watery stools. Some nausea but no vomiting.  He denies any abdominal pain physical exam is benign. We will start on Metamucil.  Mild leucytosis with a rising platelet count.  Creatinine seems to be at baseline. Check a BMP bed and a CBC tomorrow morning.   DVT prophylaxis: lovenxo Family Communication:none Status is: Inpatient  Remains inpatient appropriate because:Hemodynamically unstable   Dispo:  Patient From: Home  Planned Disposition: Home  Medically stable for discharge: No     Code Status:     Code Status Orders  (From admission, onward)         Start     Ordered   09/22/20 0020  Full code  Continuous        09/22/20 0021        Code Status History    This patient has a current code status but no historical code status.   Advance Care Planning Activity        IV Access:    Peripheral IV   Procedures and diagnostic studies:   No results found.   Medical Consultants:    None.   Subjective:    TENNYSON WACHA relates he continues to have 1-2 watery bowel movements which are green in color no abdominal pain.  Has not been throwing up some nausea  with eating  Objective:    Vitals:   10/02/20 1215 10/02/20 2021 10/03/20 0516 10/03/20 0535  BP: 106/71 (!) 151/98 (!) 159/97   Pulse: 82 (!) 106 90   Resp: 18 20 20    Temp: 98.1 F (36.7 C) 99.1 F (37.3 C) 98.4 F (36.9 C)   TempSrc:  Oral Oral   SpO2: 96% 98% 99%   Weight:    90.3 kg  Height:       SpO2: 99 % O2 Flow Rate (L/min): 2 L/min   Intake/Output Summary (Last  24 hours) at 10/03/2020 0728 Last data filed at 10/03/2020 0153 Gross per 24 hour  Intake --  Output 350 ml  Net -350 ml   Filed Weights   09/27/20 1241 10/02/20 0500 10/03/20 0535  Weight: 87.4 kg 90.1 kg 90.3 kg    Exam: General exam: In no acute distress. Respiratory system: Good air movement and clear to auscultation. Cardiovascular system: S1 & S2 heard, RRR.  Gastrointestinal system: Abdomen is nondistended, soft and nontender.  Extremities: No pedal edema. Skin: No rashes, lesions or ulcers Psychiatry: Judgement and insight appear normal. Mood & affect appropriate.    Data Reviewed:    Labs: Basic Metabolic Panel: Recent Labs  Lab 09/29/20 0152 09/30/20 0132 10/01/20 0144 10/02/20 0124 10/03/20 0251  NA 130* 132* 135 135 137  K 3.5 3.9 3.7 3.7 3.8  CL 96* 100 103 102 102  CO2 22 18* 22 23 25   GLUCOSE 109* 95 92 122* 97  BUN 9 8 <5* <5* <5*  CREATININE 0.83 0.76 0.71 0.65 0.65  CALCIUM 8.9 8.5* 8.5* 8.9 8.9  MG  --  1.9 1.7 1.6* 1.8   GFR Estimated Creatinine Clearance: 116.3 mL/min (by C-G formula based on SCr of 0.65 mg/dL). Liver Function Tests: No results for input(s): AST, ALT, ALKPHOS, BILITOT, PROT, ALBUMIN in the last 168 hours. No results for input(s): LIPASE, AMYLASE in the last 168 hours. No results for input(s): AMMONIA in the last 168 hours. Coagulation profile No results for input(s): INR, PROTIME in the last 168 hours. COVID-19 Labs  No results for input(s): DDIMER, FERRITIN, LDH, CRP in the last 72 hours.  Lab Results  Component Value Date   [place] NEGATIVE 09/21/2020    CBC: Recent Labs  Lab 09/29/20 0152 09/30/20 0132 10/01/20 0144 10/02/20 0124 10/03/20 0251  WBC 15.3* 12.1* 8.4 8.7 12.4*  HGB 10.0* 9.6* 9.7* 10.2* 10.9*  HCT 32.8* 32.0* 31.9* 34.6* 36.5*  MCV 65.6* 66.5* 66.3* 66.8* 68.0*  PLT 464* 470* 506* 651* 743*   Cardiac Enzymes: No results for input(s): CKTOTAL, CKMB, CKMBINDEX, TROPONINI in the last 168  hours. BNP (last 3 results) No results for input(s): PROBNP in the last 8760 hours. CBG: Recent Labs  Lab 10/02/20 1217 10/02/20 1613 10/02/20 2024 10/02/20 2304 10/03/20 0306  GLUCAP 143* 130* 205* 123* 101*   D-Dimer: No results for input(s): DDIMER in the last 72 hours. Hgb A1c: No results for input(s): HGBA1C in the last 72 hours. Lipid Profile: No results for input(s): CHOL, HDL, LDLCALC, TRIG, CHOLHDL, LDLDIRECT in the last 72 hours. Thyroid function studies: No results for input(s): TSH, T4TOTAL, T3FREE, THYROIDAB in the last 72 hours.  Invalid input(s): FREET3 Anemia work up: No results for input(s): VITAMINB12, FOLATE, FERRITIN, TIBC, IRON, RETICCTPCT in the last 72 hours. Sepsis Labs: Recent Labs  Lab 09/30/20 0132 10/01/20 0144 10/02/20 0124 10/03/20 0251  WBC 12.1* 8.4 8.7 12.4*   Microbiology No results found for  this or any previous visit (from the past 240 hour(s)).   Medications:   . acetaminophen  1,000 mg Oral Q8H  . aspirin EC  81 mg Oral Daily  . atorvastatin  80 mg Oral Daily  . enoxaparin (LOVENOX) injection  40 mg Subcutaneous Q24H  . feeding supplement  1 Container Oral TID BM  . ferrous sulfate  325 mg Oral Q breakfast  . insulin aspart  0-6 Units Subcutaneous Q4H  . lamoTRIgine  400 mg Oral BID  . LORazepam  1 mg Intravenous Q12H  . metoprolol tartrate  25 mg Oral BID  . pantoprazole (PROTONIX) IV  40 mg Intravenous Q24H  . PHENObarbital  65 mg Intravenous Q12H  . potassium chloride  40 mEq Oral Daily   Continuous Infusions: . sodium chloride    . sodium chloride    . 0.9 % NaCl with KCl 20 mEq / L 75 mL/hr at 10/03/20 0245  . lacosamide (VIMPAT) IV 100 mg (10/02/20 2151)  . levETIRAcetam 1,500 mg (10/02/20 2316)  . methocarbamol (ROBAXIN) IV        LOS: 12 days   Charlynne Cousins  [facility]  10/03/2020, 7:28 AM

## 2020-10-03 NOTE — TOC Initial Note (Addendum)
Transition of Care [facility]) - Initial/Assessment Note    Patient Details  Name: Jeff Kelley MRN: [id number] Date of Birth: 03/25/1971  [facility] CM/SW Contact:    Angelita Ingles, RN Phone Number: [phone]  10/03/2020, 2:22 PM  Clinical Narrative:                 [facility] consulted fro DME and HH needs. DME rolling walker has been ordered through [facility] and will be delivered to the room. HH pending , will wait for OT to evaluate patient and give recommendations. TOC will continue to follow.   Home Health Has been set up through [facility] Agency to contact patient to set up start of care  Expected Discharge Plan: [place] Barriers to Discharge: Continued Medical Work up   Patient Goals and CMS Choice Patient states their goals for this hospitalization and ongoing recovery are:: Readyh to go home CMS Medicare.gov Compare Post Acute Care list provided to:: Patient Choice offered to / list presented to : Patient  Expected Discharge Plan and Services Expected Discharge Plan: [place] In-house Referral: NA Discharge Planning Services: CM Consult Post Acute Care Choice: Durable Medical Equipment Living arrangements for the past 2 months: Single Family Home                 DME Arranged: Walker rolling DME Agency: [employer] Date DME Agency Contacted: 10/03/20 Time DME Agency Contacted: 48 Representative spoke with at DME Agency: [place] Arrangements/Services Living arrangements for the past 2 months: [place] with:: Self Patient language and need for interpreter reviewed:: Yes Do you feel safe going back to the place where you live?: Yes      Need for Family Participation in Patient Care: Yes (Comment) Care giver support system in place?: Yes (comment) Current home services:  (n/a) Criminal Activity/Legal Involvement Pertinent to Current Situation/Hospitalization: No - Comment  as needed  Activities of Daily Living Home Assistive Devices/Equipment: None ADL Screening (condition at time of admission) Patient's cognitive ability adequate to safely complete daily activities?: Yes Is the patient deaf or have difficulty hearing?: No Does the patient have difficulty seeing, even when wearing glasses/contacts?: No Does the patient have difficulty concentrating, remembering, or making decisions?: No Patient able to express need for assistance with ADLs?: Yes Does the patient have difficulty dressing or bathing?: No Independently performs ADLs?: Yes (appropriate for developmental age) Does the patient have difficulty walking or climbing stairs?: No Weakness of Legs: None Weakness of Arms/Hands: None  Permission Sought/Granted   Permission granted to share information with : No              Emotional Assessment Appearance:: Appears stated age Attitude/Demeanor/Rapport: Gracious Affect (typically observed): Pleasant Orientation: : Oriented to Self,Oriented to Place,Oriented to Situation,Oriented to  Time Alcohol / Substance Use: Not Applicable Psych Involvement: No (comment)  Admission diagnosis:  SOB (shortness of breath) [R06.02] Symptomatic anemia [D64.9] Patient Active Problem List   Diagnosis Date Noted  . Abnormal echocardiogram 09/23/2020  . Seizures ([place])   . Occult GI bleeding   . Elevated troponin   . CHF (congestive heart failure) ([place])   . Hyperglycemia   . Iron deficiency anemia   . Hyponatremia   . Symptomatic anemia 09/21/2020   PCP:  Patient, No Pcp Per Pharmacy:   [id number] - [place], [place] - [address]  [place] [address] Phone: [phone] Fax: [phone]     Social Determinants of Health (SDOH) Interventions    Readmission Risk Interventions No flowsheet data found.

## 2020-10-03 NOTE — Evaluation (Signed)
Occupational Therapy Evaluation Patient Details Name: Jeff Kelley MRN: [id number] DOB: 09/16/1970 Today's Date: 10/03/2020    History of Present Illness 50 y.o. male with medical history significant for seizure disorder and anxiety, now presented 09/21/20 to the emergency department for evaluation of shortness of breath. Hgb 6.1; found new onset DM; 30 lb weight loss recently; Endoscopy showed a large malignant tumor in the proximal ascending colon; ST changes on EKG per cardiology "does not represent an acute coronary syndrome.  This represents demand ischemia in the setting of severe anemia." 3/17 partial colectomy   Clinical Impression   Pt PTA: Pt living with spouse and reports independence. Pt currently s/p fall in hospital room feeling drowsy and up without assist. Pt reports that he had been up and moving without assist for days. Pt currently, minA overall for ADL and mobility with minguardA and RW. Pt appears to be cautious and aware of obstacles. New goals created. Pt limited by decreased strength and increased need for assist for LB ADL due to colectomy. Pt with pain at incision cite. Pt would benefit from continued OT skilled services. OT following acutely.     Follow Up Recommendations  Home health OT;Supervision - Intermittent (Pt discussed that he does not want HHOT at this time. I discussed the benefit from OT eval and he seemed open minded.)    Equipment Recommendations  None recommended by OT    Recommendations for Other Services       Precautions / Restrictions Precautions Precautions: Fall Precaution Comments: fall 3/22 Restrictions Weight Bearing Restrictions: No      Mobility Bed Mobility               General bed mobility comments: in recliner pre and post session    Transfers Overall transfer level: Needs assistance Equipment used: Rolling walker (2 wheeled) Transfers: Sit to/from Stand Sit to Stand: Min guard         General transfer  comment: cues for hand placement    Balance Overall balance assessment: Needs assistance   Sitting balance-Leahy Scale: Good     Standing balance support: No upper extremity supported Standing balance-Leahy Scale: Fair Standing balance comment: standing at sink for grooming; fatigues easily from pain                           ADL either performed or assessed with clinical judgement   ADL Overall ADL's : Needs assistance/impaired Eating/Feeding: Modified independent;Sitting   Grooming: Set up;Sitting Grooming Details (indicate cue type and reason): sitting due to pain Upper Body Bathing: Set up;Sitting   Lower Body Bathing: Cueing for safety;Cueing for sequencing;Sitting/lateral leans;Sit to/from stand;Min guard   Upper Body Dressing : Set up;Sitting   Lower Body Dressing: Min guard;Cueing for safety;Cueing for sequencing;Sitting/lateral leans;Sit to/from stand   Toilet Transfer: Min guard;Cueing for safety;Ambulation;RW;Regular Toilet   Toileting- Clothing Manipulation and Hygiene: Minimal assistance;Cueing for safety;Sitting/lateral lean;Sit to/from stand       Functional mobility during ADLs: Min guard;Cueing for safety General ADL Comments: Pt limited by decreased strength and increased need for assist for LB ADL due to colectomy. Pt with pain at incision cite.     Vision   Vision Assessment?: No apparent visual deficits     Perception     Praxis      Pertinent Vitals/Pain Pain Assessment: 0-10 Pain Score: 2  Pain Location: stomach Pain Descriptors / Indicators: Discomfort Pain Intervention(s): Monitored during session  Hand Dominance     Extremity/Trunk Assessment Upper Extremity Assessment Upper Extremity Assessment: Generalized weakness (R residual weakness)   Lower Extremity Assessment Lower Extremity Assessment: Generalized weakness       Communication     Cognition Arousal/Alertness: Awake/alert Behavior During Therapy: Flat  affect Overall Cognitive Status: No family/caregiver present to determine baseline cognitive functioning                                 General Comments: Pt aware of alarms and need to call for assistance. Pt stating "I can't move without them knowing. You better turn that thing off." Pt aware of chair alarm and need to use RW   General Comments  VSS on RA.    Exercises     Shoulder Instructions      Home Living                                          Prior Functioning/Environment                   OT Problem List:        OT Treatment/Interventions:      OT Goals(Current goals can be found in the care plan section) Acute Rehab OT Goals Patient Stated Goal: be strong enought to go home in 2 days ADL Goals Pt Will Perform Lower Body Dressing: with supervision;sitting/lateral leans;sit to/from stand;with adaptive equipment Additional ADL Goal #1: Pt will perform OOB ADL with modified independence with no cues for safety.  OT Frequency: Min 2X/week   Barriers to D/C:            Co-evaluation              AM-PAC OT "6 Clicks" Daily Activity     Outcome Measure Help from another person eating meals?: None Help from another person taking care of personal grooming?: A Little Help from another person toileting, which includes using toliet, bedpan, or urinal?: A Little Help from another person bathing (including washing, rinsing, drying)?: A Little Help from another person to put on and taking off regular upper body clothing?: None Help from another person to put on and taking off regular lower body clothing?: A Little 6 Click Score: 20   End of Session Equipment Utilized During Treatment: Gait belt;Rolling walker Nurse Communication: Mobility status  Activity Tolerance: Patient limited by fatigue;Patient limited by pain Patient left: in chair;with call bell/phone within reach;with chair alarm set  OT Visit Diagnosis:  Unsteadiness on feet (R26.81);Muscle weakness (generalized) (M62.81);Pain Pain - part of body:  (stomach)                Time: 7793-9030 OT Time Calculation (min): 23 min Charges:  OT General Charges $OT Visit: 1 Visit OT Evaluation $OT Eval Moderate Complexity: 1 Mod OT Treatments $Self Care/Home Management : 8-22 mins  Jefferey Pica, OTR/L Acute Rehabilitation Services Pager: [phone] Office: [phone]   Topanga Alvelo C 10/03/2020, 5:29 PM

## 2020-10-04 LAB — GLUCOSE, CAPILLARY
Glucose-Capillary: 108 mg/dL — ABNORMAL HIGH (ref 70–99)
Glucose-Capillary: 123 mg/dL — ABNORMAL HIGH (ref 70–99)
Glucose-Capillary: 129 mg/dL — ABNORMAL HIGH (ref 70–99)
Glucose-Capillary: 144 mg/dL — ABNORMAL HIGH (ref 70–99)
Glucose-Capillary: 87 mg/dL (ref 70–99)
Glucose-Capillary: 96 mg/dL (ref 70–99)

## 2020-10-04 LAB — PATHOLOGIST SMEAR REVIEW

## 2020-10-04 LAB — BASIC METABOLIC PANEL
Anion gap: 11 (ref 5–15)
BUN: 5 mg/dL — ABNORMAL LOW (ref 6–20)
CO2: 25 mmol/L (ref 22–32)
Calcium: 9.2 mg/dL (ref 8.9–10.3)
Chloride: 99 mmol/L (ref 98–111)
Creatinine, Ser: 0.66 mg/dL (ref 0.61–1.24)
GFR, Estimated: >60 mL/min (ref >60)
Glucose, Bld: 104 mg/dL — ABNORMAL HIGH (ref 70–99)
Potassium: 3.8 mmol/L (ref 3.5–5.1)
Sodium: 135 mmol/L (ref 135–145)

## 2020-10-04 LAB — CBC
HCT: 37.6 % — ABNORMAL LOW (ref 39.0–52.0)
Hemoglobin: 11.2 g/dL — ABNORMAL LOW (ref 13.0–17.0)
MCH: 20.5 pg — ABNORMAL LOW (ref 26.0–34.0)
MCHC: 29.8 g/dL — ABNORMAL LOW (ref 30.0–36.0)
MCV: 68.7 fL — ABNORMAL LOW (ref 80.0–100.0)
Platelets: 972 10*3/uL (ref 150–400)
RBC: 5.47 MIL/uL (ref 4.22–5.81)
WBC: 12 10*3/uL — ABNORMAL HIGH (ref 4.0–10.5)
nRBC: 0 % (ref 0.0–0.2)

## 2020-10-04 LAB — SURGICAL PATHOLOGY

## 2020-10-04 LAB — MAGNESIUM: Magnesium: 1.8 mg/dL (ref 1.7–2.4)

## 2020-10-04 MED ORDER — LORAZEPAM 1 MG PO TABS: 1.0000 mg | ORAL_TABLET | Freq: Two times a day (BID) | ORAL | Status: DC | PRN

## 2020-10-04 MED ORDER — PHENOBARBITAL 32.4 MG PO TABS
64.8000 mg | ORAL_TABLET | Freq: Two times a day (BID) | ORAL | Status: DC
Start: 2020-10-04 — End: 2020-10-05
  Administered 2020-10-04 – 2020-10-05 (×3): 64.8 mg via ORAL
  Filled 2020-10-04 (×3): qty 2

## 2020-10-04 MED ORDER — LOPERAMIDE HCL 2 MG PO CAPS
4.0000 mg | ORAL_CAPSULE | ORAL | Status: DC | PRN
  Administered 2020-10-04 (×2): 4 mg via ORAL
  Filled 2020-10-04 (×3): qty 2

## 2020-10-04 MED ORDER — LEVETIRACETAM 100 MG/ML PO SOLN
1125.0000 mg | Freq: Two times a day (BID) | ORAL | Status: DC
  Administered 2020-10-04 – 2020-10-05 (×3): 1125 mg via ORAL
  Filled 2020-10-04 (×4): qty 15

## 2020-10-04 MED ORDER — LACOSAMIDE 50 MG PO TABS
100.0000 mg | ORAL_TABLET | Freq: Two times a day (BID) | ORAL | Status: DC
  Administered 2020-10-04 – 2020-10-05 (×3): 100 mg via ORAL
  Filled 2020-10-04 (×3): qty 2

## 2020-10-04 MED ORDER — FERROUS SULFATE 325 (65 FE) MG PO TABS
325.0000 mg | ORAL_TABLET | Freq: Three times a day (TID) | ORAL | Status: DC
  Administered 2020-10-04 – 2020-10-05 (×5): 325 mg via ORAL
  Filled 2020-10-04 (×5): qty 1

## 2020-10-04 NOTE — Progress Notes (Signed)
Date and time results received: 10/04/20 5:57 AM  Test: Platelets  Critical Value: 972  Name of Provider Notified: Opyd

## 2020-10-04 NOTE — Progress Notes (Signed)
Patient ID: Jeff Kelley, male   DOB: 1971-02-10, 50 y.o.   MRN: [id number] 7 Days Post-Op   Subjective: Up in chair Diarrhea overnight ROS negative except as listed above. Objective: Vital signs in last 24 hours: Temp:  [98.4 F (36.9 C)-98.8 F (37.1 C)] 98.8 F (37.1 C) (03/24 0630) Pulse Rate:  [87-98] 87 (03/24 0630) Resp:  [18-20] 19 (03/24 0630) BP: (127-145)/(85-95) 130/85 (03/24 0630) SpO2:  [97 %-98 %] 98 % (03/24 0630) Weight:  [90.3 kg] 90.3 kg (03/24 0643) Last BM Date: 10/03/20  Intake/Output from previous day: 03/23 0701 - 03/24 0700 In: -  Out: 800 [Urine:800] Intake/Output this shift: No intake/output data recorded.  General appearance: cooperative Resp: clear to auscultation bilaterally Cardio: regular rate and rhythm GI: soft, incisions CDI with staples Extremities: claves soft  Lab Results: CBC  Recent Labs    10/03/20 0251 10/04/20 0318  WBC 12.4* 12.0*  HGB 10.9* 11.2*  HCT 36.5* 37.6*  PLT 743* 972*   BMET Recent Labs    10/03/20 0251 10/04/20 0318  NA 137 135  K 3.8 3.8  CL 102 99  CO2 25 25  GLUCOSE 97 104*  BUN <5* 5*  CREATININE 0.65 0.66  CALCIUM 8.9 9.2   PT/INR No results for input(s): LABPROT, INR in the last 72 hours. ABG No results for input(s): PHART, HCO3 in the last 72 hours.  Invalid input(s): PCO2, PO2  Studies/Results: No results found.  Anti-infectives: Anti-infectives (From admission, onward)   Start     Dose/Rate Route Frequency Ordered Stop   09/29/20 0900  fluconazole (DIFLUCAN) IVPB 100 mg        100 mg 50 mL/hr over 60 Minutes Intravenous Every 24 hours 09/28/20 1648 10/01/20 0958   09/29/20 0900  fluconazole (DIFLUCAN) IVPB 100 mg  Status:  Discontinued        100 mg 50 mL/hr over 60 Minutes Intravenous Every 24 hours 09/28/20 1650 09/28/20 1651   09/27/20 1237  sodium chloride 0.9 % with cefoTEtan (CEFOTAN) ADS Med       Note to Pharmacy: Rocky Morel   : cabinet override      09/27/20  1237 09/27/20 1430   09/26/20 0600  cefoTEtan (CEFOTAN) 2 g in sodium chloride 0.9 % 100 mL IVPB  Status:  Discontinued        2 g 200 mL/hr over 30 Minutes Intravenous On call to O.R. 09/25/20 1412 09/27/20 0559   09/24/20 1600  fluconazole (DIFLUCAN) tablet 100 mg  Status:  Discontinued        100 mg Oral Daily 09/24/20 1552 09/28/20 1648      Assessment/Plan: Hx of Seizures 4.4 cm ascending thoracic aortic aneurysm VP shunt in place HLD DM - Hbg A1c 9.6 Elevated Tnon admission-Coronary CTA showed possible high grade lesion in LAD and distal RCA and coronary FFR now shows significant flow limiting lesions in the mid RCA and mid LAD. Per cardiologyhe is not a candidate for DAPT with PCIuntil colon resection complete. This is complete. Severe atherosclerosis Esophageal plaques- noted on endoscopy, brushing taken by Dr. Collene Mares ABL anemia Moderate malnutrition - Prealbumin 16.2 (3/15)  Colon Mass - Colonoscopy showed a large malignant tumor in the proximal ascending colon close to the ICVthat wasbiopsied.This also showed a6 mm sessile polyp in the sigmoid colonand a10 mm sessile polyp at the hepatic flexure that were retrieved with snare -Pathology: polyps benign, right colon mass adenocarcinoma - CT CAP showed5.0 x 1.4 x 3.7 cm eccentric  intraluminal and transmural proximal colonic mass highly concerning for colon cancer, no evidence of obstruction; multiplesubcentimeter lymph nodes within the right lower quadrant mesentery - CEA12.4 S/P Laparoscopic assisted partial colectomy 09/27/20, Dr. Autumn Messing III  - path still pending - reg diet - ok to resume home PO meds from a surgical standpoint - stable for discharge from a surgical standpoint  LOS: 13 days    Georganna Skeans, MD, MPH, FACS Trauma & General Surgery Use [email or web address] to contact on call provider  10/04/2020

## 2020-10-04 NOTE — Plan of Care (Signed)
  Problem: Education: Goal: Ability to demonstrate management of disease process will improve Outcome: Progressing Goal: Ability to verbalize understanding of medication therapies will improve Outcome: Progressing   

## 2020-10-04 NOTE — Progress Notes (Signed)
Physical Therapy Treatment Patient Details Name: Jeff Kelley MRN: [id number] DOB: 1970-08-08 Today's Date: 10/04/2020    History of Present Illness 50 y.o. male with medical history significant for seizure disorder and anxiety, now presented 09/21/20 to the emergency department for evaluation of shortness of breath. Hgb 6.1; found new onset DM; 30 lb weight loss recently; Endoscopy showed a large malignant tumor in the proximal ascending colon; ST changes on EKG per cardiology "does not represent an acute coronary syndrome.  This represents demand ischemia in the setting of severe anemia." 3/17 partial colectomy    PT Comments    Pt continues to make steady progress. Eager to return home.    Follow Up Recommendations  Home health PT     Equipment Recommendations  Rolling walker with 5" wheels    Recommendations for Other Services       Precautions / Restrictions Precautions Precautions: Fall Precaution Comments: fall 3/22    Mobility  Bed Mobility               General bed mobility comments: Pt up in chair    Transfers Overall transfer level: Needs assistance Equipment used: Rolling walker (2 wheeled) Transfers: Sit to/from Stand Sit to Stand: Supervision         General transfer comment: supervision for safety  Ambulation/Gait Ambulation/Gait assistance: Supervision Gait Distance (Feet): 150 Feet Assistive device: Rolling walker (2 wheeled) Gait Pattern/deviations: Step-through pattern;Decreased stride length Gait velocity: decr Gait velocity interpretation: 1.31 - 2.62 ft/sec, indicative of limited community ambulator General Gait Details: supervision for safety. Pt with controlled velocity and keeping walker in good position   Stairs             Wheelchair Mobility    Modified Rankin (Stroke Patients Only)       Balance Overall balance assessment: Needs assistance Sitting-balance support: No upper extremity supported;Feet  supported Sitting balance-Leahy Scale: Good     Standing balance support: No upper extremity supported Standing balance-Leahy Scale: Fair                              Cognition Arousal/Alertness: Awake/alert Behavior During Therapy: WFL for tasks assessed/performed Overall Cognitive Status: Within Functional Limits for tasks assessed                                        Exercises      General Comments        Pertinent Vitals/Pain Pain Assessment: Faces Faces Pain Scale: Hurts a little bit Pain Location: abdominal incision Pain Descriptors / Indicators: Sore Pain Intervention(s): Limited activity within patient's tolerance    Home Living                      Prior Function            PT Goals (current goals can now be found in the care plan section) Acute Rehab PT Goals Patient Stated Goal: go home Progress towards PT goals: Progressing toward goals    Frequency    Min 3X/week      PT Plan Current plan remains appropriate    Co-evaluation              AM-PAC PT "6 Clicks" Mobility   Outcome Measure  Help needed turning from your back to your side while in a  flat bed without using bedrails?: A Little Help needed moving from lying on your back to sitting on the side of a flat bed without using bedrails?: A Little Help needed moving to and from a bed to a chair (including a wheelchair)?: A Little Help needed standing up from a chair using your arms (e.g., wheelchair or bedside chair)?: A Little Help needed to walk in hospital room?: A Little Help needed climbing 3-5 steps with a railing? : A Little 6 Click Score: 18    End of Session Equipment Utilized During Treatment: Gait belt Activity Tolerance: Patient tolerated treatment well Patient left: in chair;with call bell/phone within reach;with chair alarm set Nurse Communication: Mobility status PT Visit Diagnosis: Difficulty in walking, not elsewhere  classified (R26.2);Muscle weakness (generalized) (M62.81)     Time: 7939-6886 PT Time Calculation (min) (ACUTE ONLY): 14 min  Charges:  $Gait Training: 8-22 mins                     [place] Pager [phone] [place] 10/04/2020, 3:04 PM

## 2020-10-04 NOTE — Progress Notes (Signed)
Pt IV painful to flush. Pt states once the IV is removed he refuses to be stuck for another one. Explained necessity of IV access. Pt still refuses. MD notified.

## 2020-10-04 NOTE — Progress Notes (Signed)
[facility] PROGRESS NOTE    Progress Note  Jeff Kelley  YQI:[id number] DOB: Feb 25, 1971 DOA: 09/21/2020 PCP: Patient, No Pcp Per     Brief Narrative:   Jeff Kelley is an 50 y.o. male past medical history of seizure disorder and anxiety presents to the emergency room for shortness of breath started about 1 month prior to admission, he was noted to have a hemoglobin of 6.1 and transfuse 1 unit of packed red blood cells underwent EGD and colonoscopy on 09/24/2020 that showed candidiasis and large colonic mass biopsy showed adenocarcinoma, CEA was elevated surgery was consulted and he is status post partial colectomy on 09/28/2019.  Oncology was consulted for staging.  Assessment/Plan:   Newly diagnosed adenocarcinoma/symptomatic iron deficiency anemia/status post partial colectomy on 3/17: Colonoscopy showed multiple sessile polyps with a large malignant adenocarcinoma status post partial colectomy. Awaiting a regular diet, switching IV medications to orals.  Esophageal candidiasis: Completed 7-day course of Diflucan on 09/30/2020.  Newly diagnosed ascending aortic aneurysm measuring 4.4 cm: Patient made aware, will need to follow-up imaging as an outpatient in 6 months to a year.  Severe symptomatic iron deficiency anemia: Likely due to large malignant colonic mass. Status post 2 units of packed red blood cells continue to monitor hemoglobin closely. Hemoglobin is slowly trending up.  Elevated troponin likely due to demand ischemia in the setting of severe anemia: The echo done showed no wall motion abnormality EF of 38% grade 2 diastolic heart failure. High resolution coronary CT show high FFR seen by cardiologist recommended left heart cath at some point in the future. Cardiology recommended continuing aspirin and Lipitor.  Diabetes mellitus type 2 uncontrolled: With an A1c of 9.6 continue sliding scale insulin. Continue CBGs before meals and at bedtime.  Seizure  disorder: There is a concern about a complete obstruction of his small bowel. Transition IV medication to oral.  New watery diarrhea: Continues to have watery explosive diarrhea. Has a mild leukocytosis with rising platelet counts. Creatinine appears to be at baseline. We will start on Metamucil and Lomotil.  Thrombocytosis: Acting as an acute phase reactant of unclear etiology, will discuss with surgery. He has remained afebrile with mild white count of 12.  DVT prophylaxis: lovenxo Family Communication:none Status is: Inpatient  Remains inpatient appropriate because:Hemodynamically unstable   Dispo:  Patient From: Home  Planned Disposition: Home  Medically stable for discharge: No     Code Status:     Code Status Orders  (From admission, onward)         Start     Ordered   09/22/20 0020  Full code  Continuous        09/22/20 0021        Code Status History    This patient has a current code status but no historical code status.   Advance Care Planning Activity        IV Access:    Peripheral IV   Procedures and diagnostic studies:   No results found.   Medical Consultants:    None.   Subjective:    Jeff Kelley relates he continues to have explosive watery diarrhea.  Objective:    Vitals:   10/03/20 1608 10/03/20 1947 10/04/20 0630 10/04/20 0643  BP: (!) 145/89 (!) 127/95 130/85   Pulse: 91 98 87   Resp: 20 18 19    Temp: 98.4 F (36.9 C) 98.5 F (36.9 C) 98.8 F (37.1 C)   TempSrc: Oral Oral Oral  SpO2: 97% 98% 98%   Weight:    90.3 kg  Height:       SpO2: 98 % O2 Flow Rate (L/min): 2 L/min   Intake/Output Summary (Last 24 hours) at 10/04/2020 0729 Last data filed at 10/03/2020 1900 Gross per 24 hour  Intake --  Output 800 ml  Net -800 ml   Filed Weights   10/02/20 0500 10/03/20 0535 10/04/20 0643  Weight: 90.1 kg 90.3 kg 90.3 kg    Exam: General exam: In no acute distress. Respiratory system: Good air  movement and clear to auscultation. Cardiovascular system: S1 & S2 heard, RRR. No JVD. Gastrointestinal system: Positive bowel sounds soft nontender nondistended Extremities: No pedal edema. Skin: No rashes, lesions or ulcers Psychiatry: Judgement and insight appear normal. Mood & affect appropriate.   Data Reviewed:    Labs: Basic Metabolic Panel: Recent Labs  Lab 09/30/20 0132 10/01/20 0144 10/02/20 0124 10/03/20 0251 10/04/20 0318  NA 132* 135 135 137 135  K 3.9 3.7 3.7 3.8 3.8  CL 100 103 102 102 99  CO2 18* 22 23 25 25   GLUCOSE 95 92 122* 97 104*  BUN 8 <5* <5* <5* 5*  CREATININE 0.76 0.71 0.65 0.65 0.66  CALCIUM 8.5* 8.5* 8.9 8.9 9.2  MG 1.9 1.7 1.6* 1.8 1.8   GFR Estimated Creatinine Clearance: 116.3 mL/min (by C-G formula based on SCr of 0.66 mg/dL). Liver Function Tests: No results for input(s): AST, ALT, ALKPHOS, BILITOT, PROT, ALBUMIN in the last 168 hours. No results for input(s): LIPASE, AMYLASE in the last 168 hours. No results for input(s): AMMONIA in the last 168 hours. Coagulation profile No results for input(s): INR, PROTIME in the last 168 hours. COVID-19 Labs  No results for input(s): DDIMER, FERRITIN, LDH, CRP in the last 72 hours.  Lab Results  Component Value Date   [place] NEGATIVE 09/21/2020    CBC: Recent Labs  Lab 09/30/20 0132 10/01/20 0144 10/02/20 0124 10/03/20 0251 10/04/20 0318  WBC 12.1* 8.4 8.7 12.4* 12.0*  HGB 9.6* 9.7* 10.2* 10.9* 11.2*  HCT 32.0* 31.9* 34.6* 36.5* 37.6*  MCV 66.5* 66.3* 66.8* 68.0* 68.7*  PLT 470* 506* 651* 743* 972*   Cardiac Enzymes: No results for input(s): CKTOTAL, CKMB, CKMBINDEX, TROPONINI in the last 168 hours. BNP (last 3 results) No results for input(s): PROBNP in the last 8760 hours. CBG: Recent Labs  Lab 10/03/20 1218 10/03/20 1602 10/03/20 1946 10/03/20 2321 10/04/20 0325  GLUCAP 121* 94 144* 127* 108*   D-Dimer: No results for input(s): DDIMER in the last 72 hours. Hgb  A1c: No results for input(s): HGBA1C in the last 72 hours. Lipid Profile: No results for input(s): CHOL, HDL, LDLCALC, TRIG, CHOLHDL, LDLDIRECT in the last 72 hours. Thyroid function studies: No results for input(s): TSH, T4TOTAL, T3FREE, THYROIDAB in the last 72 hours.  Invalid input(s): FREET3 Anemia work up: No results for input(s): VITAMINB12, FOLATE, FERRITIN, TIBC, IRON, RETICCTPCT in the last 72 hours. Sepsis Labs: Recent Labs  Lab 10/01/20 0144 10/02/20 0124 10/03/20 0251 10/04/20 0318  WBC 8.4 8.7 12.4* 12.0*   Microbiology No results found for this or any previous visit (from the past 240 hour(s)).   Medications:    acetaminophen  1,000 mg Oral Q8H   aspirin EC  81 mg Oral Daily   atorvastatin  80 mg Oral Daily   enoxaparin (LOVENOX) injection  40 mg Subcutaneous Q24H   feeding supplement  1 Container Oral TID BM   ferrous sulfate  325 mg Oral Q breakfast   insulin aspart  0-6 Units Subcutaneous Q4H   lamoTRIgine  400 mg Oral BID   LORazepam  1 mg Intravenous Q12H   metoprolol tartrate  25 mg Oral BID   pantoprazole (PROTONIX) IV  40 mg Intravenous Q24H   PHENObarbital  65 mg Intravenous Q12H   potassium chloride  40 mEq Oral Daily   psyllium  1 packet Oral Daily   Continuous Infusions:  sodium chloride     sodium chloride     0.9 % NaCl with KCl 20 mEq / L 50 mL/hr at 10/03/20 1841   lacosamide (VIMPAT) IV Stopped (10/04/20 0700)   levETIRAcetam 1,500 mg (10/03/20 2328)   methocarbamol (ROBAXIN) IV        LOS: 13 days   Charlynne Cousins  [facility]  10/04/2020, 7:29 AM

## 2020-10-05 LAB — GLUCOSE, CAPILLARY
Glucose-Capillary: 126 mg/dL — ABNORMAL HIGH (ref 70–99)
Glucose-Capillary: 131 mg/dL — ABNORMAL HIGH (ref 70–99)

## 2020-10-05 LAB — CBC
HCT: 35.4 % — ABNORMAL LOW (ref 39.0–52.0)
Hemoglobin: 10.3 g/dL — ABNORMAL LOW (ref 13.0–17.0)
MCH: 20 pg — ABNORMAL LOW (ref 26.0–34.0)
MCHC: 29.1 g/dL — ABNORMAL LOW (ref 30.0–36.0)
MCV: 68.9 fL — ABNORMAL LOW (ref 80.0–100.0)
Platelets: 973 10*3/uL (ref 150–400)
RBC: 5.14 MIL/uL (ref 4.22–5.81)
WBC: 10.2 10*3/uL (ref 4.0–10.5)
nRBC: 0 % (ref 0.0–0.2)

## 2020-10-05 LAB — BASIC METABOLIC PANEL
Anion gap: 11 (ref 5–15)
BUN: <5 mg/dL — ABNORMAL LOW (ref 6–20)
CO2: 25 mmol/L (ref 22–32)
Calcium: 8.9 mg/dL (ref 8.9–10.3)
Chloride: 98 mmol/L (ref 98–111)
Creatinine, Ser: 0.69 mg/dL (ref 0.61–1.24)
GFR, Estimated: >60 mL/min (ref >60)
Glucose, Bld: 101 mg/dL — ABNORMAL HIGH (ref 70–99)
Potassium: 3.9 mmol/L (ref 3.5–5.1)
Sodium: 134 mmol/L — ABNORMAL LOW (ref 135–145)

## 2020-10-05 LAB — MAGNESIUM: Magnesium: 1.8 mg/dL (ref 1.7–2.4)

## 2020-10-05 MED ORDER — FERROUS SULFATE 325 (65 FE) MG PO TABS: 325.0000 mg | ORAL_TABLET | Freq: Every day | ORAL | 0 refills | Status: DC

## 2020-10-05 MED ORDER — METFORMIN HCL 500 MG PO TABS: 500.0000 mg | ORAL_TABLET | Freq: Two times a day (BID) | ORAL | 11 refills | Status: DC

## 2020-10-05 MED ORDER — ATORVASTATIN CALCIUM 80 MG PO TABS: 80.0000 mg | ORAL_TABLET | Freq: Every day | ORAL | 3 refills | Status: DC

## 2020-10-05 MED ORDER — PSYLLIUM 95 % PO PACK: 1.0000 | PACK | Freq: Every day | ORAL | 0 refills | Status: DC

## 2020-10-05 MED ORDER — LOPERAMIDE HCL 2 MG PO CAPS: 4.0000 mg | ORAL_CAPSULE | ORAL | 0 refills | Status: DC | PRN

## 2020-10-05 MED ORDER — ASPIRIN 81 MG PO TBEC: 81.0000 mg | DELAYED_RELEASE_TABLET | Freq: Every day | ORAL | 11 refills | Status: DC

## 2020-10-05 MED ORDER — METOPROLOL TARTRATE 25 MG PO TABS: 25.0000 mg | ORAL_TABLET | Freq: Two times a day (BID) | ORAL | 0 refills | Status: DC

## 2020-10-05 MED ORDER — LACOSAMIDE 100 MG PO TABS: 100.0000 mg | ORAL_TABLET | Freq: Two times a day (BID) | ORAL | 0 refills | Status: DC

## 2020-10-05 NOTE — Discharge Summary (Addendum)
Physician Discharge Summary  Jeff Kelley LKT:[id number] DOB: Apr 10, 1971 DOA: 09/21/2020  PCP: Patient, No Pcp Per  Admit date: 09/21/2020 Discharge date: 10/05/2020  Admitted From: Home Disposition:  Home  Recommendations for Outpatient Follow-up:  Follow up with PCP in 1-2 weeks Please obtain BMP/CBC in one week Follow-up with surgery for pathology results. PCP to titrate heart failure medications as needed.   Home Health:yes Equipment/Devices:None  Discharge Condition:stable CODE STATUS:full Diet recommendation: Heart Healthy  Brief/Interim Summary:  50 y.o. male past medical history of seizure disorder and anxiety presents to the emergency room for shortness of breath started about 1 month prior to admission, he was noted to have a hemoglobin of 6.1 and transfuse 1 unit of packed red blood cells underwent EGD and colonoscopy on 09/24/2020 that showed candidiasis and large colonic mass biopsy showed adenocarcinoma, CEA was elevated surgery was consulted and he is status post partial colectomy on 09/28/2019.  Oncology was consulted for staging  Discharge Diagnoses:  Principal Problem:   Symptomatic anemia Active Problems:   Seizures (HCC)   Occult GI bleeding   Elevated troponin   CHF (congestive heart failure) (HCC)   Hyperglycemia   Iron deficiency anemia   Hyponatremia   Abnormal echocardiogram  Newly diagnosed adenocarcinoma/symptomatic iron deficiency anemia status post partial colectomy on 09/28/2019: Colonoscopy was done that showed multiple cecal polyp and large medically and adenocarcinoma status post partial colectomy. His diet was advanced after surgery which he tolerated well. Having regular bowel movement denied any abdominal pain postsurgical so he was discharged in stable condition.  Esophageal candidiasis: Completed 7-day course of IV Diflucan.  Newly diagnosed ascending aortic aneurysm measuring 4.4 cm: Patient made aware follow-up with PCP as an  outpatient will need a repeat imaging in 6 months to a year.  Severe symptomatic iron deficiency anemia: Likely due to colon cancer he status post 2 units of packed red blood cells he was started on ferrous sulfate we will continue as an outpatient.  Chronic diastolic heart failure: With an EF of 55% on 09/22/2020 with grade 2 diastolic heart failure. We will continue his current home meds follow-up with PCP and titrate medications as tolerated.  Elevated troponin: Likely due to demand ischemia in the setting of severe anemia 2D echo was unremarkable high-resolution CT scan showed a high FFR seen by cardiologist recommended a left heart cath at some point in the future as an outpatient.  Next line cardiology recommended to continue aspirin Lipitor.  Newly diagnosed diabetes mellitus type 2 uncontrolled: With an A1c of 9.6. He was covered in the hospital with minimal insulin. He was started on Metformin 500 mg p.o. twice daily.  Seizure disorder: No changes made to his medication.  New watery diarrhea: Likely due to surgical procedure. He was started on Metamucil Lomotil and this resolved.  Thrombocytosis:  likely acting as an acute phase reactant he had a mild elevated leukocytosis he was tolerating his diet remained afebrile with no abdominal pain. When his white count started trended down his bowel movements regulated his platelets stabilize so he was discharged in stable condition.    Discharge Instructions  Discharge Instructions     Diet - low sodium heart healthy   Complete by: As directed    Increase activity slowly   Complete by: As directed    No wound care   Complete by: As directed       Allergies as of 10/05/2020       Reactions   Codeine Other (  See Comments)   Patient states he was told he was allergic when he was [age] years old, unknown reaction.   Penicillins Other (See Comments)   Patient states he was told he was allergic when he was [age] years ago,  unknown reaction        Medication List     TAKE these medications    acetaminophen 500 MG tablet Commonly known as: TYLENOL Take 1,000 mg by mouth every 6 (six) hours as needed for headache (pain).   aspirin 81 MG EC tablet Take 1 tablet (81 mg total) by mouth daily. Swallow whole.   atorvastatin 80 MG tablet Commonly known as: LIPITOR Take 1 tablet (80 mg total) by mouth daily.   Cenobamate 14 x 50 MG & 14 x100 MG Tbpk Take 50-100 mg by mouth See admin instructions. Take one 50 mg tablet daily for 2 weeks, then take one 100 mg tablet daily for 2 weeks, then fill 3rd month's pack   esomeprazole 20 MG capsule Commonly known as: NEXIUM Take 20 mg by mouth daily at 12 noon.   ferrous sulfate 325 (65 FE) MG tablet Take 1 tablet (325 mg total) by mouth daily with breakfast.   Lacosamide 100 MG Tabs Take 1 tablet (100 mg total) by mouth 2 (two) times daily.   lamoTRIgine 200 MG tablet Commonly known as: LAMICTAL Take 400 mg by mouth 2 (two) times daily.   levETIRAcetam 750 MG tablet Commonly known as: KEPPRA Take 1,500 mg by mouth See admin instructions. Previous dose 2 tablets (1500 mg) by mouth twice daily - reduce dose starting 09/21/2020 - take 1 1/2 tablets (1125 mg) by mouth twice daily for 3-4 weeks then take 1 tablet (750 mg) twice daily, then consult MD for further instructions.   loperamide 2 MG capsule Commonly known as: IMODIUM Take 2 capsules (4 mg total) by mouth as needed for diarrhea or loose stools.   LORazepam 1 MG tablet Commonly known as: ATIVAN Take 1 mg by mouth 2 (two) times daily.   metFORMIN 500 MG tablet Commonly known as: Glucophage Take 1 tablet (500 mg total) by mouth 2 (two) times daily with a meal.   metoprolol tartrate 25 MG tablet Commonly known as: LOPRESSOR Take 1 tablet (25 mg total) by mouth 2 (two) times daily.   multivitamin with minerals Tabs tablet Take 1 tablet by mouth daily.   PHENobarbital 64.8 MG tablet Commonly  known as: LUMINAL Take 64.8 mg by mouth 2 (two) times daily.   psyllium 95 % Pack Commonly known as: HYDROCIL/METAMUCIL Take 1 packet by mouth daily.   Vitamin D3 125 MCG (5000 UT) Caps Take 5,000 Units by mouth daily.               Durable Medical Equipment  (From admission, onward)           Start     Ordered   10/03/20 1417  For home use only DME Walker rolling  Once       Question Answer Comment  Walker: With 5 Inch Wheels   Patient needs a walker to treat with the following condition Weakness      10/03/20 1416            Follow-up Information     [name], Cassie Freer, MD Follow up on 11/05/2020.   Specialties: Internal Medicine, Cardiology, Radiology Why: 9:20 am Contact information: [address] [place] [address] [phone]         [place], [place]. Go on 10/11/2020.   Specialty: General Surgery Why: RN visit for staple removal scheduled for 2:00 PM. Please arrive 30 min prior to appointment time.  Contact information: [address] [place] [address] [phone]         Jovita Kussmaul, MD. Go on 11/02/2020.   Specialty: General Surgery Why: Follow up appointment scheduled for 9:30 AM. Please arrive 15 min prior to appointment time. Bring photo ID and insurance information.  Contact information: [address] [place] [address] [phone]                Allergies  Allergen Reactions   Codeine Other (See Comments)    Patient states he was told he was allergic when he was [age] years old, unknown reaction.   Penicillins Other (See Comments)    Patient states he was told he was allergic when he was [age] years ago, unknown reaction    Consultations: Gastroenterology General surgery   Procedures/Studies: DG Chest 2 View  Result Date: 09/21/2020 CLINICAL DATA:  Shortness of breath. EXAM: CHEST - 2 VIEW COMPARISON:  None. FINDINGS: The heart size is unremarkable. There are  prominent interstitial lung markings bilaterally. There are trace to small bilateral pleural effusions, right greater than left. There is no pneumothorax. No acute osseous abnormality. IMPRESSION: Prominent interstitial lung markings with small bilateral pleural effusions, right greater than left. Findings may be secondary to congestive heart failure versus an atypical infectious process. Electronically Signed   By: Constance Holster M.D.   On: 09/21/2020 17:51   CT CHEST ABDOMEN PELVIS W CONTRAST  Result Date: 09/24/2020 CLINICAL DATA:  Fungating ascending colonic mass on colonoscopy, suspect colon cancer EXAM: CT CHEST, ABDOMEN, AND PELVIS WITH CONTRAST TECHNIQUE: Multidetector CT imaging of the chest, abdomen and pelvis was performed following the standard protocol during bolus administration of intravenous contrast. CONTRAST:  127mL OMNIPAQUE IOHEXOL 300 MG/ML  SOLN COMPARISON:  09/21/2020 FINDINGS: CT CHEST FINDINGS Cardiovascular: The heart is unremarkable without pericardial effusion. Ascending thoracic aortic aneurysm measuring 4.4 cm. No evidence of dissection. Minimal atherosclerosis of the distal aortic arch. Mediastinum/Nodes: No enlarged mediastinal, hilar, or axillary lymph nodes. Thyroid gland, trachea, and esophagus demonstrate no significant findings. Lungs/Pleura: There are small bilateral pleural effusions, with minimal dependent lower lobe atelectasis. Patchy ground-glass consolidation within the right upper lobe, suspect hypoventilatory changes or even mild aspiration given recent endoscopy. Inflammatory or infectious etiology could also be considered though less likely. No pneumothorax. The central airways are patent. Musculoskeletal: No acute or destructive bony lesions. CT ABDOMEN PELVIS FINDINGS Hepatobiliary: No focal liver abnormality. No evidence of metastatic disease. No biliary dilation. The gallbladder is unremarkable. Pancreas: Unremarkable. No pancreatic ductal dilatation or  surrounding inflammatory changes. Spleen: Normal in size without focal abnormality. Adrenals/Urinary Tract: Right renal cysts are identified within the mid and lower poles. Subcentimeter hypodensities within the upper pole right kidney and throughout the left kidney are too small to characterize, but also likely reflect cysts. There are 2 small nonobstructing calculi within the lower pole left kidney, measuring up to 3 mm in size. No obstructive uropathy within either kidney. The adrenals and bladder are unremarkable. Stomach/Bowel: An eccentric intraluminal and transmural mass is seen along the anti mesenteric aspect of the proximal colon, measuring approximately 5.0 x 1.4 cm in transverse dimension, and extending approximately 3.7 cm in craniocaudal extent. Findings are compatible with the colon mass described on today's colonoscopy, highly suspicious for malignancy. The remainder of  the bowel is unremarkable without obstruction or ileus. Normal appendix right lower quadrant. Vascular/Lymphatic: Mild atherosclerosis throughout the aorta and iliac vessels. There are multiple small lymph nodes in the right lower quadrant mesentery, measuring up to 6 mm in size, nonspecific. No pathologic adenopathy within the abdomen or pelvis. Reproductive: Prostate is unremarkable. Other: Ventriculostomy catheter enters the abdomen in the right lower quadrant, tip within the left hemipelvis. No free fluid or free intraperitoneal gas. No abdominal wall hernia. Musculoskeletal: No acute or destructive bony lesions. IMPRESSION: 1. 5.0 x 1.4 x 3.7 cm eccentric intraluminal and transmural proximal colonic mass highly concerning for colon cancer. No evidence of obstruction. 2. Multiple subcentimeter lymph nodes within the right lower quadrant mesentery, nonspecific. No pathologically enlarged nodes. PET scan may be useful if there is concern for regional nodal metastases, though small size may limit evaluation. 3. Small bilateral  pleural effusions. 4. Minimal ground-glass airspace disease within the right upper lobe, favor hypoventilatory change or aspiration after upper endoscopy. 5. 4.4 cm ascending thoracic aortic aneurysm. Recommend annual imaging followup by CTA or MRA. This recommendation follows 2010 ACCF/AHA/AATS/ACR/ASA/SCA/SCAI/SIR/STS/SVM Guidelines for the Diagnosis and Management of Patients with Thoracic Aortic Disease. Circulation. 2010; 121: I433-I951. Aortic aneurysm NOS (ICD10-I71.9) 6.  Aortic Atherosclerosis (ICD10-I70.0). Electronically Signed   By: Randa Ngo M.D.   On: 09/24/2020 21:11   CT CORONARY MORPH W/CTA COR W/SCORE W/CA W/CM &/OR WO/CM  Addendum Date: 09/25/2020   ADDENDUM REPORT: 09/25/2020 15:25 EXAM: Cardiac/Coronary  CT TECHNIQUE: The patient was scanned on a [employer]. FINDINGS: A 120 kV prospective scan was triggered in the descending thoracic aorta at 111 HU's. Axial non-contrast 3 mm slices were carried out through the heart. The data set was analyzed on a dedicated work station and scored using the [place]. Gantry rotation speed was 250 msecs and collimation was .6 mm. No beta blockade and 0.8 mg of sl NTG was given. The 3D data set was reconstructed in 5% intervals of the 67-82 % of the R-R cycle. Diastolic phases were analyzed on a dedicated work station using MPR, MIP and VRT modes. The patient received 80 cc of contrast. Aorta: Mildly dilated ascending aorta at 31mm (measured at the bifurcation of the main pulmonary artery). No calcifications. No dissection. Aortic Valve:  Trileaflet.  Scattered calcifications. Coronary Arteries:  Normal coronary origin.  Right dominance. RCA is a large dominant artery that gives rise to PDA and PLVB. There is mild calcified plaque in the proximal and mid RCA with associated stenosis of 25-49%. There is moderate to severe non calcified plaque in the mid to distal RCA with associated stenosis of at least 50-69% but could be higher than  70%. Study is limited by noise artifact. Left main is a large artery that gives rise to LAD and LCX arteries. There is no plaque. LAD is a large vessel that gives rise to a large trifurcating diagonal and a moderate sized D2. There is a high grade complex long severe mixed plaque in the mid LAD after the takeoff of a large diagonal with associated stenosis of 70-99%. The D1 has calcified plaque in the ostium that is at least 25-49% and possible non calcified plaque in the mid D1 but noise artifact prohibits accurate quantification. LCX is a non-dominant small artery that gives rise to one small OM1 branch. Only the proximal LCx is adequately visualized and there is no plaque. Other findings: Normal pulmonary vein drainage into the left atrium. Normal let atrial appendage without  a thrombus. Normal size of the pulmonary artery. IMPRESSION: 1. Coronary calcium score of 192. This was 94th percentile for age and sex matched control. 2.  Normal coronary origin with right dominance. 3.  Severe atherosclerosis.  CAD RADS 4a. 4. Consider symptom-guided anti-ischemic and preventive pharmacotherapy as well as risk factor modification per guideline-directed care. 5.  Consider cardiac catheterization. 6.  This study has been submitted for FFR flow analysis. Fransico Him Electronically Signed   By: Fransico Him   On: 09/25/2020 15:25   Result Date: 09/25/2020 EXAM: OVER-READ INTERPRETATION  CT CHEST The following report is an over-read performed by radiologist Dr. Vinnie Langton of [facility], [place] [facility] on 09/25/2020. This over-read does not include interpretation of cardiac or coronary anatomy or pathology. The coronary calcium score/coronary CTA interpretation by the cardiologist is attached. COMPARISON:  None. FINDINGS: Aortic atherosclerosis with ectasia of the ascending thoracic aorta (4.0 cm in diameter). Trace bilateral pleural effusions lying dependently with associated areas of subsegmental atelectasis in the  dependent portions of the lower lobes of the lungs bilaterally. Within the visualized portions of the thorax there are no suspicious appearing pulmonary nodules or masses, there is no acute consolidative airspace disease, no pneumothorax and no lymphadenopathy. Visualized portions of the upper abdomen are unremarkable. There are no aggressive appearing lytic or blastic lesions noted in the visualized portions of the skeleton. IMPRESSION: 1. Trace bilateral pleural effusions lying dependently with some associated passive subsegmental atelectasis in the lower lobes of the lungs bilaterally. 2.  Aortic Atherosclerosis (ICD10-I70.0). 3. Ectasia of the ascending thoracic aorta (4.0 cm in diameter). Recommend annual imaging followup by CTA or MRA. This recommendation follows 2010 ACCF/AHA/AATS/ACR/ASA/SCA/SCAI/SIR/STS/SVM Guidelines for the Diagnosis and Management of Patients with Thoracic Aortic Disease. Circulation. 2010; 121: J673-A193. Aortic aneurysm NOS (ICD10-I71.9). Electronically Signed: By: Vinnie Langton M.D. On: 09/25/2020 11:20   CT CORONARY FRACTIONAL FLOW RESERVE DATA PREP  Result Date: 09/27/2020 EXAM: FFRCT ANALYSIS FINDINGS: FFRct analysis was performed on the original cardiac CT angiogram dataset. Diagrammatic representation of the FFRct analysis is provided in a separate PDF document in PACS. This dictation was created using the PDF document and an interactive 3D model of the results. 3D model is not available in the EMR/PACS. Normal FFR range is >0.80. 1. Left Main: No significant stenosis. LM FFR = 0.99. 2. LAD: Possible significant stenosis in mid LAD. Proximal FFR = 0.98, Mid FFR = 0.52, Distal FFR = <0.5. 3. LCX: No significant stenosis. Proximal FFR = 0.98, Mid FFR = 0.95. Distal FFR could not be evaluated 4. RCA: Possible significant stenosis in the distal RCA. Proximal FFR = 0.98, Mid FFR = 0.91, Distal FFR = 0.64. IMPRESSION: 1. Coronary CTA FFR flow analysis demonstrates possible  hemodynamically significant flow limiting lesions in the mid LAD and distal RCA. 2.   Recommend Cardiac Cath. Fransico Him Electronically Signed   By: Fransico Him   On: 09/27/2020 13:49   ECHOCARDIOGRAM COMPLETE  Result Date: 09/22/2020    ECHOCARDIOGRAM REPORT   Patient Name:   Jeff Kelley Date of Exam: 09/22/2020 Medical Rec #:  [id number]       Height:       66.0 in Accession #:    [id number]      Weight:       201.9 lb Date of Birth:  [birth year]       BSA:          2.008 m Patient Age:    50 years  BP:           137/93 mmHg Patient Gender: M               HR:           94 bpm. Exam Location:  Inpatient Procedure: 2D Echo, 3D Echo, Cardiac Doppler, Color Doppler and Strain Analysis Indications:    Dyspnea and elevated troponins  History:        Patient has no prior history of Echocardiogram examinations.                 CHF.  Sonographer:    Luisa Hart RDCS Referring Phys: [id number] [place]  1. Left ventricular ejection fraction by 3D volume is 56 %. The left ventricle has normal function. The left ventricle demonstrates regional wall motion abnormalities (see scoring diagram/findings for description). Left ventricular diastolic parameters are consistent with Grade II diastolic dysfunction (pseudonormalization).  2. Right ventricular systolic function is normal. The right ventricular size is normal. Tricuspid regurgitation signal is inadequate for assessing PA pressure.  3. The mitral valve is normal in structure. Trivial mitral valve regurgitation. No evidence of mitral stenosis.  4. The aortic valve is grossly normal. Aortic valve regurgitation is not visualized. No aortic stenosis is present.  5. Aortic dilatation noted. There is mild dilatation of the aortic root, measuring 40 mm.  6. The inferior vena cava is normal in size with greater than 50% respiratory variability, suggesting right atrial pressure of 3 mmHg. FINDINGS  Left Ventricle: Left ventricular ejection fraction by  3D volume is 56 %. The left ventricle has normal function. The left ventricle demonstrates regional wall motion abnormalities. Global longitudinal strain performed but not reported based on interpreter judgement due to suboptimal tracking. The left ventricular internal cavity size was normal in size. There is no left ventricular hypertrophy. Left ventricular diastolic parameters are consistent with Grade II diastolic dysfunction (pseudonormalization).  LV Wall Scoring: The mid and distal anterior septum and apical anterior segment are hypokinetic. Right Ventricle: The right ventricular size is normal. No increase in right ventricular wall thickness. Right ventricular systolic function is normal. Tricuspid regurgitation signal is inadequate for assessing PA pressure. Left Atrium: Left atrial size was normal in size. Right Atrium: Right atrial size was normal in size. Pericardium: There is no evidence of pericardial effusion. Mitral Valve: The mitral valve is normal in structure. Trivial mitral valve regurgitation. No evidence of mitral valve stenosis. Tricuspid Valve: The tricuspid valve is normal in structure. Tricuspid valve regurgitation is not demonstrated. No evidence of tricuspid stenosis. Aortic Valve: The aortic valve is grossly normal. Aortic valve regurgitation is not visualized. No aortic stenosis is present. Aortic valve mean gradient measures 6.0 mmHg. Aortic valve peak gradient measures 11.3 mmHg. Aortic valve area, by VTI measures  3.63 cm. Pulmonic Valve: The pulmonic valve was grossly normal. Pulmonic valve regurgitation is trivial. No evidence of pulmonic stenosis. Aorta: Aortic dilatation noted. There is mild dilatation of the aortic root, measuring 40 mm. Venous: A systolic blunting flow pattern is recorded from the right lower pulmonary vein. The inferior vena cava is normal in size with greater than 50% respiratory variability, suggesting right atrial pressure of 3 mmHg. IAS/Shunts: No atrial  level shunt detected by color flow Doppler.  LEFT VENTRICLE PLAX 2D LVIDd:         5.00 cm         Diastology LVIDs:         4.70 cm  LV e' medial:    4.79 cm/s LV PW:         0.80 cm         LV E/e' medial:  28.8 LV IVS:        1.00 cm         LV e' lateral:   11.60 cm/s LVOT diam:     2.30 cm         LV E/e' lateral: 11.9 LV SV:         103 LV SV Index:   51 LVOT Area:     4.15 cm        3D Volume EF                                LV 3D EF:    Left                                             ventricular                                             ejection                                             fraction by                                             3D volume                                             is 56 %.                                 3D Volume EF:                                3D EF:        56 %  PULMONARY VEINS A Reversal Duration: 116.00 msec A Reversal Velocity: 69.67 cm/s Diastolic Velocity:  89.38 cm/s S/D Velocity:        1.01 Systolic Velocity:   75.10 cm/s LEFT ATRIUM           Index       RIGHT ATRIUM           Index LA diam:      4.70 cm 2.34 cm/m  RA Area:     13.00 cm LA Vol (A2C): 45.5 ml 22.66 ml/m RA Volume:   27.40 ml  13.65 ml/m  AORTIC VALVE                    PULMONIC VALVE AV Area (Vmax):    3.73 cm     PV Vmax:  1.09 m/s AV Area (Vmean):   3.89 cm     PV Vmean:      84.200 cm/s AV Area (VTI):     3.63 cm     PV VTI:        0.198 m AV Vmax:           168.00 cm/s  PV Peak grad:  4.7 mmHg AV Vmean:          109.000 cm/s PV Mean grad:  3.0 mmHg AV VTI:            0.284 m AV Peak Grad:      11.3 mmHg AV Mean Grad:      6.0 mmHg LVOT Vmax:         151.00 cm/s LVOT Vmean:        102.000 cm/s LVOT VTI:          0.248 m LVOT/AV VTI ratio: 0.87  AORTA Ao Root diam: 4.00 cm MITRAL VALVE MV Area (PHT): 5.23 cm     SHUNTS MV Decel Time: 145 msec     Systemic VTI:  0.25 m MR Peak grad: 111.9 mmHg    Systemic Diam: 2.30 cm MR Mean grad: 76.0 mmHg MR Vmax:      529.00 cm/s  MR Vmean:     417.0 cm/s MV E velocity: 138.00 cm/s MV A velocity: 70.40 cm/s MV E/A ratio:  1.96 Cherlynn Kaiser MD Electronically signed by Cherlynn Kaiser MD Signature Date/Time: 09/22/2020/4:28:35 PM    Final    (Echo, Carotid, EGD, Colonoscopy, ERCP)    Subjective: No new complaints  Discharge Exam: Vitals:   10/04/20 2123 10/05/20 0614  BP: (!) 145/92 (!) 129/91  Pulse: 93 98  Resp: 18 18  Temp: 100.2 F (37.9 C) 98.7 F (37.1 C)  SpO2: 96% 97%   Vitals:   10/04/20 0643 10/04/20 1609 10/04/20 2123 10/05/20 0614  BP:  (!) 136/92 (!) 145/92 (!) 129/91  Pulse:  85 93 98  Resp:  20 18 18   Temp:  98.9 F (37.2 C) 100.2 F (37.9 C) 98.7 F (37.1 C)  TempSrc:   Oral   SpO2:  100% 96% 97%  Weight: 90.3 kg     Height:        General: Pt is alert, awake, not in acute distress Cardiovascular: RRR, S1/S2 +, no rubs, no gallops Respiratory: CTA bilaterally, no wheezing, no rhonchi Abdominal: Soft, NT, ND, bowel sounds + Extremities: no edema, no cyanosis    The results of significant diagnostics from this hospitalization (including imaging, microbiology, ancillary and laboratory) are listed below for reference.     Microbiology: No results found for this or any previous visit (from the past 240 hour(s)).   Labs: BNP (last 3 results) Recent Labs    09/21/20 2051  BNP 703.5*   Basic Metabolic Panel: Recent Labs  Lab 10/01/20 0144 10/02/20 0124 10/03/20 0251 10/04/20 0318 10/05/20 0249  NA 135 135 137 135 134*  K 3.7 3.7 3.8 3.8 3.9  CL 103 102 102 99 98  CO2 22 23 25 25 25   GLUCOSE 92 122* 97 104* 101*  BUN <5* <5* <5* 5* <5*  CREATININE 0.71 0.65 0.65 0.66 0.69  CALCIUM 8.5* 8.9 8.9 9.2 8.9  MG 1.7 1.6* 1.8 1.8 1.8   Liver Function Tests: No results for input(s): AST, ALT, ALKPHOS, BILITOT, PROT, ALBUMIN in the last 168 hours. No results for input(s): LIPASE, AMYLASE in the last 168 hours. No results  for input(s): AMMONIA in the last 168  hours. CBC: Recent Labs  Lab 10/01/20 0144 10/02/20 0124 10/03/20 0251 10/04/20 0318 10/05/20 0249  WBC 8.4 8.7 12.4* 12.0* 10.2  HGB 9.7* 10.2* 10.9* 11.2* 10.3*  HCT 31.9* 34.6* 36.5* 37.6* 35.4*  MCV 66.3* 66.8* 68.0* 68.7* 68.9*  PLT 506* 651* 743* 972* 973*   Cardiac Enzymes: No results for input(s): CKTOTAL, CKMB, CKMBINDEX, TROPONINI in the last 168 hours. BNP: Invalid input(s): POCBNP CBG: Recent Labs  Lab 10/04/20 1157 10/04/20 1604 10/04/20 2033 10/04/20 2350 10/05/20 0508  GLUCAP 129* 87 144* 96 126*   D-Dimer No results for input(s): DDIMER in the last 72 hours. Hgb A1c No results for input(s): HGBA1C in the last 72 hours. Lipid Profile No results for input(s): CHOL, HDL, LDLCALC, TRIG, CHOLHDL, LDLDIRECT in the last 72 hours. Thyroid function studies No results for input(s): TSH, T4TOTAL, T3FREE, THYROIDAB in the last 72 hours.  Invalid input(s): FREET3 Anemia work up No results for input(s): VITAMINB12, FOLATE, FERRITIN, TIBC, IRON, RETICCTPCT in the last 72 hours. Urinalysis No results found for: COLORURINE, APPEARANCEUR, LABSPEC, [place], GLUCOSEU, HGBUR, BILIRUBINUR, KETONESUR, PROTEINUR, UROBILINOGEN, NITRITE, LEUKOCYTESUR Sepsis Labs Invalid input(s): PROCALCITONIN,  WBC,  LACTICIDVEN Microbiology No results found for this or any previous visit (from the past 240 hour(s)).   Time coordinating discharge: Over 30 minutes  SIGNED:   Charlynne Cousins, MD  [facility] 10/05/2020, 8:01 AM Pager   If 7PM-7AM, please contact night-coverage [email or web address] Password [email or web address]

## 2020-10-05 NOTE — Progress Notes (Signed)
Occupational Therapy Treatment Patient Details Name: Jeff Kelley MRN: [id number] DOB: 09/28/1970 Today's Date: 10/05/2020    History of present illness 50 y.o. male with medical history significant for seizure disorder and anxiety, now presented 09/21/20 to the emergency department for evaluation of shortness of breath. Hgb 6.1; found new onset DM; 30 lb weight loss recently; Endoscopy showed a large malignant tumor in the proximal ascending colon; ST changes on EKG per cardiology "does not represent an acute coronary syndrome.  This represents demand ischemia in the setting of severe anemia." 3/17 partial colectomy   OT comments  OT treatment session with focus on self-care re-education, OOB activity tolerance and ADL transfers with use of RW. Patient making steady progress toward goals but continues to be limited by deficits listed below 2/2 diagnosis above. OT will continue to follow acutely.    Follow Up Recommendations  Home health OT;Supervision - Intermittent    Equipment Recommendations  3 in 1 bedside commode    Recommendations for Other Services      Precautions / Restrictions Precautions Precautions: Fall Precaution Comments: fall 3/22 Restrictions Weight Bearing Restrictions: No       Mobility Bed Mobility               General bed mobility comments: Patient seated on BSC upon entry.    Transfers Overall transfer level: Needs assistance Equipment used: Rolling walker (2 wheeled) Transfers: Sit to/from Stand Sit to Stand: Supervision;Min guard         General transfer comment: Min guard initially. Pro    Balance Overall balance assessment: Needs assistance Sitting-balance support: No upper extremity supported;Feet supported Sitting balance-Leahy Scale: Good     Standing balance support: No upper extremity supported Standing balance-Leahy Scale: Fair Standing balance comment: Reliant on BUE on RW.                           ADL  either performed or assessed with clinical judgement   ADL       Grooming: Set up;Sitting Grooming Details (indicate cue type and reason): 2/3 grooming tasks seated at sink level with set-up assist.             Lower Body Dressing: Min guard;Cueing for safety;Cueing for sequencing;Sitting/lateral leans;Sit to/from stand Lower Body Dressing Details (indicate cue type and reason): Able to attain/maintain figure-4 position to adjust socks in sitting. Toilet Transfer: Min guard;Cueing for safety;Ambulation;RW;Regular Glass blower/designer Details (indicate cue type and reason): Min guard with use of RW to [facility]. Toileting- Water quality scientist and Hygiene: Min guard;Sit to/from stand Toileting - Clothing Manipulation Details (indicate cue type and reason): Min guard for hygiene/clothing management.     Functional mobility during ADLs: Min guard;Cueing for safety General ADL Comments: Pt continues to be limited by decreased strength and increased need for assist for LB ADL due to colectomy. Pt with pain at incision cite.     Vision       Perception     Praxis      Cognition Arousal/Alertness: Awake/alert Behavior During Therapy: WFL for tasks assessed/performed Overall Cognitive Status: Within Functional Limits for tasks assessed                                          Exercises     Shoulder Instructions       General Comments  Stables on abdomen clean/dry. Open to room air.    Pertinent Vitals/ Pain       Pain Assessment: Faces Faces Pain Scale: Hurts little more Pain Location: abdominal incision Pain Descriptors / Indicators: Sore Pain Intervention(s): Monitored during session;Repositioned  Home Living                                          Prior Functioning/Environment              Frequency  Min 2X/week        Progress Toward Goals  OT Goals(current goals can now be found in the care plan section)  Progress  towards OT goals: Progressing toward goals  Acute Rehab OT Goals Patient Stated Goal: To return home. ADL Goals Pt Will Perform Lower Body Dressing: with supervision;sitting/lateral leans;sit to/from stand;with adaptive equipment Additional ADL Goal #1: Pt will perform OOB ADL with modified independence with no cues for safety.  Plan Discharge plan remains appropriate;Frequency remains appropriate    Co-evaluation                 AM-PAC OT "6 Clicks" Daily Activity     Outcome Measure   Help from another person eating meals?: None Help from another person taking care of personal grooming?: A Little Help from another person toileting, which includes using toliet, bedpan, or urinal?: A Little Help from another person bathing (including washing, rinsing, drying)?: A Little Help from another person to put on and taking off regular upper body clothing?: None Help from another person to put on and taking off regular lower body clothing?: A Little 6 Click Score: 20    End of Session Equipment Utilized During Treatment: Rolling walker (Did not use gait belt 2/2 staples on abdomen.)  OT Visit Diagnosis: Unsteadiness on feet (R26.81);Muscle weakness (generalized) (M62.81);Pain Pain - part of body:  (Abdomen)   Activity Tolerance Patient tolerated treatment well   Patient Left in chair;with call bell/phone within reach;with chair alarm set   Nurse Communication Mobility status        Time: 6468-0321 OT Time Calculation (min): 26 min  Charges: OT General Charges $OT Visit: 1 Visit OT Treatments $Self Care/Home Management : 23-37 mins  Lakindra Wible H. OTR/L Supplemental OT, Department of rehab services [phone]   Jeff Voorhees R H. 10/05/2020, 10:29 AM

## 2020-10-18 ENCOUNTER — Inpatient Hospital Stay: Payer: Medicare HMO | Attending: Oncology | Admitting: Oncology

## 2020-10-18 ENCOUNTER — Other Ambulatory Visit: Payer: Self-pay

## 2020-10-18 ENCOUNTER — Telehealth: Payer: Self-pay | Admitting: Oncology

## 2020-10-18 ENCOUNTER — Telehealth: Payer: Self-pay | Admitting: *Deleted

## 2020-10-18 VITALS — BP 118/77 | HR 82 | Temp 98.1°F | Resp 20 | Ht 65.0 in | Wt 183.6 lb

## 2020-10-18 DIAGNOSIS — C182 Malignant neoplasm of ascending colon: Secondary | ICD-10-CM | POA: Insufficient documentation

## 2020-10-18 DIAGNOSIS — Z9049 Acquired absence of other specified parts of digestive tract: Secondary | ICD-10-CM

## 2020-10-18 DIAGNOSIS — G40909 Epilepsy, unspecified, not intractable, without status epilepticus: Secondary | ICD-10-CM

## 2020-10-18 DIAGNOSIS — D508 Other iron deficiency anemias: Secondary | ICD-10-CM

## 2020-10-18 DIAGNOSIS — E119 Type 2 diabetes mellitus without complications: Secondary | ICD-10-CM

## 2020-10-18 NOTE — Telephone Encounter (Signed)
Scheduled apt per 4/7 los - gave patient AVS and calender
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
## 2020-10-18 NOTE — Telephone Encounter (Signed)
Referral sent to [facility] and left message for patient and significant other to call for appointment [phone].

## 2020-10-31 ENCOUNTER — Other Ambulatory Visit: Payer: Self-pay

## 2020-11-02 ENCOUNTER — Inpatient Hospital Stay: Payer: Medicare HMO

## 2020-11-02 ENCOUNTER — Other Ambulatory Visit: Payer: Self-pay

## 2020-11-02 DIAGNOSIS — C182 Malignant neoplasm of ascending colon: Secondary | ICD-10-CM | POA: Diagnosis present

## 2020-11-02 LAB — CBC WITH DIFFERENTIAL (CANCER CENTER ONLY)
Abs Immature Granulocytes: 0.02 10*3/uL (ref 0.00–0.07)
Basophils Absolute: 0.1 10*3/uL (ref 0.0–0.1)
Basophils Relative: 1 %
Eosinophils Absolute: 0.1 10*3/uL (ref 0.0–0.5)
Eosinophils Relative: 1 %
HCT: 41.5 % (ref 39.0–52.0)
Hemoglobin: 12.9 g/dL — ABNORMAL LOW (ref 13.0–17.0)
Immature Granulocytes: 0 %
Lymphocytes Relative: 30 %
Lymphs Abs: 2.7 10*3/uL (ref 0.7–4.0)
MCH: 24.2 pg — ABNORMAL LOW (ref 26.0–34.0)
MCHC: 31.1 g/dL (ref 30.0–36.0)
MCV: 78 fL — ABNORMAL LOW (ref 80.0–100.0)
Monocytes Absolute: 0.9 10*3/uL (ref 0.1–1.0)
Monocytes Relative: 10 %
Neutro Abs: 5.2 10*3/uL (ref 1.7–7.7)
Neutrophils Relative %: 58 %
Platelet Count: 418 10*3/uL — ABNORMAL HIGH (ref 150–400)
RBC: 5.32 MIL/uL (ref 4.22–5.81)
WBC Count: 9 10*3/uL (ref 4.0–10.5)
nRBC: 0 % (ref 0.0–0.2)

## 2020-11-02 LAB — CEA (ACCESS): CEA (CHCC): 1.79 ng/mL (ref 0.00–5.00)

## 2020-11-04 NOTE — Progress Notes (Signed)
Cardiology Office Note:   Date:  11/05/2020  NAME:  Jeff Kelley    MRN: [id number] DOB:  [birth year]   PCP:  Patient, No Pcp Per (Inactive)  Cardiologist:  Evalina Field, MD   Referring MD: No ref. provider found   Chief Complaint  Patient presents with  . Coronary Artery Disease         History of Present Illness:   Jeff Kelley is a 50 y.o. male with a hx of 2-vessel CAD, DM, HLD, seizure disorder who presents for follow-up. Admitted to [facility] for SOB and new-onset anemia in March 2022 and found to have colon CA. Diagnosed with Stage II CA that was treated with partial colectomy. Anemia has resolved. Echo showed WMA in the LAD distribution and troponin was mildly elevated. CCTA showed mild LAD disease and distal RCA disease that were positive by CT FFR. I reviewed his coronary CTA.  He has a 95% proximal mid LAD lesion.  He also has a 70 to 80% distal RCA lesion.  He has diabetes and likely is not experiencing angina due to this.  He denies any chest pain today.  He is recovering from his recent colectomy.  He reports he does get out of breath and winded but is working with physical therapy.  No chest tightness or chest pain.  EKG demonstrates sinus rhythm with nonspecific ST-T changes.  Blood pressure is severely elevated 160/106.  He was supposed to be taking metoprolol tart 25 do not think he is taking this.  He reports he is taking aspirin but is unsure about his cholesterol medication.  He likely is also out of metformin.  He has plans to see a primary care physician in June.  We discussed that his blockages are likely critical.  I recommended coronary angiography with PCI especially to the LAD.  His RCA lesion can be treated medically but likely will benefit from PCI.  He did have a wall motion abnormality seen on his echocardiogram.  This is further reason to pursue invasive angiography.  He is willing to proceed with this.  He will require no further chemotherapy for his  cancer.  He will require no further surgical treatments.  I will reach out to his surgeon just to make sure.  Problem List 1. CAD -mid LAD 70-99% (CT FFR 0.52) -distal RCA 70-99% (CT FFR 0.64) -EF 55-60% with distal apical anterior WMA 2. DM -A1c 9.6  3. Colon Adenocarcinoma  -Stage II -> treated with partial colectomy 09/2020 4. Ectatic Ascending Aorta -44 mm 09/25/2020? 5. HLD -T chol 221, LDL 166, TG 79, HDL 39 6. Seizure Disorder   Past Medical History: Past Medical History:  Diagnosis Date  . Colon cancer ([place])   . Coronary artery disease   . Diabetes mellitus without complication ([place])   . Hyperlipidemia   . Hypertension   . Seizures ([place])     Past Surgical History: Past Surgical History:  Procedure Laterality Date  . BIOPSY  09/24/2020   Procedure: BIOPSY;  Surgeon: Juanita Craver, MD;  Location: [place];  Service: Endoscopy;;  . COLONOSCOPY WITH PROPOFOL N/A 09/24/2020   Procedure: COLONOSCOPY WITH PROPOFOL;  Surgeon: Juanita Craver, MD;  Location: [facility] ENDOSCOPY;  Service: Endoscopy;  Laterality: N/A;  . ESOPHAGEAL BRUSHING  09/24/2020   Procedure: ESOPHAGEAL BRUSHING;  Surgeon: Juanita Craver, MD;  Location: [facility] ENDOSCOPY;  Service: Endoscopy;;  . ESOPHAGOGASTRODUODENOSCOPY (EGD) WITH PROPOFOL N/A 09/24/2020   Procedure: ESOPHAGOGASTRODUODENOSCOPY (EGD) WITH PROPOFOL;  Surgeon:  Juanita Craver, MD;  Location: [facility] ENDOSCOPY;  Service: Endoscopy;  Laterality: N/A;  . LAPAROSCOPIC PARTIAL COLECTOMY N/A 09/27/2020   Procedure: LAPAROSCOPIC ASSISTED  PARTIAL COLECTOMY;  Surgeon: Jovita Kussmaul, MD;  Location: [place];  Service: General;  Laterality: N/A;  . POLYPECTOMY  09/24/2020   Procedure: POLYPECTOMY;  Surgeon: Juanita Craver, MD;  Location: [place] ENDOSCOPY;  Service: Endoscopy;;    Current Medications: Current Meds  Medication Sig  . acetaminophen (TYLENOL) 500 MG tablet Take 1,000 mg by mouth every 6 (six) hours as needed for headache (pain).  . Cholecalciferol (VITAMIN D3) 125  MCG (5000 UT) CAPS Take 5,000 Units by mouth daily.  Marland Kitchen esomeprazole (NEXIUM) 20 MG capsule Take 20 mg by mouth daily at 12 noon.  . ferrous sulfate 325 (65 FE) MG tablet Take 1 tablet (325 mg total) by mouth daily with breakfast.  . lacosamide 100 MG TABS Take 1 tablet (100 mg total) by mouth 2 (two) times daily.  Marland Kitchen lamoTRIgine (LAMICTAL) 200 MG tablet Take 400 mg by mouth 2 (two) times daily.  Marland Kitchen levETIRAcetam (KEPPRA) 750 MG tablet Take 1,500 mg by mouth See admin instructions. Previous dose 2 tablets (1500 mg) by mouth twice daily - reduce dose starting 09/21/2020 - take 1 1/2 tablets (1125 mg) by mouth twice daily for 3-4 weeks then take 1 tablet (750 mg) twice daily, then consult MD for further instructions.  Marland Kitchen LORazepam (ATIVAN) 1 MG tablet Take 1 mg by mouth 2 (two) times daily.  . metFORMIN (GLUCOPHAGE) 500 MG tablet Take 1 tablet (500 mg total) by mouth 2 (two) times daily with a meal.  . Multiple Vitamin (MULTIVITAMIN WITH MINERALS) TABS tablet Take 1 tablet by mouth daily.  . nitroGLYCERIN (NITROSTAT) 0.4 MG SL tablet Place 1 tablet (0.4 mg total) under the tongue every 5 (five) minutes as needed for chest pain.  Marland Kitchen PHENobarbital (LUMINAL) 64.8 MG tablet Take 64.8 mg by mouth 2 (two) times daily.  . psyllium (HYDROCIL/METAMUCIL) 95 % PACK Take 1 packet by mouth daily.  . [DISCONTINUED] aspirin EC 81 MG EC tablet Take 1 tablet (81 mg total) by mouth daily. Swallow whole.  . [DISCONTINUED] atorvastatin (LIPITOR) 80 MG tablet Take 1 tablet (80 mg total) by mouth daily.  . [DISCONTINUED] metFORMIN (GLUCOPHAGE) 500 MG tablet Take 1 tablet (500 mg total) by mouth 2 (two) times daily with a meal.  . [DISCONTINUED] metoprolol tartrate (LOPRESSOR) 25 MG tablet Take 1 tablet (25 mg total) by mouth 2 (two) times daily.     Allergies:    Codeine and Penicillins   Social History: Social History   Socioeconomic History  . Marital status: Unknown    Spouse name: Not on file  . Number of children:  Not on file  . Years of education: Not on file  . Highest education level: Not on file  Occupational History  . Not on file  Tobacco Use  . Smoking status: Former Research scientist (life sciences)  . Smokeless tobacco: Never Used  Substance and Sexual Activity  . Alcohol use: Not Currently  . Drug use: Not Currently  . Sexual activity: Not on file  Other Topics Concern  . Not on file  Social History Narrative  . Not on file   Social Determinants of Health   Financial Resource Strain: Not on file  Food Insecurity: Not on file  Transportation Needs: Not on file  Physical Activity: Not on file  Stress: Not on file  Social Connections: Not on file  Family History: The patient's family history includes Diabetes in his mother; Heart attack in his mother; Lung cancer in his father.  ROS:   All other ROS reviewed and negative. Pertinent positives noted in the HPI.     EKGs/Labs/Other Studies Reviewed:   The following studies were personally reviewed by me today:  EKG:  EKG is ordered today.  The ekg ordered today demonstrates normal sinus rhythm heart rate 92, nonspecific ST-T changes, and was personally reviewed by me.   TTE 09/22/2020 1. Left ventricular ejection fraction by 3D volume is 56 %. The left  ventricle has normal function. The left ventricle demonstrates regional  wall motion abnormalities (see scoring diagram/findings for description).  Left ventricular diastolic parameters  are consistent with Grade II diastolic dysfunction (pseudonormalization).  2. Right ventricular systolic function is normal. The right ventricular  size is normal. Tricuspid regurgitation signal is inadequate for assessing  PA pressure.  3. The mitral valve is normal in structure. Trivial mitral valve  regurgitation. No evidence of mitral stenosis.  4. The aortic valve is grossly normal. Aortic valve regurgitation is not  visualized. No aortic stenosis is present.  5. Aortic dilatation noted. There is mild  dilatation of the aortic root,  measuring 40 mm.  6. The inferior vena cava is normal in size with greater than 50%  respiratory variability, suggesting right atrial pressure of 3 mmHg.   Recent Labs: 09/21/2020: B Natriuretic Peptide 297.0 09/23/2020: TSH 2.441 09/25/2020: ALT 12 10/05/2020: BUN <5; Creatinine, Ser 0.69; Magnesium 1.8; Potassium 3.9; Sodium 134 11/02/2020: Hemoglobin 12.9; Platelet Count 418   Recent Lipid Panel    Component Value Date/Time   CHOL 221 (H) 09/24/2020 0323   TRIG 79 09/24/2020 0323   HDL 39 (L) 09/24/2020 0323   CHOLHDL 5.7 09/24/2020 0323   VLDL 16 09/24/2020 0323   LDLCALC 166 (H) 09/24/2020 0323    Physical Exam:   VS:  BP (!) 160/106   Pulse 92   Ht 5\' 6"  (1.676 m)   Wt 182 lb (82.6 kg) Comment: per patient, "not able to stand"  SpO2 99%   BMI 29.38 kg/m    Wt Readings from Last 3 Encounters:  11/05/20 182 lb (82.6 kg)  10/18/20 183 lb 9.6 oz (83.3 kg)  10/04/20 199 lb 1.2 oz (90.3 kg)    General: Well nourished, well developed, in no acute distress Head: Atraumatic, normal size  Eyes: PEERLA, EOMI  Neck: Supple, no JVD Endocrine: No thryomegaly Cardiac: Normal S1, S2; RRR; no murmurs, rubs, or gallops Lungs: Clear to auscultation bilaterally, no wheezing, rhonchi or rales  Abd: Soft, nontender, no hepatomegaly  Ext: No edema, pulses 2+ Musculoskeletal: No deformities, BUE and BLE strength normal and equal Skin: Warm and dry, no rashes   Neuro: Alert and oriented to person, place, time, and situation, CNII-XII grossly intact, no focal deficits  Psych: Normal mood and affect   ASSESSMENT:   ZARIAN FENNELL is a 50 y.o. male who presents for the following: 1. Abnormal cardiac CT angiography   2. Coronary artery disease involving native coronary artery of native heart without angina pectoris   3. Mixed hyperlipidemia   4. Type 2 diabetes mellitus with complication, without long-term current use of insulin ([place])     PLAN:   1.  Abnormal cardiac CT angiography 2. Coronary artery disease involving native coronary artery of native heart without angina pectoris 3. Mixed hyperlipidemia -Admitted to the hospital in March.  He was profoundly anemic and  found to have colon cancer.  This is stage IIa.  He underwent colectomy and is currently without any cancer.  He will not undergo chemotherapy.  No further surgical treatments are planned. -He had a non-STEMI which was likely demand ischemia in the setting of profound anemia.  He was found to have two-vessel CAD including the proximal to mid LAD as well as the distal RCA.  On my review of the coronary CTA he likely has a critical stenosis in the LAD. -He surprisingly has no symptoms of angina.  I suspect this is related to uncontrolled diabetes. -Given his diabetes and proximal LAD disease I have recommended coronary angiography with PCI.  We will plan to do this in the next 1 to 2 weeks.  He will continue his aspirin and statin medication.  He was started back on metoprolol tartrate 25 twice daily.  He was also given a prescription for sublingual nitroglycerin and given strict return precautions.  Risk and benefits of coronary angiography and PCI were explained. -I reached out to his oncologist who has no planned procedures.  I have also reached out to his surgeon to ensure there are no planned procedures as he will need to be on uninterrupted DAPT.  It appears his anemia has resolved and this would not be an issue. -We will continue with statin.  Plan to recheck a lipid profile at some point in near future.  4. Type 2 diabetes mellitus with complication, without long-term current use of insulin (HCC) -Restart metformin 500 mg twice daily.  He will need to get established with a primary care physician moving forward.  5. Ascending Aortic Aneurysm -Tricuspid aortic valve.  This will need a yearly CT.  If no change we will stop.  We will check this in 1 year.  Disposition: Return in  about 6 weeks (around 12/17/2020).   Shared Decision Making/Informed Consent The risks [stroke (1 in 1000), death (1 in 1000), kidney failure [usually temporary] (1 in 500), bleeding (1 in 200), allergic reaction [possibly serious] (1 in 200)], benefits (diagnostic support and management of coronary artery disease) and alternatives of a cardiac catheterization were discussed in detail with Mr. Dohner and he is willing to proceed.  Medication Adjustments/Labs and Tests Ordered: Current medicines are reviewed at length with the patient today.  Concerns regarding medicines are outlined above.  Orders Placed This Encounter  Procedures  . CBC  . Basic metabolic panel  . EKG 12-Lead   Meds ordered this encounter  Medications  . metFORMIN (GLUCOPHAGE) 500 MG tablet    Sig: Take 1 tablet (500 mg total) by mouth 2 (two) times daily with a meal.    Dispense:  60 tablet    Refill:  11  . nitroGLYCERIN (NITROSTAT) 0.4 MG SL tablet    Sig: Place 1 tablet (0.4 mg total) under the tongue every 5 (five) minutes as needed for chest pain.    Dispense:  90 tablet    Refill:  3  . DISCONTD: aspirin 81 MG EC tablet    Sig: Take 1 tablet (81 mg total) by mouth daily. Swallow whole.    Dispense:  30 tablet    Refill:  11  . DISCONTD: metoprolol tartrate (LOPRESSOR) 25 MG tablet    Sig: Take 1 tablet (25 mg total) by mouth 2 (two) times daily.    Dispense:  30 tablet    Refill:  0  . DISCONTD: atorvastatin (LIPITOR) 80 MG tablet    Sig: Take 1  tablet (80 mg total) by mouth daily.    Dispense:  30 tablet    Refill:  3  . aspirin 81 MG EC tablet    Sig: Take 1 tablet (81 mg total) by mouth daily. Swallow whole.    Dispense:  30 tablet    Refill:  11  . atorvastatin (LIPITOR) 80 MG tablet    Sig: Take 1 tablet (80 mg total) by mouth daily.    Dispense:  30 tablet    Refill:  3  . metoprolol tartrate (LOPRESSOR) 25 MG tablet    Sig: Take 1 tablet (25 mg total) by mouth 2 (two) times daily.     Dispense:  30 tablet    Refill:  0    Patient Instructions  Medication Instructions:  Take Nitroglycerin as instructed. Start Metformin 500 mg twice daily (hold day of procedure and 48 hours after)  *If you need a refill on your cardiac medications before your next appointment, please call your pharmacy*   Lab Work: BMET, CBC today  COVID TESTING: 04/27 at 12:05 PM  If you have labs (blood work) drawn today and your tests are completely normal, you will receive your results only by: Marland Kitchen MyChart Message (if you have MyChart) OR . A paper copy in the mail If you have any lab test that is abnormal or we need to change your treatment, we will call you to review the results.   Testing/Procedures: Your physician has requested that you have a cardiac catheterization. Cardiac catheterization is used to diagnose and/or treat various heart conditions. Doctors may recommend this procedure for a number of different reasons. The most common reason is to evaluate chest pain. Chest pain can be a symptom of coronary artery disease (CAD), and cardiac catheterization can show whether plaque is narrowing or blocking your heart's arteries. This procedure is also used to evaluate the valves, as well as measure the blood flow and oxygen levels in different parts of your heart. For further information please visit [email or web address]. Please follow instruction sheet, as given.   Follow-Up: At [facility] you and your health needs are our priority.  As part of our continuing mission to provide you with exceptional heart care, we have created designated Provider Care Teams.  These Care Teams include your primary Cardiologist (physician) and Advanced Practice Providers (APPs -  Physician Assistants and Nurse Practitioners) who all work together to provide you with the care you need, when you need it.  We recommend signing up for the patient portal called "MyChart".  Sign up information is provided on this After  Visit Summary.  MyChart is used to connect with patients for Virtual Visits (Telemedicine).  Patients are able to view lab/test results, encounter notes, upcoming appointments, etc.  Non-urgent messages can be sent to your provider as well.   To learn more about what you can do with MyChart, go to [email or web address].    Your next appointment:   6 week(s)  The format for your next appointment:   In Person  Provider:   You may see Evalina Field, MD or one of the following Advanced Practice Providers on your designated Care Team:    Almyra Deforest, PA-C  Fabian Sharp, [place] or   Roby Lofts, [place]    Other Instructions    [address] [place] [address] Dept: [phone] Loc: [place]  11/05/2020  You are scheduled for  a Cardiac Catheterization on Friday, April 29 with Dr. Daneen Schick.  1. Please arrive at the [facility] (Main Entrance A) at [facility]: [address] [place], [place] [address] at 5:30 AM (This time is two hours before your procedure to ensure your preparation). Free valet parking service is available.   Special note: Every effort is made to have your procedure done on time. Please understand that emergencies sometimes delay scheduled procedures.  2. Diet: Do not eat solid foods after midnight.  The patient may have clear liquids until 5am upon the day of the procedure.  3. Labs: You will need to have blood drawn today- along with COVID TESTING.   4. Medication instructions in preparation for your procedure:   Contrast Allergy: No   Do not take Diabetes Med Glucophage (Metformin) on the day of the procedure and HOLD 48 HOURS AFTER THE PROCEDURE.  On the morning of your procedure, take your Aspirin and any morning medicines NOT listed above.  You may use sips of water.  5. Plan for one night stay--bring personal  belongings. 6. Bring a current list of your medications and current insurance cards. 7. You MUST have a responsible person to drive you home. 8. Someone MUST be with you the first 24 hours after you arrive home or your discharge will be delayed. 9. Please wear clothes that are easy to get on and off and wear slip-on shoes.  Thank you for allowing [place] to care for you!   -- [facility]      Time Spent with Patient: I have spent a total of 35 minutes with patient reviewing hospital notes, telemetry, EKGs, labs and examining the patient as well as establishing an assessment and plan that was discussed with the patient.  > 50% of time was spent in direct patient care.  Signed, Addison Naegeli. Audie Box, MD, [place]  [address] [place], [place] [address] [phone]  11/05/2020 10:25 AM

## 2020-11-04 NOTE — H&P (View-Only) (Signed)
Cardiology Office Note:   Date:  11/05/2020  NAME:  Jeff Kelley    MRN: [id number] DOB:  11-26-1970   PCP:  Patient, No Pcp Per (Inactive)  Cardiologist:  Jeff Field, MD   Referring MD: No ref. provider found   Chief Complaint  Patient presents with  . Coronary Artery Disease         History of Present Illness:   Jeff Kelley is a 50 y.o. male with a hx of 2-vessel CAD, DM, HLD, seizure disorder who presents for follow-up. Admitted to [facility] for SOB and new-onset anemia in March 2022 and found to have colon CA. Diagnosed with Stage II CA that was treated with partial colectomy. Anemia has resolved. Echo showed WMA in the LAD distribution and troponin was mildly elevated. CCTA showed mild LAD disease and distal RCA disease that were positive by CT FFR. I reviewed his coronary CTA.  He has a 95% proximal mid LAD lesion.  He also has a 70 to 80% distal RCA lesion.  He has diabetes and likely is not experiencing angina due to this.  He denies any chest pain today.  He is recovering from his recent colectomy.  He reports he does get out of breath and winded but is working with physical therapy.  No chest tightness or chest pain.  EKG demonstrates sinus rhythm with nonspecific ST-T changes.  Blood pressure is severely elevated 160/106.  He was supposed to be taking metoprolol tart 25 do not think he is taking this.  He reports he is taking aspirin but is unsure about his cholesterol medication.  He likely is also out of metformin.  He has plans to see a primary care physician in June.  We discussed that his blockages are likely critical.  I recommended coronary angiography with PCI especially to the LAD.  His RCA lesion can be treated medically but likely will benefit from PCI.  He did have a wall motion abnormality seen on his echocardiogram.  This is further reason to pursue invasive angiography.  He is willing to proceed with this.  He will require no further chemotherapy for his  cancer.  He will require no further surgical treatments.  I will reach out to his surgeon just to make sure.  Problem List 1. CAD -mid LAD 70-99% (CT FFR 0.52) -distal RCA 70-99% (CT FFR 0.64) -EF 55-60% with distal apical anterior WMA 2. DM -A1c 9.6  3. Colon Adenocarcinoma  -Stage II -> treated with partial colectomy 09/2020 4. Ectatic Ascending Aorta -44 mm 09/25/2020? 5. HLD -T chol 221, LDL 166, TG 79, HDL 39 6. Seizure Disorder   Past Medical History: Past Medical History:  Diagnosis Date  . Colon cancer ([place])   . Coronary artery disease   . Diabetes mellitus without complication ([place])   . Hyperlipidemia   . Hypertension   . Seizures ([place])     Past Surgical History: Past Surgical History:  Procedure Laterality Date  . BIOPSY  09/24/2020   Procedure: BIOPSY;  Surgeon: Juanita Craver, MD;  Location: [place];  Service: Endoscopy;;  . COLONOSCOPY WITH PROPOFOL N/A 09/24/2020   Procedure: COLONOSCOPY WITH PROPOFOL;  Surgeon: Juanita Craver, MD;  Location: [facility] ENDOSCOPY;  Service: Endoscopy;  Laterality: N/A;  . ESOPHAGEAL BRUSHING  09/24/2020   Procedure: ESOPHAGEAL BRUSHING;  Surgeon: Juanita Craver, MD;  Location: [facility] ENDOSCOPY;  Service: Endoscopy;;  . ESOPHAGOGASTRODUODENOSCOPY (EGD) WITH PROPOFOL N/A 09/24/2020   Procedure: ESOPHAGOGASTRODUODENOSCOPY (EGD) WITH PROPOFOL;  Surgeon:  Juanita Craver, MD;  Location: [facility] ENDOSCOPY;  Service: Endoscopy;  Laterality: N/A;  . LAPAROSCOPIC PARTIAL COLECTOMY N/A 09/27/2020   Procedure: LAPAROSCOPIC ASSISTED  PARTIAL COLECTOMY;  Surgeon: Jovita Kussmaul, MD;  Location: [place];  Service: General;  Laterality: N/A;  . POLYPECTOMY  09/24/2020   Procedure: POLYPECTOMY;  Surgeon: Juanita Craver, MD;  Location: [place] ENDOSCOPY;  Service: Endoscopy;;    Current Medications: Current Meds  Medication Sig  . acetaminophen (TYLENOL) 500 MG tablet Take 1,000 mg by mouth every 6 (six) hours as needed for headache (pain).  . Cholecalciferol (VITAMIN D3) 125  MCG (5000 UT) CAPS Take 5,000 Units by mouth daily.  Marland Kitchen esomeprazole (NEXIUM) 20 MG capsule Take 20 mg by mouth daily at 12 noon.  . ferrous sulfate 325 (65 FE) MG tablet Take 1 tablet (325 mg total) by mouth daily with breakfast.  . lacosamide 100 MG TABS Take 1 tablet (100 mg total) by mouth 2 (two) times daily.  Marland Kitchen lamoTRIgine (LAMICTAL) 200 MG tablet Take 400 mg by mouth 2 (two) times daily.  Marland Kitchen levETIRAcetam (KEPPRA) 750 MG tablet Take 1,500 mg by mouth See admin instructions. Previous dose 2 tablets (1500 mg) by mouth twice daily - reduce dose starting 09/21/2020 - take 1 1/2 tablets (1125 mg) by mouth twice daily for 3-4 weeks then take 1 tablet (750 mg) twice daily, then consult MD for further instructions.  Marland Kitchen LORazepam (ATIVAN) 1 MG tablet Take 1 mg by mouth 2 (two) times daily.  . metFORMIN (GLUCOPHAGE) 500 MG tablet Take 1 tablet (500 mg total) by mouth 2 (two) times daily with a meal.  . Multiple Vitamin (MULTIVITAMIN WITH MINERALS) TABS tablet Take 1 tablet by mouth daily.  . nitroGLYCERIN (NITROSTAT) 0.4 MG SL tablet Place 1 tablet (0.4 mg total) under the tongue every 5 (five) minutes as needed for chest pain.  Marland Kitchen PHENobarbital (LUMINAL) 64.8 MG tablet Take 64.8 mg by mouth 2 (two) times daily.  . psyllium (HYDROCIL/METAMUCIL) 95 % PACK Take 1 packet by mouth daily.  . [DISCONTINUED] aspirin EC 81 MG EC tablet Take 1 tablet (81 mg total) by mouth daily. Swallow whole.  . [DISCONTINUED] atorvastatin (LIPITOR) 80 MG tablet Take 1 tablet (80 mg total) by mouth daily.  . [DISCONTINUED] metFORMIN (GLUCOPHAGE) 500 MG tablet Take 1 tablet (500 mg total) by mouth 2 (two) times daily with a meal.  . [DISCONTINUED] metoprolol tartrate (LOPRESSOR) 25 MG tablet Take 1 tablet (25 mg total) by mouth 2 (two) times daily.     Allergies:    Codeine and Penicillins   Social History: Social History   Socioeconomic History  . Marital status: Unknown    Spouse name: Not on file  . Number of children:  Not on file  . Years of education: Not on file  . Highest education level: Not on file  Occupational History  . Not on file  Tobacco Use  . Smoking status: Former Research scientist (life sciences)  . Smokeless tobacco: Never Used  Substance and Sexual Activity  . Alcohol use: Not Currently  . Drug use: Not Currently  . Sexual activity: Not on file  Other Topics Concern  . Not on file  Social History Narrative  . Not on file   Social Determinants of Health   Financial Resource Strain: Not on file  Food Insecurity: Not on file  Transportation Needs: Not on file  Physical Activity: Not on file  Stress: Not on file  Social Connections: Not on file  Family History: The patient's family history includes Diabetes in his mother; Heart attack in his mother; Lung cancer in his father.  ROS:   All other ROS reviewed and negative. Pertinent positives noted in the HPI.     EKGs/Labs/Other Studies Reviewed:   The following studies were personally reviewed by me today:  EKG:  EKG is ordered today.  The ekg ordered today demonstrates normal sinus rhythm heart rate 92, nonspecific ST-T changes, and was personally reviewed by me.   TTE 09/22/2020 1. Left ventricular ejection fraction by 3D volume is 56 %. The left  ventricle has normal function. The left ventricle demonstrates regional  wall motion abnormalities (see scoring diagram/findings for description).  Left ventricular diastolic parameters  are consistent with Grade II diastolic dysfunction (pseudonormalization).  2. Right ventricular systolic function is normal. The right ventricular  size is normal. Tricuspid regurgitation signal is inadequate for assessing  PA pressure.  3. The mitral valve is normal in structure. Trivial mitral valve  regurgitation. No evidence of mitral stenosis.  4. The aortic valve is grossly normal. Aortic valve regurgitation is not  visualized. No aortic stenosis is present.  5. Aortic dilatation noted. There is mild  dilatation of the aortic root,  measuring 40 mm.  6. The inferior vena cava is normal in size with greater than 50%  respiratory variability, suggesting right atrial pressure of 3 mmHg.   Recent Labs: 09/21/2020: B Natriuretic Peptide 297.0 09/23/2020: TSH 2.441 09/25/2020: ALT 12 10/05/2020: BUN <5; Creatinine, Ser 0.69; Magnesium 1.8; Potassium 3.9; Sodium 134 11/02/2020: Hemoglobin 12.9; Platelet Count 418   Recent Lipid Panel    Component Value Date/Time   CHOL 221 (H) 09/24/2020 0323   TRIG 79 09/24/2020 0323   HDL 39 (L) 09/24/2020 0323   CHOLHDL 5.7 09/24/2020 0323   VLDL 16 09/24/2020 0323   LDLCALC 166 (H) 09/24/2020 0323    Physical Exam:   VS:  BP (!) 160/106   Pulse 92   Ht 5\' 6"  (1.676 m)   Wt 182 lb (82.6 kg) Comment: per patient, "not able to stand"  SpO2 99%   BMI 29.38 kg/m    Wt Readings from Last 3 Encounters:  11/05/20 182 lb (82.6 kg)  10/18/20 183 lb 9.6 oz (83.3 kg)  10/04/20 199 lb 1.2 oz (90.3 kg)    General: Well nourished, well developed, in no acute distress Head: Atraumatic, normal size  Eyes: PEERLA, EOMI  Neck: Supple, no JVD Endocrine: No thryomegaly Cardiac: Normal S1, S2; RRR; no murmurs, rubs, or gallops Lungs: Clear to auscultation bilaterally, no wheezing, rhonchi or rales  Abd: Soft, nontender, no hepatomegaly  Ext: No edema, pulses 2+ Musculoskeletal: No deformities, BUE and BLE strength normal and equal Skin: Warm and dry, no rashes   Neuro: Alert and oriented to person, place, time, and situation, CNII-XII grossly intact, no focal deficits  Psych: Normal mood and affect   ASSESSMENT:   Jeff Kelley is a 50 y.o. male who presents for the following: 1. Abnormal cardiac CT angiography   2. Coronary artery disease involving native coronary artery of native heart without angina pectoris   3. Mixed hyperlipidemia   4. Type 2 diabetes mellitus with complication, without long-term current use of insulin ([place])     PLAN:   1.  Abnormal cardiac CT angiography 2. Coronary artery disease involving native coronary artery of native heart without angina pectoris 3. Mixed hyperlipidemia -Admitted to the hospital in March.  He was profoundly anemic and  found to have colon cancer.  This is stage IIa.  He underwent colectomy and is currently without any cancer.  He will not undergo chemotherapy.  No further surgical treatments are planned. -He had a non-STEMI which was likely demand ischemia in the setting of profound anemia.  He was found to have two-vessel CAD including the proximal to mid LAD as well as the distal RCA.  On my review of the coronary CTA he likely has a critical stenosis in the LAD. -He surprisingly has no symptoms of angina.  I suspect this is related to uncontrolled diabetes. -Given his diabetes and proximal LAD disease I have recommended coronary angiography with PCI.  We will plan to do this in the next 1 to 2 weeks.  He will continue his aspirin and statin medication.  He was started back on metoprolol tartrate 25 twice daily.  He was also given a prescription for sublingual nitroglycerin and given strict return precautions.  Risk and benefits of coronary angiography and PCI were explained. -I reached out to his oncologist who has no planned procedures.  I have also reached out to his surgeon to ensure there are no planned procedures as he will need to be on uninterrupted DAPT.  It appears his anemia has resolved and this would not be an issue. -We will continue with statin.  Plan to recheck a lipid profile at some point in near future.  4. Type 2 diabetes mellitus with complication, without long-term current use of insulin (HCC) -Restart metformin 500 mg twice daily.  He will need to get established with a primary care physician moving forward.  5. Ascending Aortic Aneurysm -Tricuspid aortic valve.  This will need a yearly CT.  If no change we will stop.  We will check this in 1 year.  Disposition: Return in  about 6 weeks (around 12/17/2020).   Shared Decision Making/Informed Consent The risks [stroke (1 in 1000), death (1 in 1000), kidney failure [usually temporary] (1 in 500), bleeding (1 in 200), allergic reaction [possibly serious] (1 in 200)], benefits (diagnostic support and management of coronary artery disease) and alternatives of a cardiac catheterization were discussed in detail with Mr. Zamir and he is willing to proceed.  Medication Adjustments/Labs and Tests Ordered: Current medicines are reviewed at length with the patient today.  Concerns regarding medicines are outlined above.  Orders Placed This Encounter  Procedures  . CBC  . Basic metabolic panel  . EKG 12-Lead   Meds ordered this encounter  Medications  . metFORMIN (GLUCOPHAGE) 500 MG tablet    Sig: Take 1 tablet (500 mg total) by mouth 2 (two) times daily with a meal.    Dispense:  60 tablet    Refill:  11  . nitroGLYCERIN (NITROSTAT) 0.4 MG SL tablet    Sig: Place 1 tablet (0.4 mg total) under the tongue every 5 (five) minutes as needed for chest pain.    Dispense:  90 tablet    Refill:  3  . DISCONTD: aspirin 81 MG EC tablet    Sig: Take 1 tablet (81 mg total) by mouth daily. Swallow whole.    Dispense:  30 tablet    Refill:  11  . DISCONTD: metoprolol tartrate (LOPRESSOR) 25 MG tablet    Sig: Take 1 tablet (25 mg total) by mouth 2 (two) times daily.    Dispense:  30 tablet    Refill:  0  . DISCONTD: atorvastatin (LIPITOR) 80 MG tablet    Sig: Take 1  tablet (80 mg total) by mouth daily.    Dispense:  30 tablet    Refill:  3  . aspirin 81 MG EC tablet    Sig: Take 1 tablet (81 mg total) by mouth daily. Swallow whole.    Dispense:  30 tablet    Refill:  11  . atorvastatin (LIPITOR) 80 MG tablet    Sig: Take 1 tablet (80 mg total) by mouth daily.    Dispense:  30 tablet    Refill:  3  . metoprolol tartrate (LOPRESSOR) 25 MG tablet    Sig: Take 1 tablet (25 mg total) by mouth 2 (two) times daily.     Dispense:  30 tablet    Refill:  0    Patient Instructions  Medication Instructions:  Take Nitroglycerin as instructed. Start Metformin 500 mg twice daily (hold day of procedure and 48 hours after)  *If you need a refill on your cardiac medications before your next appointment, please call your pharmacy*   Lab Work: BMET, CBC today  COVID TESTING: 04/27 at 12:05 PM  If you have labs (blood work) drawn today and your tests are completely normal, you will receive your results only by: Marland Kitchen MyChart Message (if you have MyChart) OR . A paper copy in the mail If you have any lab test that is abnormal or we need to change your treatment, we will call you to review the results.   Testing/Procedures: Your physician has requested that you have a cardiac catheterization. Cardiac catheterization is used to diagnose and/or treat various heart conditions. Doctors may recommend this procedure for a number of different reasons. The most common reason is to evaluate chest pain. Chest pain can be a symptom of coronary artery disease (CAD), and cardiac catheterization can show whether plaque is narrowing or blocking your heart's arteries. This procedure is also used to evaluate the valves, as well as measure the blood flow and oxygen levels in different parts of your heart. For further information please visit HugeFiesta.[email or web address]. Please follow instruction sheet, as given.   Follow-Up: At [facility] you and your health needs are our priority.  As part of our continuing mission to provide you with exceptional heart care, we have created designated Provider Care Teams.  These Care Teams include your primary Cardiologist (physician) and Advanced Practice Providers (APPs -  Physician Assistants and Nurse Practitioners) who all work together to provide you with the care you need, when you need it.  We recommend signing up for the patient portal called "MyChart".  Sign up information is provided on this After  Visit Summary.  MyChart is used to connect with patients for Virtual Visits (Telemedicine).  Patients are able to view lab/test results, encounter notes, upcoming appointments, etc.  Non-urgent messages can be sent to your provider as well.   To learn more about what you can do with MyChart, go to [email or web address].    Your next appointment:   6 week(s)  The format for your next appointment:   In Person  Provider:   You may see Jeff Field, MD or one of the following Advanced Practice Providers on your designated Care Team:    Almyra Deforest, PA-C  Fabian Sharp, [place] or   Roby Lofts, [place]    Other Instructions    [address] [place] [address] Dept: [phone] Loc: [place]  11/05/2020  You are scheduled for  a Cardiac Catheterization on Friday, April 29 with Dr. Daneen Schick.  1. Please arrive at the [facility] (Main Entrance A) at [facility]: [address] [place], [place] [address] at 5:30 AM (This time is two hours before your procedure to ensure your preparation). Free valet parking service is available.   Special note: Every effort is made to have your procedure done on time. Please understand that emergencies sometimes delay scheduled procedures.  2. Diet: Do not eat solid foods after midnight.  The patient may have clear liquids until 5am upon the day of the procedure.  3. Labs: You will need to have blood drawn today- along with COVID TESTING.   4. Medication instructions in preparation for your procedure:   Contrast Allergy: No   Do not take Diabetes Med Glucophage (Metformin) on the day of the procedure and HOLD 48 HOURS AFTER THE PROCEDURE.  On the morning of your procedure, take your Aspirin and any morning medicines NOT listed above.  You may use sips of water.  5. Plan for one night stay--bring personal  belongings. 6. Bring a current list of your medications and current insurance cards. 7. You MUST have a responsible person to drive you home. 8. Someone MUST be with you the first 24 hours after you arrive home or your discharge will be delayed. 9. Please wear clothes that are easy to get on and off and wear slip-on shoes.  Thank you for allowing [place] to care for you!   -- [facility] [facility]      Time Spent with Patient: I have spent a total of 35 minutes with patient reviewing hospital notes, telemetry, EKGs, labs and examining the patient as well as establishing an assessment and plan that was discussed with the patient.  > 50% of time was spent in direct patient care.  Signed, Addison Naegeli. Audie Box, MD, [place]  [address] [place], [place] [address] [phone]  11/05/2020 10:25 AM

## 2020-11-05 ENCOUNTER — Encounter: Payer: Self-pay | Admitting: Cardiovascular Disease

## 2020-11-05 ENCOUNTER — Other Ambulatory Visit: Payer: Self-pay

## 2020-11-05 ENCOUNTER — Ambulatory Visit: Payer: Medicare HMO | Admitting: Cardiovascular Disease

## 2020-11-05 VITALS — BP 160/106 | HR 92 | Ht 66.0 in | Wt 182.0 lb

## 2020-11-05 DIAGNOSIS — E118 Type 2 diabetes mellitus with unspecified complications: Secondary | ICD-10-CM

## 2020-11-05 DIAGNOSIS — E782 Mixed hyperlipidemia: Secondary | ICD-10-CM | POA: Diagnosis not present

## 2020-11-05 DIAGNOSIS — I2510 Atherosclerotic heart disease of native coronary artery without angina pectoris: Secondary | ICD-10-CM

## 2020-11-05 DIAGNOSIS — R931 Abnormal findings on diagnostic imaging of heart and coronary circulation: Secondary | ICD-10-CM

## 2020-11-05 LAB — CBC
Hematocrit: 40 % (ref 37.5–51.0)
Hemoglobin: 12.6 g/dL — ABNORMAL LOW (ref 13.0–17.7)
MCH: 24.5 pg — ABNORMAL LOW (ref 26.6–33.0)
MCHC: 31.5 g/dL (ref 31.5–35.7)
MCV: 78 fL — ABNORMAL LOW (ref 79–97)
Platelets: 433 10*3/uL (ref 150–450)
RBC: 5.15 x10E6/uL (ref 4.14–5.80)
WBC: 9.2 10*3/uL (ref 3.4–10.8)

## 2020-11-05 LAB — BASIC METABOLIC PANEL
BUN/Creatinine Ratio: 20 (ref 9–20)
BUN: 13 mg/dL (ref 6–24)
CO2: 23 mmol/L (ref 20–29)
Calcium: 10 mg/dL (ref 8.7–10.2)
Chloride: 97 mmol/L (ref 96–106)
Creatinine, Ser: 0.65 mg/dL — ABNORMAL LOW (ref 0.76–1.27)
Glucose: 101 mg/dL — ABNORMAL HIGH (ref 65–99)
Potassium: 4.7 mmol/L (ref 3.5–5.2)
Sodium: 136 mmol/L (ref 134–144)
eGFR: 115 mL/min/{1.73_m2} (ref >59)

## 2020-11-05 LAB — CEA (IN HOUSE-CHCC): CEA (CHCC-In House): <1 ng/mL (ref 0.00–5.00)

## 2020-11-05 MED ORDER — ATORVASTATIN CALCIUM 80 MG PO TABS: 80.0000 mg | ORAL_TABLET | Freq: Every day | ORAL | 3 refills | Status: DC

## 2020-11-05 MED ORDER — METFORMIN HCL 500 MG PO TABS: 500.0000 mg | ORAL_TABLET | Freq: Two times a day (BID) | ORAL | 11 refills | Status: DC

## 2020-11-05 MED ORDER — METOPROLOL TARTRATE 25 MG PO TABS: 25.0000 mg | ORAL_TABLET | Freq: Two times a day (BID) | ORAL | 0 refills | Status: DC

## 2020-11-05 MED ORDER — ASPIRIN 81 MG PO TBEC: 81.0000 mg | DELAYED_RELEASE_TABLET | Freq: Every day | ORAL | 11 refills | Status: DC

## 2020-11-05 MED ORDER — NITROGLYCERIN 0.4 MG SL SUBL: 0.4000 mg | SUBLINGUAL_TABLET | SUBLINGUAL | 3 refills | Status: AC | PRN

## 2020-11-05 NOTE — Patient Instructions (Addendum)
Medication Instructions:  Take Nitroglycerin as instructed. Start Metformin 500 mg twice daily (hold day of procedure and 48 hours after)  *If you need a refill on your cardiac medications before your next appointment, please call your pharmacy*   Lab Work: BMET, CBC today  COVID TESTING: 04/27 at 12:05 PM  If you have labs (blood work) drawn today and your tests are completely normal, you will receive your results only by: Marland Kitchen MyChart Message (if you have MyChart) OR . A paper copy in the mail If you have any lab test that is abnormal or we need to change your treatment, we will call you to review the results.   Testing/Procedures: Your physician has requested that you have a cardiac catheterization. Cardiac catheterization is used to diagnose and/or treat various heart conditions. Doctors may recommend this procedure for a number of different reasons. The most common reason is to evaluate chest pain. Chest pain can be a symptom of coronary artery disease (CAD), and cardiac catheterization can show whether plaque is narrowing or blocking your heart's arteries. This procedure is also used to evaluate the valves, as well as measure the blood flow and oxygen levels in different parts of your heart. For further information please visit HugeFiesta.tn. Please follow instruction sheet, as given.   Follow-Up: At [facility], you and your health needs are our priority.  As part of our continuing mission to provide you with exceptional heart care, we have created designated Provider Care Teams.  These Care Teams include your primary Cardiologist (physician) and Advanced Practice Providers (APPs -  Physician Assistants and Nurse Practitioners) who all work together to provide you with the care you need, when you need it.  We recommend signing up for the patient portal called "MyChart".  Sign up information is provided on this After Visit Summary.  MyChart is used to connect with patients for  Virtual Visits (Telemedicine).  Patients are able to view lab/test results, encounter notes, upcoming appointments, etc.  Non-urgent messages can be sent to your provider as well.   To learn more about what you can do with MyChart, go to [email or web address].    Your next appointment:   6 week(s)  The format for your next appointment:   In Person  Provider:   You may see Evalina Field, MD or one of the following Advanced Practice Providers on your designated Care Team:    Almyra Deforest, PA-C  Fabian Sharp, [place] or   Roby Lofts, [place]    Other Instructions    [place] [address] Dept: [phone] Loc: [place]  11/05/2020  You are scheduled for a Cardiac Catheterization on Friday, April 29 with Dr. Daneen Schick.  1. Please arrive at the [facility] (Main Entrance A) at [facility]: [address] [place], [place] [address] at 5:30 AM (This time is two hours before your procedure to ensure your preparation). Free valet parking service is available.   Special note: Every effort is made to have your procedure done on time. Please understand that emergencies sometimes delay scheduled procedures.  2. Diet: Do not eat solid foods after midnight.  The patient may have clear liquids until 5am upon the day of the procedure.  3. Labs: You will need to have blood drawn today- along with COVID TESTING.   4. Medication instructions in preparation for your procedure:   Contrast Allergy: No  Do not take Diabetes Med Glucophage (Metformin) on the day of the procedure and HOLD 48 HOURS AFTER THE PROCEDURE.  On the morning of your procedure, take your Aspirin and any morning medicines NOT listed above.  You may use sips of water.  5. Plan for one night stay--bring personal belongings. 6. Bring a current list of your medications and current  insurance cards. 7. You MUST have a responsible person to drive you home. 8. Someone MUST be with you the first 24 hours after you arrive home or your discharge will be delayed. 9. Please wear clothes that are easy to get on and off and wear slip-on shoes.  Thank you for allowing [place] to care for you!   -- [place] [facility]

## 2020-11-07 ENCOUNTER — Other Ambulatory Visit (HOSPITAL_COMMUNITY)
Admission: RE | Admit: 2020-11-07 | Discharge: 2020-11-07 | Disposition: A | Discharge status: Home / Self Care | Payer: Medicare HMO | Source: Ambulatory Visit | Attending: Interventional Cardiology | Admitting: Interventional Cardiology

## 2020-11-07 DIAGNOSIS — Z01812 Encounter for preprocedural laboratory examination: Secondary | ICD-10-CM | POA: Diagnosis present

## 2020-11-07 DIAGNOSIS — Z20822 Contact with and (suspected) exposure to covid-19: Secondary | ICD-10-CM | POA: Insufficient documentation

## 2020-11-07 LAB — SARS CORONAVIRUS 2 (TAT 6-24 HRS): SARS Coronavirus 2: NEGATIVE

## 2020-11-08 ENCOUNTER — Telehealth: Payer: Self-pay | Admitting: *Deleted

## 2020-11-08 NOTE — Telephone Encounter (Signed)
Pt contacted pre-catheterization scheduled at [facility] for: Friday November 09, 2020 7:30 AM Verified arrival time and place: [place] [facility]) at: 5:30 AM   No solid food after midnight prior to cath, clear liquids until 5 AM day of procedure.  Hold: Metformin-day of procedure and 48 hours post procedure  Except hold medications AM meds can be  taken pre-cath with sips of water including: ASA 81 mg   Confirmed patient has responsible adult to drive home post procedure and be with patient first 24 hours after arriving home: yes  You are allowed ONE visitor in the waiting room during the time you are at the hospital for your procedure. Both you and your visitor must wear a mask once you enter the hospital.   Reviewed procedure/mask/visitor instructions with patient.

## 2020-11-09 ENCOUNTER — Encounter (HOSPITAL_COMMUNITY): Payer: Self-pay | Admitting: Interventional Cardiology

## 2020-11-09 ENCOUNTER — Encounter (HOSPITAL_COMMUNITY)
Admission: RE | Disposition: A | Discharge status: Home / Self Care | Payer: Self-pay | Source: Home / Self Care | Attending: Interventional Cardiology

## 2020-11-09 ENCOUNTER — Other Ambulatory Visit: Payer: Self-pay

## 2020-11-09 ENCOUNTER — Ambulatory Visit (HOSPITAL_COMMUNITY)
Admission: RE | Admit: 2020-11-09 | Discharge: 2020-11-09 | Disposition: A | Discharge status: Home / Self Care | Payer: Medicare HMO | Attending: Interventional Cardiology | Admitting: Interventional Cardiology

## 2020-11-09 DIAGNOSIS — E119 Type 2 diabetes mellitus without complications: Secondary | ICD-10-CM | POA: Diagnosis not present

## 2020-11-09 DIAGNOSIS — E782 Mixed hyperlipidemia: Secondary | ICD-10-CM | POA: Diagnosis not present

## 2020-11-09 DIAGNOSIS — D649 Anemia, unspecified: Secondary | ICD-10-CM | POA: Diagnosis present

## 2020-11-09 DIAGNOSIS — I712 Thoracic aortic aneurysm, without rupture: Secondary | ICD-10-CM | POA: Diagnosis not present

## 2020-11-09 DIAGNOSIS — Z880 Allergy status to penicillin: Secondary | ICD-10-CM | POA: Diagnosis not present

## 2020-11-09 DIAGNOSIS — Z7984 Long term (current) use of oral hypoglycemic drugs: Secondary | ICD-10-CM | POA: Insufficient documentation

## 2020-11-09 DIAGNOSIS — R778 Other specified abnormalities of plasma proteins: Secondary | ICD-10-CM | POA: Diagnosis present

## 2020-11-09 DIAGNOSIS — I2510 Atherosclerotic heart disease of native coronary artery without angina pectoris: Secondary | ICD-10-CM | POA: Diagnosis not present

## 2020-11-09 DIAGNOSIS — Z885 Allergy status to narcotic agent status: Secondary | ICD-10-CM | POA: Insufficient documentation

## 2020-11-09 DIAGNOSIS — R7989 Other specified abnormal findings of blood chemistry: Secondary | ICD-10-CM | POA: Diagnosis present

## 2020-11-09 DIAGNOSIS — I509 Heart failure, unspecified: Secondary | ICD-10-CM

## 2020-11-09 DIAGNOSIS — Z79899 Other long term (current) drug therapy: Secondary | ICD-10-CM | POA: Insufficient documentation

## 2020-11-09 DIAGNOSIS — Z87891 Personal history of nicotine dependence: Secondary | ICD-10-CM | POA: Insufficient documentation

## 2020-11-09 DIAGNOSIS — R931 Abnormal findings on diagnostic imaging of heart and coronary circulation: Secondary | ICD-10-CM | POA: Diagnosis present

## 2020-11-09 HISTORY — PX: LEFT HEART CATH AND CORONARY ANGIOGRAPHY: CATH118249

## 2020-11-09 LAB — GLUCOSE, CAPILLARY
Glucose-Capillary: 114 mg/dL — ABNORMAL HIGH (ref 70–99)
Glucose-Capillary: 95 mg/dL (ref 70–99)

## 2020-11-09 SURGERY — LEFT HEART CATH AND CORONARY ANGIOGRAPHY
Anesthesia: LOCAL

## 2020-11-09 MED ORDER — ACETAMINOPHEN 325 MG PO TABS: 650.0000 mg | ORAL_TABLET | ORAL | Status: DC | PRN

## 2020-11-09 MED ORDER — SODIUM CHLORIDE 0.9 % WEIGHT BASED INFUSION
3.0000 mL/kg/h | INTRAVENOUS | Status: DC
  Administered 2020-11-09: 3 mL/kg/h via INTRAVENOUS

## 2020-11-09 MED ORDER — HEPARIN SODIUM (PORCINE) 1000 UNIT/ML IJ SOLN
INTRAMUSCULAR | Status: AC
  Filled 2020-11-09: qty 1

## 2020-11-09 MED ORDER — ONDANSETRON HCL 4 MG/2ML IJ SOLN: 4.0000 mg | Freq: Four times a day (QID) | INTRAMUSCULAR | Status: DC | PRN

## 2020-11-09 MED ORDER — LABETALOL HCL 5 MG/ML IV SOLN: 10.0000 mg | INTRAVENOUS | Status: DC | PRN

## 2020-11-09 MED ORDER — SODIUM CHLORIDE 0.9% FLUSH: 3.0000 mL | INTRAVENOUS | Status: DC | PRN

## 2020-11-09 MED ORDER — HEPARIN (PORCINE) IN NACL 1000-0.9 UT/500ML-% IV SOLN
INTRAVENOUS | Status: AC
  Filled 2020-11-09: qty 1000

## 2020-11-09 MED ORDER — FENTANYL CITRATE (PF) 100 MCG/2ML IJ SOLN
INTRAMUSCULAR | Status: DC | PRN
  Administered 2020-11-09: 50 ug via INTRAVENOUS

## 2020-11-09 MED ORDER — MIDAZOLAM HCL 2 MG/2ML IJ SOLN
INTRAMUSCULAR | Status: AC
  Filled 2020-11-09: qty 2

## 2020-11-09 MED ORDER — HEPARIN SODIUM (PORCINE) 1000 UNIT/ML IJ SOLN
INTRAMUSCULAR | Status: DC | PRN
  Administered 2020-11-09: 4000 [IU] via INTRAVENOUS

## 2020-11-09 MED ORDER — SODIUM CHLORIDE 0.9 % IV SOLN: INTRAVENOUS | Status: DC

## 2020-11-09 MED ORDER — FENTANYL CITRATE (PF) 100 MCG/2ML IJ SOLN
INTRAMUSCULAR | Status: AC
  Filled 2020-11-09: qty 2

## 2020-11-09 MED ORDER — SODIUM CHLORIDE 0.9 % IV SOLN: 250.0000 mL | INTRAVENOUS | Status: DC | PRN

## 2020-11-09 MED ORDER — IOHEXOL 350 MG/ML SOLN
INTRAVENOUS | Status: DC | PRN
  Administered 2020-11-09: 90 mL via INTRA_ARTERIAL

## 2020-11-09 MED ORDER — LIDOCAINE HCL (PF) 1 % IJ SOLN
INTRAMUSCULAR | Status: AC
  Filled 2020-11-09: qty 30

## 2020-11-09 MED ORDER — SODIUM CHLORIDE 0.9% FLUSH: 3.0000 mL | Freq: Two times a day (BID) | INTRAVENOUS | Status: DC

## 2020-11-09 MED ORDER — VERAPAMIL HCL 2.5 MG/ML IV SOLN
INTRAVENOUS | Status: DC | PRN
  Administered 2020-11-09: 10 mL via INTRA_ARTERIAL

## 2020-11-09 MED ORDER — MIDAZOLAM HCL 2 MG/2ML IJ SOLN
INTRAMUSCULAR | Status: DC | PRN
  Administered 2020-11-09: 1 mg via INTRAVENOUS

## 2020-11-09 MED ORDER — HEPARIN (PORCINE) IN NACL 1000-0.9 UT/500ML-% IV SOLN
INTRAVENOUS | Status: DC | PRN
  Administered 2020-11-09 (×2): 500 mL

## 2020-11-09 MED ORDER — LIDOCAINE HCL (PF) 1 % IJ SOLN
INTRAMUSCULAR | Status: DC | PRN
  Administered 2020-11-09: 2 mL via INTRADERMAL

## 2020-11-09 MED ORDER — ASPIRIN 81 MG PO CHEW: 81.0000 mg | CHEWABLE_TABLET | ORAL | Status: DC

## 2020-11-09 MED ORDER — VERAPAMIL HCL 2.5 MG/ML IV SOLN
INTRAVENOUS | Status: AC
  Filled 2020-11-09: qty 2

## 2020-11-09 MED ORDER — ASPIRIN 81 MG PO CHEW: 81.0000 mg | CHEWABLE_TABLET | Freq: Every day | ORAL | Status: DC

## 2020-11-09 MED ORDER — SODIUM CHLORIDE 0.9 % WEIGHT BASED INFUSION: 1.0000 mL/kg/h | INTRAVENOUS | Status: DC

## 2020-11-09 MED ORDER — HYDRALAZINE HCL 20 MG/ML IJ SOLN: 10.0000 mg | INTRAMUSCULAR | Status: DC | PRN

## 2020-11-09 SURGICAL SUPPLY — 10 items
CATH 5FR JL3.5 JR4 ANG PIG MP (CATHETERS) ×2 IMPLANT
DEVICE RAD COMP TR BAND LRG (VASCULAR PRODUCTS) ×2 IMPLANT
GLIDESHEATH SLEND A-KIT 6F 22G (SHEATH) ×2 IMPLANT
GUIDEWIRE INQWIRE 1.5J.035X260 (WIRE) ×1 IMPLANT
INQWIRE 1.5J .035X260CM (WIRE) ×2
KIT HEART LEFT (KITS) ×2 IMPLANT
PACK CARDIAC CATHETERIZATION (CUSTOM PROCEDURE TRAY) ×2 IMPLANT
SHEATH PROBE COVER 6X72 (BAG) ×2 IMPLANT
TRANSDUCER W/STOPCOCK (MISCELLANEOUS) ×2 IMPLANT
TUBING CIL FLEX 10 FLL-RA (TUBING) ×2 IMPLANT

## 2020-11-09 NOTE — Interval H&P Note (Signed)
Cath Lab Visit (complete for each Cath Lab visit)  Clinical Evaluation Leading to the Procedure:   ACS: No.  Non-ACS:    Anginal Classification: CCS III  Anti-ischemic medical therapy: Minimal Therapy (1 class of medications)  Non-Invasive Test Results: No non-invasive testing performed  Prior CABG: No previous CABG      History and Physical Interval Note:  11/09/2020 7:23 AM  Jeff Kelley  has presented today for surgery, with the diagnosis of abnormal cardiac ct.  The various methods of treatment have been discussed with the patient and family. After consideration of risks, benefits and other options for treatment, the patient has consented to  Procedure(s): LEFT HEART CATH AND CORONARY ANGIOGRAPHY (N/A) as a surgical intervention.  The patient's history has been reviewed, patient examined, no change in status, stable for surgery.  I have reviewed the patient's chart and labs.  Questions were answered to the patient's satisfaction.     Jeff Kelley III

## 2020-11-09 NOTE — CV Procedure (Signed)
   Severe three-vessel coronary disease equivalent.  Circumflex is very small and widely patent.  First diagonal is very large and branching, supplying much of the typical territory of the circumflex.  He contains 75% ostial narrowing and mid 75 to 80% stenosis.  Proximal to mid LAD contains a segmental 80% stenosis.  Right coronary is dominant and contains 80% distal followed by 85% distal stenosis.  There is 70% stenosis in the continuation of the right coronary prior to the origin of the second left ventricular branch.  Normal LV function.  EF 55%.  LVEDP less than 12 mmHg.  Tough situation in this 50 year old diabetic recently undergoing resection of colon cancer, currently debilitated, and now being found to have multivessel CAD from which is best long-term outcome will be multivessel CABG with arterial grafting.  Will discuss with Dr. Farris Has.  My initial thought is aggressive medical therapy until fully recovered physically and then elective CABG.  Aggressive preventive therapy including aspirin, high intensity statin therapy, beta-blocker therapy for anti-ischemic protection, and addition of SGLT2 therapy to decrease potential for cardiac events until surgery can be done.

## 2020-11-09 NOTE — Discharge Instructions (Signed)
Drink plenty of fluids for 48 hours and keep wrist elevated at heart level for 24 hours  Radial Site Care   This sheet gives you information about how to care for yourself after your procedure. Your health care provider may also give you more specific instructions. If you have problems or questions, contact your health care provider. What can I expect after the procedure? After the procedure, it is common to have:  Bruising and tenderness at the catheter insertion area. Follow these instructions at home: Medicines  Take over-the-counter and prescription medicines only as told by your health care provider. Insertion site care 1. Follow instructions from your health care provider about how to take care of your insertion site. Make sure you: ? Wash your hands with soap and water before you change your bandage (dressing). If soap and water are not available, use hand sanitizer. ? Remove your dressing as told by your health care provider. In 24 hours 2. Check your insertion site every day for signs of infection. Check for: ? Redness, swelling, or pain. ? Fluid or blood. ? Pus or a bad smell. ? Warmth. 3. Do not take baths, swim, or use a hot tub until your health care provider approves. 4. You may shower 24-48 hours after the procedure, or as directed by your health care provider. ? Remove the dressing and gently wash the site with plain soap and water. ? Pat the area dry with a clean towel. ? Do not rub the site. That could cause bleeding. 5. Do not apply powder or lotion to the site. Activity   1. For 24 hours after the procedure, or as directed by your health care provider: ? Do not flex or bend the affected arm. ? Do not push or pull heavy objects with the affected arm. ? Do not drive yourself home from the hospital or clinic. You may drive 24 hours after the procedure unless your health care provider tells you not to. ? Do not operate machinery or power tools. 2. Do not lift  anything that is heavier than 10 lb (4.5 kg), or the limit that you are told, until your health care provider says that it is safe.  For 4 days 3. Ask your health care provider when it is okay to: ? Return to work or school. ? Resume usual physical activities or sports. ? Resume sexual activity. General instructions  If the catheter site starts to bleed, raise your arm and put firm pressure on the site. If the bleeding does not stop, get help right away. This is a medical emergency.  If you went home on the same day as your procedure, a responsible adult should be with you for the first 24 hours after you arrive home.  Keep all follow-up visits as told by your health care provider. This is important. Contact a health care provider if:  You have a fever.  You have redness, swelling, or yellow drainage around your insertion site. Get help right away if:  You have unusual pain at the radial site.  The catheter insertion area swells very fast.  The insertion area is bleeding, and the bleeding does not stop when you hold steady pressure on the area.  Your arm or hand becomes pale, cool, tingly, or numb. These symptoms may represent a serious problem that is an emergency. Do not wait to see if the symptoms will go away. Get medical help right away. Call your local emergency services (911 in the [place] Do   not drive yourself to the hospital. Summary  After the procedure, it is common to have bruising and tenderness at the site.  Follow instructions from your health care provider about how to take care of your radial site wound. Check the wound every day for signs of infection.  Do not lift anything that is heavier than 10 lb (4.5 kg), or the limit that you are told, until your health care provider says that it is safe. This information is not intended to replace advice given to you by your health care provider. Make sure you discuss any questions you have with your health care  provider. Document Revised: 08/05/2017 Document Reviewed: 08/05/2017 Elsevier Patient Education  2020 Elsevier Inc.  

## 2020-11-12 ENCOUNTER — Other Ambulatory Visit: Payer: Self-pay | Admitting: Cardiovascular Disease

## 2020-11-16 ENCOUNTER — Encounter: Payer: Self-pay | Admitting: Oncology

## 2020-11-16 LAB — GUARDANT 360

## 2020-11-22 ENCOUNTER — Telehealth: Payer: Self-pay | Admitting: Cardiovascular Disease

## 2020-11-22 NOTE — Telephone Encounter (Signed)
Patient was calling back to say he hasnt heard anything from Dr Marisue Ivan he was told when he had his procedure done on 4/29 the Dr. Then told him that he would get the information over to [facility]. Patient was calling for that follow up. Please advise

## 2020-11-22 NOTE — Telephone Encounter (Signed)
Returned call to patient who states that he was wanting to hear back from Dr. Audie Box regarding his next steps and the plan after his recent Heart Cath on 4/29. Advised patient that I would forward message to Dr. Audie Box.   Advised patient to call back to office with any issues, questions, or concerns. Patient verbalized understanding.

## 2020-11-23 NOTE — Telephone Encounter (Signed)
Almyra Free:  Get him in to see me the first day I am back in clinic. Overbook is fine.   [place] T. Audie Box, MD, [place]  [address], [place], [place] [address] [phone]  7:27 AM

## 2020-11-26 ENCOUNTER — Telehealth: Payer: Self-pay | Admitting: *Deleted

## 2020-11-26 NOTE — Telephone Encounter (Signed)
-----   Message from Ladell Pier, MD sent at 11/26/2020  8:36 AM EDT ----- Please call patient, circulating DNA test is negative for cancer, f/u as scheduled

## 2020-11-26 NOTE — Telephone Encounter (Signed)
Notified patient that [place] [facility] result was negative for circulating DNA of cancer. F/U as scheduled.

## 2020-11-27 ENCOUNTER — Telehealth: Payer: Self-pay | Admitting: Licensed Clinical Social Worker

## 2020-11-27 NOTE — Telephone Encounter (Signed)
Patient called back. I offered to have him be seen next week- patient states it would have to be on a Friday as his girlfriend drives him and that is the only day she can bring him. I did not have any appointments available for any Fridays. I did offer other times and days, but patient states he will have to call back if he is able to have someone bring him. I did advise of our transportation options, but patient lives far away and does not know if he is eligible for this or not. I reached out to [place] to verify if we could help assist in getting him to any appointment next week for his Cath follow up.  Will continue to follow.

## 2020-11-27 NOTE — Telephone Encounter (Signed)
Patient was reached out to by Michiel Cowboy, he was able to get transportation and spoke to me to make appointment to be seen on Dec 05, 2020 with Dr.[name].  Patient verbalized understanding.  Thankful for call back.

## 2020-11-27 NOTE — Telephone Encounter (Signed)
I attempted to contact patient to get him scheduled.  Patient did not answer, LVM to call back.  Left call back number.

## 2020-11-27 NOTE — Telephone Encounter (Signed)
LCSW called pt per referral from [place], [place]. Pt in need of ride to upcoming appt. I called and was able to reach him at [phone]. Introduced self, role, reason for call. Pt shared that he is working on getting a ride through his girlfriend's family member. He has had some challenges w/ mobility and is working with PT. I shared that we could still assist with transportation and provided pt with my name and number if his ride does not pan out. I also shared that he could utilize our [employer] if he needs future rides to [place] [employer]. Benay Spice. I remain available to assist pt as needed.   Westley Hummer, MSW, [place]  [phone]

## 2020-12-03 NOTE — Progress Notes (Signed)
Cardiology Office Note:   Date:  12/05/2020  NAME:  [name]    MRN: [id number] DOB:  07-Jul-1971   PCP:  Patient, No Pcp Per (Inactive)  Cardiologist:  Evalina Field, MD  Electrophysiologist:  None   Referring MD: No ref. provider found   Chief Complaint  Patient presents with  . Coronary Artery Disease   History of Present Illness:   Jeff Kelley is a 50 y.o. male with a hx of severe 2-vessel CAD, DM, Colon CA who presents for follow-up. Needs referral to CT surgery.  He is doing well since left heart catheterization.  Right radial cath site clean and dry.  He is not having any chest pain.  He presents with a friend.  He is now in a wheelchair.  He reports he is weak and is not walking much.  He was seen by his neurologist who believes this is a blood flow problem.  He has 2+ lower extremity pulses.  He has no evidence of PAD that I can tell.  His BP is 119/82.  Denies any chest pain or shortness of breath.  We do need to get him over to cardiac surgery for CABG evaluation.  We will go ahead and order preoperative carotid ultrasounds.  He is not a smoker.  Do not think he needs PFTs.  Problem List 1. CAD -85% mid LAD -80% D1 -80% distal RCA -EF 55-60% with distal apical anterior WMA 2. DM -A1c 9.6  3. Colon Adenocarcinoma  -Stage II -> treated with partial colectomy 09/2020 4. Ectatic Ascending Aorta -44 mm 09/25/2020? -40 mm on Cardiac CT 5. HLD -T chol 221, LDL 166, TG 79, HDL 39 6. Seizure Disorder   [place] Past Medical History: Past Medical History:  Diagnosis Date  . Colon cancer ([place])   . Coronary artery disease   . Diabetes mellitus without complication ([place])   . Hyperlipidemia   . Hypertension   . Seizures ([place])     Past Surgical History: Past Surgical History:  Procedure Laterality Date  . BIOPSY  09/24/2020   Procedure: BIOPSY;  Surgeon: Juanita Craver, MD;  Location: [facility];  Service: Endoscopy;;  . COLONOSCOPY WITH PROPOFOL N/A 09/24/2020    Procedure: COLONOSCOPY WITH PROPOFOL;  Surgeon: Juanita Craver, MD;  Location: [facility] ENDOSCOPY;  Service: Endoscopy;  Laterality: N/A;  . ESOPHAGEAL BRUSHING  09/24/2020   Procedure: ESOPHAGEAL BRUSHING;  Surgeon: Juanita Craver, MD;  Location: [facility] END[facility]COPY;  Service: Endoscopy;;  . ESOPHAGOGASTRODUODENOSCOPY (EGD) WITH PROPOFOL N/A 09/24/2020   Procedure: ESOPHAGOGASTRODUODENOSCOPY (EGD) WITH PROPOFOL;  Surgeon: Juanita Craver, MD;  Location: [facility] ENDOSCOPY;  Service: Endoscopy;  Laterality: N/A;  . LAPAROSCOPIC PARTIAL COLECTOMY N/A 09/27/2020   Procedure: LAPAROSCOPIC ASSISTED  PARTIAL COLECTOMY;  Surgeon: Jovita Kussmaul, MD;  Location: [facility];  Service: General;  Laterality: N/A;  . LEFT HEART CATH AND CORONARY ANGIOGRAPHY N/A 11/09/2020   Procedure: LEFT HEART CATH AND CORONARY ANGIOGRAPHY;  Surgeon: Belva Crome, MD;  Location: [facility] CV LAB;  Service: Cardiovascular;  Laterality: N/A;  . POLYPECTOMY  09/24/2020   Procedure: POLYPECTOMY;  Surgeon: Juanita Craver, MD;  Location: [facility] ENDOSCOPY;  Service: Endoscopy;;    Current Medications: Current Meds  Medication Sig  . acetaminophen (TYLENOL) 500 MG tablet Take 1,000 mg by mouth every 6 (six) hours as needed for headache (pain).  Jeff Kelley aspirin 81 MG EC tablet Take 1 tablet (81 mg total) by mouth daily. Swallow whole.  Jeff Kelley atorvastatin (LIPITOR) 80 MG tablet  Take 1 tablet (80 mg total) by mouth daily.  . Cenobamate (XCOPRI) 100 MG TABS Take 100 mg by mouth daily.  . Cholecalciferol (VITAMIN D3) 125 MCG (5000 UT) CAPS Take 5,000 Units by mouth daily.  Jeff Kelley esomeprazole (NEXIUM) 20 MG capsule Take 20 mg by mouth daily.  . ferrous sulfate 325 (65 FE) MG tablet Take 1 tablet (325 mg total) by mouth daily with breakfast.  . lamoTRIgine (LAMICTAL) 200 MG tablet Take 400 mg by mouth 2 (two) times daily.  Jeff Kelley levETIRAcetam (KEPPRA) 750 MG tablet Take 750 mg by mouth daily. Previous dose 2 tablets (1500 mg) by mouth twice daily - reduce dose starting 09/21/2020 - take 1  1/2 tablets (1125 mg) by mouth twice daily for 3-4 weeks then take 1 tablet (750 mg) twice daily, then consult MD for further instructions.  Jeff Kelley LORazepam (ATIVAN) 1 MG tablet Take 1 mg by mouth 2 (two) times daily.  . metFORMIN (GLUCOPHAGE) 500 MG tablet Take 1 tablet (500 mg total) by mouth 2 (two) times daily with a meal. (Patient taking differently: Take 500 mg by mouth daily with breakfast.)  . metoprolol tartrate (LOPRESSOR) 25 MG tablet TAKE 1 TABLET BY MOUTH TWICE A DAY  . Multiple Vitamin (MULTIVITAMIN WITH MINERALS) TABS tablet Take 1 tablet by mouth daily.  . nitroGLYCERIN (NITROSTAT) 0.4 MG SL tablet Place 1 tablet (0.4 mg total) under the tongue every 5 (five) minutes as needed for chest pain.  Jeff Kelley PHENobarbital (LUMINAL) 64.8 MG tablet Take 64.8 mg by mouth 2 (two) times daily.  . psyllium (HYDROCIL/METAMUCIL) 95 % PACK Take 1 packet by mouth daily.     Allergies:    Codeine and Penicillins   Social History: Social History   Socioeconomic History  . Marital status: Unknown    Spouse name: Not on file  . Number of children: Not on file  . Years of education: Not on file  . Highest education level: Not on file  Occupational History  . Not on file  Tobacco Use  . Smoking status: Former Games developer  . Smokeless tobacco: Never Used  Substance and Sexual Activity  . Alcohol use: Not Currently  . Drug use: Not Currently  . Sexual activity: Not on file  Other Topics Concern  . Not on file  Social History Narrative  . Not on file   Social Determinants of Health   Financial Resource Strain: Not on file  Food Insecurity: Not on file  Transportation Needs: Not on file  Physical Activity: Not on file  Stress: Not on file  Social Connections: Not on file     Family History: The patient's family history includes Diabetes in his mother; Heart attack in his mother; Lung cancer in his father.  ROS:   All other ROS reviewed and negative. Pertinent positives noted in the HPI.      EKGs/Labs/Other Studies Reviewed:   The following studies were personally reviewed by me today:  LHC 11/09/2020   Normal left main  Severe proximal to mid LAD stenosis.  Large first diagonal has tandem stenoses with 70% ostial and 80% mid.  There is a branching vessel that supplies the typical distribution of the ramus intermedius or large first obtuse marginal.  Circumflex is small and has no significant obstruction.    RCA is dominant, contains tandem 70 to 80% stenoses distally.  The continuation before the small third left ventricular branches 70%.  Normal left ventricular function with EF 55%.  LVEDP is normal.  Recent Labs: 09/21/2020: B Natriuretic Peptide 297.0 09/23/2020: TSH 2.441 09/25/2020: ALT 12 10/05/2020: Magnesium 1.8 11/05/2020: BUN 13; Creatinine, Ser 0.65; Hemoglobin 12.6; Platelets 433; Potassium 4.7; Sodium 136   Recent Lipid Panel    Component Value Date/Time   CHOL 221 (H) 09/24/2020 0323   TRIG 79 09/24/2020 0323   HDL 39 (L) 09/24/2020 0323   CHOLHDL 5.7 09/24/2020 0323   VLDL 16 09/24/2020 0323   LDLCALC 166 (H) 09/24/2020 0323    Physical Exam:   VS:  BP 119/82   Pulse 80   SpO2 96%    Wt Readings from Last 3 Encounters:  11/09/20 (P) 182 lb (82.6 kg)  11/05/20 182 lb (82.6 kg)  10/18/20 183 lb 9.6 oz (83.3 kg)    General: Well nourished, well developed, in no acute distress Head: Atraumatic, normal size  Eyes: PEERLA, EOMI  Neck: Supple, no JVD Endocrine: No thryomegaly Cardiac: Normal S1, S2; RRR; no murmurs, rubs, or gallops Lungs: Clear to auscultation bilaterally, no wheezing, rhonchi or rales  Abd: Soft, nontender, no hepatomegaly  Ext: No edema, pulses 2+ Musculoskeletal: No deformities, BUE and BLE strength normal and equal Skin: Warm and dry, no rashes   Neuro: Alert and oriented to person, place, time, and situation, CNII-XII grossly intact, no focal deficits  Psych: Normal mood and affect   ASSESSMENT:   Jeff Kelley is a 50 y.o. male who presents for the following: 1. Coronary artery disease involving native coronary artery of native heart without angina pectoris   2. Mixed hyperlipidemia   3. Bruit     PLAN:   1. Coronary artery disease involving native coronary artery of native heart without angina pectoris 2. Mixed hyperlipidemia 3. Bruit -Left heart catheterization with severe proximal to mid LAD stenosis.  He also has an 80% first diagonal lesion.  He also has a 70 to 80% distal RCA lesion.  He has a small circumflex lesion.  He essentially has multivessel CAD. -He is diabetic.  Interventional cardiology felt he would be better served by CABG. -Normal LV function. -We will set him up to see Dr. Susa Loffler for CABG evaluation. -He may need repeat imaging of his aorta.  His initial chest CT from the hospital on 09/24/2020 demonstrated an aortic aneurysm of 44 mm.  I imagine the cardiac CTA did not view as high on the ascending aorta to see this.  My thoughts would be he just have CABG.  We will need to have this evaluation by cardiac surgery. -He will need preoperative carotid ultrasounds.  He is a never smoker.  I see no need for PFTs.  He will likely need dental clearance as well. -He informs me he has been weak since leaving the hospital.  He is now in a wheelchair.  He is not doing that much activity.  This may preclude him from cardiac surgery.  I will reach out to his neurologist [facility], fax [phone]) for further evaluation.  Apparently he was told he may have a blood vessel problem.  He has 2+ pulses in lower extremities.  I do not think this is related to a vascular problem. -Regarding his colon cancer he has been diagnosed with stage II colon cancer.  He is status post removal of the tumor.  This appears to be limited and no further treatment is needed.  He does not need chemotherapy.  He will not require further surgeries.  I have reached out to both his oncologist  and surgeon who have also informed me that he needs no further treatment.  This should not preclude him from cardiac surgery moving forward.  His weakness may preclude him but currently not his cancer.  Disposition: Return in about 3 months (around 03/07/2021).  Medication Adjustments/Labs and Tests Ordered: Current medicines are reviewed at length with the patient today.  Concerns regarding medicines are outlined above.  Orders Placed This Encounter  Procedures  . Ambulatory referral to Cardiothoracic Surgery  . VAS US CAROTID   No orders of the defined types were placed in this encounter.   Patient Instructions  Medication Instructions:  The current medical regimen is effective;  continue present plan and medications.  *If you need a refill on your cardiac medications before your next appointment, please call your pharmacy*   Testing/Procedures: Your physician has requested that you have a carotid duplex. This test is an ultrasound of the carotid arteries in your neck. It looks at blood flow through these arteries that supply the brain with blood. Allow one hour for this exam. There are no restrictions or special instructions.   Follow-Up: At [facility], you and your health needs are our priority.  As part of our continuing mission to provide you with exceptional heart care, we have created designated Provider Care Teams.  These Care Teams include your primary Cardiologist (physician) and Advanced Practice Providers (APPs -  Physician Assistants and Nurse Practitioners) who all work together to provide you with the care you need, when you need it.  We recommend signing up for the patient portal called "MyChart".  Sign up information is provided on this After Visit Summary.  MyChart is used to connect with patients for Virtual Visits (Telemedicine).  Patients are able to view lab/test results, encounter notes, upcoming appointments, etc.  Non-urgent messages can be sent to your  provider as well.   To learn more about what you can do with MyChart, go to [email or web address].    Your next appointment:   3 month(s)  The format for your next appointment:   In Person  Provider:   Eleonore Chiquito, MD   Other Instructions Referral to [facility]- they will contact you to get set up for an appointment.       Time Spent with Patient: I have spent a total of 35 minutes with patient reviewing hospital notes, telemetry, EKGs, labs and examining the patient as well as establishing an assessment and plan that was discussed with the patient.  > 50% of time was spent in direct patient care.  Signed, Addison Naegeli. Audie Box, MD, [place]  [address] [place], [place] [address] [phone]  12/05/2020 2:06 PM

## 2020-12-05 ENCOUNTER — Ambulatory Visit (INDEPENDENT_AMBULATORY_CARE_PROVIDER_SITE_OTHER): Payer: Medicare HMO | Admitting: Cardiovascular Disease

## 2020-12-05 ENCOUNTER — Other Ambulatory Visit: Payer: Self-pay

## 2020-12-05 ENCOUNTER — Encounter: Payer: Self-pay | Admitting: Cardiovascular Disease

## 2020-12-05 VITALS — BP 119/82 | HR 80

## 2020-12-05 DIAGNOSIS — R0989 Other specified symptoms and signs involving the circulatory and respiratory systems: Secondary | ICD-10-CM | POA: Diagnosis not present

## 2020-12-05 DIAGNOSIS — I2510 Atherosclerotic heart disease of native coronary artery without angina pectoris: Secondary | ICD-10-CM | POA: Diagnosis not present

## 2020-12-05 DIAGNOSIS — E782 Mixed hyperlipidemia: Secondary | ICD-10-CM | POA: Diagnosis not present

## 2020-12-05 NOTE — Patient Instructions (Addendum)
Medication Instructions:  The current medical regimen is effective;  continue present plan and medications.  *If you need a refill on your cardiac medications before your next appointment, please call your pharmacy*   Testing/Procedures: Your physician has requested that you have a carotid duplex. This test is an ultrasound of the carotid arteries in your neck. It looks at blood flow through these arteries that supply the brain with blood. Allow one hour for this exam. There are no restrictions or special instructions.   Follow-Up: At [facility], you and your health needs are our priority.  As part of our continuing mission to provide you with exceptional heart care, we have created designated Provider Care Teams.  These Care Teams include your primary Cardiologist (physician) and Advanced Practice Providers (APPs -  Physician Assistants and Nurse Practitioners) who all work together to provide you with the care you need, when you need it.  We recommend signing up for the patient portal called "MyChart".  Sign up information is provided on this After Visit Summary.  MyChart is used to connect with patients for Virtual Visits (Telemedicine).  Patients are able to view lab/test results, encounter notes, upcoming appointments, etc.  Non-urgent messages can be sent to your provider as well.   To learn more about what you can do with MyChart, go to [email or web address].    Your next appointment:   3 month(s)  The format for your next appointment:   In Person  Provider:   Eleonore Chiquito, MD   Other Instructions Referral to [facility]- they will contact you to get set up for an appointment.

## 2020-12-12 ENCOUNTER — Other Ambulatory Visit: Payer: Self-pay | Admitting: Thoracic Surgery (Cardiothoracic Vascular Surgery)

## 2020-12-12 ENCOUNTER — Institutional Professional Consult (permissible substitution): Payer: Medicare HMO | Admitting: Thoracic Surgery (Cardiothoracic Vascular Surgery)

## 2020-12-12 ENCOUNTER — Encounter: Payer: Self-pay | Admitting: Thoracic Surgery (Cardiothoracic Vascular Surgery)

## 2020-12-12 ENCOUNTER — Other Ambulatory Visit: Payer: Self-pay

## 2020-12-12 VITALS — BP 114/75 | HR 82 | Temp 98.1°F | Resp 20 | Ht 66.0 in | Wt 192.0 lb

## 2020-12-12 DIAGNOSIS — I2510 Atherosclerotic heart disease of native coronary artery without angina pectoris: Secondary | ICD-10-CM

## 2020-12-12 NOTE — Progress Notes (Signed)
Thanks Richardson Landry.   Almyra Free -> Let's see if we can get in touch with his neurologist. We need this weakness evaluated before CABG.   -Wes

## 2020-12-12 NOTE — Progress Notes (Signed)
Jeff Kelley -> We need to get in touch with his neurologist for evaluation of his weakness.   Jeff Bells T. Audie Box, MD, [place]  [address] [place], [place] [address] [phone]  5:54 PM

## 2020-12-12 NOTE — Progress Notes (Signed)
PCP is Patient, No Pcp Per (Inactive) Referring Provider is [name], Cassie Freer, *  Chief Complaint  Patient presents with  . Coronary Artery Disease    Surgical consult, Cardiac Cath 11/09/20, ECHO 09/22/20, CT C/A/P 09/24/20    HPI: Mr. Mcbane is sent for consultation regarding severe two-vessel coronary disease.  Hrishikesh Hoeg is a 50 year old man with a past medical history significant for hypertension, hyperlipidemia, seizures, type 2 diabetes, colon cancer, and a non-ST elevation MI.  He presented to the hospital in March with shortness of breath.  He ruled in for a non-STEMI.  He was severely anemic.  Work-up revealed a colon cancer.  He had a right colectomy for that by Dr. Marlou Starks.  He followed up with Dr. Audie Box.  A cardiac CT showed two-vessel coronary disease.  Dr. Tamala Julian did cardiac catheterization which revealed severe two-vessel disease.  He has a large dominant right with multiple stenoses and then relatively tight lesion in the proximal LAD as well as disease in a proximal first diagonal which is the dominant anterior lateral branch.  Left ventricular function was normal.  Since his colon surgery he has had weakness in his legs.  The weakness is worse in the left than the right.  He can walk a short distance with a walker.  He says he saw his neurologist recently but we do not have access to that note.  He was undergoing physical therapy but apparently that was stopped when he was discharged and now he has to go through his primary care.  He is trying to get that restarted.  He denies any chest pain, pressure, tightness, or shortness of breath.   Past Medical History:  Diagnosis Date  . Colon cancer ([place])   . Coronary artery disease   . Diabetes mellitus without complication ([place])   . Hyperlipidemia   . Hypertension   . Seizures ([place])     Past Surgical History:  Procedure Laterality Date  . BIOPSY  09/24/2020   Procedure: BIOPSY;  Surgeon: Juanita Craver, MD;  Location: [facility];  Service: Endoscopy;;  . COLONOSCOPY WITH PROPOFOL N/A 09/24/2020   Procedure: COLONOSCOPY WITH PROPOFOL;  Surgeon: Juanita Craver, MD;  Location: [facility] ENDOSCOPY;  Service: Endoscopy;  Laterality: N/A;  . ESOPHAGEAL BRUSHING  09/24/2020   Procedure: ESOPHAGEAL BRUSHING;  Surgeon: Juanita Craver, MD;  Location: [facility] ENDOSCOPY;  Service: Endoscopy;;  . ESOPHAGOGASTRODUODENOSCOPY (EGD) WITH PROPOFOL N/A 09/24/2020   Procedure: ESOPHAGOGASTRODUODENOSCOPY (EGD) WITH PROPOFOL;  Surgeon: Juanita Craver, MD;  Location: [facility] ENDOSCOPY;  Service: Endoscopy;  Laterality: N/A;  . LAPAROSCOPIC PARTIAL COLECTOMY N/A 09/27/2020   Procedure: LAPAROSCOPIC ASSISTED  PARTIAL COLECTOMY;  Surgeon: Jovita Kussmaul, MD;  Location: [facility];  Service: General;  Laterality: N/A;  . LEFT HEART CATH AND CORONARY ANGIOGRAPHY N/A 11/09/2020   Procedure: LEFT HEART CATH AND CORONARY ANGIOGRAPHY;  Surgeon: Belva Crome, MD;  Location: [facility] CV LAB;  Service: Cardiovascular;  Laterality: N/A;  . POLYPECTOMY  09/24/2020   Procedure: POLYPECTOMY;  Surgeon: Juanita Craver, MD;  Location: [facility] ENDOSCOPY;  Service: Endoscopy;;    Family History  Problem Relation Age of Onset  . Heart attack Mother   . Diabetes Mother   . Lung cancer Father     Social History Social History   Tobacco Use  . Smoking status: Former Research scientist (life sciences)  . Smokeless tobacco: Never Used  Substance Use Topics  . Alcohol use: Not Currently  . Drug use: Not Currently    Current Outpatient Medications  Medication Sig Dispense Refill  . acetaminophen (TYLENOL) 500 MG tablet Take 1,000 mg by mouth every 6 (six) hours as needed for headache (pain).    Marland Kitchen aspirin 81 MG EC tablet Take 1 tablet (81 mg total) by mouth daily. Swallow whole. 30 tablet 11  . atorvastatin (LIPITOR) 80 MG tablet Take 1 tablet (80 mg total) by mouth daily. 30 tablet 3  . Cenobamate (XCOPRI) 100 MG TABS Take 100 mg by mouth daily.    . Cholecalciferol (VITAMIN D3) 125 MCG (5000 UT) CAPS Take  5,000 Units by mouth daily.    Marland Kitchen esomeprazole (NEXIUM) 20 MG capsule Take 20 mg by mouth daily.    . ferrous sulfate 325 (65 FE) MG tablet Take 1 tablet (325 mg total) by mouth daily with breakfast. 30 tablet 0  . levETIRAcetam (KEPPRA) 750 MG tablet Take 750 mg by mouth daily. Previous dose 2 tablets (1500 mg) by mouth twice daily - reduce dose starting 09/21/2020 - take 1 1/2 tablets (1125 mg) by mouth twice daily for 3-4 weeks then take 1 tablet (750 mg) twice daily, then consult MD for further instructions.    Marland Kitchen LORazepam (ATIVAN) 1 MG tablet Take 1 mg by mouth 2 (two) times daily.    . metFORMIN (GLUCOPHAGE) 500 MG tablet Take 1 tablet (500 mg total) by mouth 2 (two) times daily with a meal. (Patient taking differently: Take 500 mg by mouth daily with breakfast.) 60 tablet 11  . metoprolol tartrate (LOPRESSOR) 25 MG tablet TAKE 1 TABLET BY MOUTH TWICE A DAY 30 tablet 10  . Multiple Vitamin (MULTIVITAMIN WITH MINERALS) TABS tablet Take 1 tablet by mouth daily.    . nitroGLYCERIN (NITROSTAT) 0.4 MG SL tablet Place 1 tablet (0.4 mg total) under the tongue every 5 (five) minutes as needed for chest pain. 90 tablet 3  . PHENobarbital (LUMINAL) 64.8 MG tablet Take 64.8 mg by mouth 2 (two) times daily.    . psyllium (HYDROCIL/METAMUCIL) 95 % PACK Take 1 packet by mouth daily. 240 each 0   No current facility-administered medications for this visit.    Allergies  Allergen Reactions  . Codeine Other (See Comments)    Patient states he was told he was allergic when he was [age] years old, unknown reaction.  Marland Kitchen Penicillins Other (See Comments)    Patient states he was told he was allergic when he was [age] years ago, unknown reaction    Review of Systems  Constitutional: Positive for activity change.  HENT: Negative for trouble swallowing and voice change.   Respiratory: Positive for shortness of breath (Resolved after correction of anemia). Negative for stridor.   Cardiovascular: Negative for  chest pain and leg swelling.  Gastrointestinal: Positive for blood in stool (Resolved after colectomy).  Genitourinary: Negative for difficulty urinating and dysuria.  Musculoskeletal: Positive for back pain and gait problem.       Muscle atrophy of calves  Neurological: Positive for weakness. Negative for syncope.  Hematological: Negative for adenopathy. Does not bruise/bleed easily.  All other systems reviewed and are negative.   BP 114/75   Pulse 82   Temp 98.1 F (36.7 C) (Skin)   Resp 20   Ht 5\' 6"  (1.676 m)   Wt 192 lb (87.1 kg)   SpO2 95% Comment: RA  BMI 30.99 kg/m  Physical Exam Vitals reviewed.  Constitutional:      General: He is not in acute distress.    Comments: 50 year old male in wheelchair  HENT:  Head: Normocephalic and atraumatic.  Eyes:     General: No scleral icterus.    Extraocular Movements: Extraocular movements intact.  Neck:     Vascular: No carotid bruit.  Cardiovascular:     Rate and Rhythm: Normal rate and regular rhythm.     Heart sounds: Normal heart sounds. No murmur heard. No friction rub. No gallop.   Pulmonary:     Effort: Pulmonary effort is normal. No respiratory distress.     Breath sounds: Normal breath sounds. No wheezing.  Abdominal:     General: There is no distension.     Palpations: Abdomen is soft.     Tenderness: There is no abdominal tenderness.  Musculoskeletal:        General: No swelling.     Cervical back: Neck supple.  Skin:    General: Skin is warm and dry.  Neurological:     General: No focal deficit present.     Mental Status: He is alert and oriented to person, place, and time.     Motor: Weakness (3 out of 5 dorsiflexion left foot,) present.    Diagnostic Tests: Cardiac catheterization 11/09/2020 Conclusion   Normal left main  Severe proximal to mid LAD stenosis.  Large first diagonal has tandem stenoses with 70% ostial and 80% mid.  There is a branching vessel that supplies the typical  distribution of the ramus intermedius or large first obtuse marginal.  Circumflex is small and has no significant obstruction.    RCA is dominant, contains tandem 70 to 80% stenoses distally.  The continuation before the small third left ventricular branches 70%.  Normal left ventricular function with EF 55%.  LVEDP is normal.  RECOMMENDATIONS:   The patient has essentially significant three-vessel coronary disease and is diabetic.  Best long-term approach in this gentleman would be coronary bypass surgery with attention to all arterial grafting if possible.  PCI would be possible in the LAD and right coronary.  The diagonal would be much more difficult to treat and is a large vessel.  Discussion concerning timing of bypass surgery and whether it is clinically appropriate and this particular patient's clinical scenario.  Decisions I am sure will be influenced by management of colon cancer.  Aggressive preventive therapy including the addition of SGLT2.  I personally reviewed the cardiac catheterization images.  There is severe two-vessel disease involving LAD, large first diagonal, and the dominant right.  Impression: Helmut Hennon is a 49 year old man with a past medical history significant for hypertension, hyperlipidemia, seizures, type 2 diabetes, colon cancer, and a non-ST elevation MI.   He presented with shortness of breath and ruled in for non-STEMI.  This was in the setting of severe anemia due to colon cancer.  He had a colon cancer resected and does not need any additional therapy for that.  He then had further work-up including a coronary CTA and cardiac catheterization.  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  I agree that the "best" option for management of his coronary disease would be coronary artery bypass grafting.    However, I am very concerned about his ability to rehab from that operation in his current state.  It is very  mysterious to me what is going on with his weakness in his legs.  I do not know if that is due to a stroke or some other underlying condition.  He does not have claudication or rest pain.  His femoral pulses are  normal.  He has a palpable posterior tibial on the left and a faintly palpable 1 on the right.  I am going to check ABIs to see if there is any major vascular insufficiency, but there is certainly not enough vascular disease to account for his weakness.  He is scheduled to have carotid duplex on Thursday, 12/14/2020.  We will try to get those done the same day.  In the meantime is not having any unstable chest pain.  I think he would benefit from some physical therapy to get him to the point where he can walk without a walker before undergoing major cardiac surgery.  If he were to start having chest pain in the meantime, it might be better to place stents in the right and LAD and manage the diagonal medically.    Plan: We will check ABIs Follow-up with neurology regarding weakness Physical therapy Will discuss with cardiology. I will have him come back to the office in about 2 weeks just to check on her progress with physical therapy and go over the results of his vascular studies.  Melrose Nakayama, MD [facility] [phone]

## 2020-12-14 ENCOUNTER — Other Ambulatory Visit: Payer: Self-pay

## 2020-12-14 ENCOUNTER — Ambulatory Visit (HOSPITAL_COMMUNITY)
Admission: RE | Admit: 2020-12-14 | Discharge: 2020-12-14 | Disposition: A | Discharge status: Home / Self Care | Payer: Medicare HMO | Source: Ambulatory Visit | Attending: Thoracic Surgery (Cardiothoracic Vascular Surgery) | Admitting: Thoracic Surgery (Cardiothoracic Vascular Surgery)

## 2020-12-14 ENCOUNTER — Encounter (HOSPITAL_COMMUNITY): Payer: Medicare HMO

## 2020-12-14 ENCOUNTER — Ambulatory Visit (INDEPENDENT_AMBULATORY_CARE_PROVIDER_SITE_OTHER)
Admission: RE | Admit: 2020-12-14 | Discharge: 2020-12-14 | Disposition: A | Discharge status: Home / Self Care | Payer: Medicare HMO | Source: Ambulatory Visit | Attending: Cardiovascular Disease | Admitting: Cardiovascular Disease

## 2020-12-14 DIAGNOSIS — I2510 Atherosclerotic heart disease of native coronary artery without angina pectoris: Secondary | ICD-10-CM | POA: Diagnosis not present

## 2020-12-14 DIAGNOSIS — R0989 Other specified symptoms and signs involving the circulatory and respiratory systems: Secondary | ICD-10-CM

## 2020-12-14 NOTE — Progress Notes (Signed)
MD spoke with Neurologist, they will evaluate and send records.

## 2020-12-17 ENCOUNTER — Telehealth: Payer: Self-pay | Admitting: *Deleted

## 2020-12-17 NOTE — Telephone Encounter (Signed)
Patient is due 2nd draw of Guard[employer] Reve[employer] testing (9-11 weeks post op). Company states draw is due to 12/31/20. Left VM for patient to call to discuss. Also sent MyChart message.

## 2020-12-18 ENCOUNTER — Other Ambulatory Visit: Payer: Self-pay

## 2020-12-18 ENCOUNTER — Telehealth: Payer: Self-pay | Admitting: Cardiovascular Disease

## 2020-12-18 DIAGNOSIS — R0989 Other specified symptoms and signs involving the circulatory and respiratory systems: Secondary | ICD-10-CM

## 2020-12-18 DIAGNOSIS — I6521 Occlusion and stenosis of right carotid artery: Secondary | ICD-10-CM

## 2020-12-18 NOTE — Telephone Encounter (Signed)
Pt c/o medication issue:  1. Name of Medication: atorvastatin (LIPITOR) 80 MG tablet  2. How are you currently taking this medication (dosage and times per day)? 1 tablet by mouth daily  3. Are you having a reaction (difficulty breathing--STAT)? no  4. What is your medication issue? dhiarre muscle pain, effecting his walking, jerking, patient stated he having every side effect there is.

## 2020-12-18 NOTE — Telephone Encounter (Signed)
Spoke with patient who reports side effects as noted below related to atorvastatin. Advised that he HOLD medication and would send message to MD to advise on plan going forward. He has not tried other statins per his report and chart review

## 2020-12-19 ENCOUNTER — Other Ambulatory Visit: Payer: Self-pay

## 2020-12-19 ENCOUNTER — Encounter: Payer: Self-pay | Admitting: *Deleted

## 2020-12-19 MED ORDER — ROSUVASTATIN CALCIUM 20 MG PO TABS: 20.0000 mg | ORAL_TABLET | Freq: Every day | ORAL | 3 refills | Status: DC

## 2020-12-19 NOTE — Telephone Encounter (Signed)
Called patient, notified of change.  RX sent to pharmacy.  Patient verbalized understanding.

## 2020-12-19 NOTE — Progress Notes (Signed)
Due Timepoint 2 on Guard[employer] Reveal testing in June. Patient would prefer the phlebotomy team come to his home to draw. Confirmed this with client services at [phone] option 1. Faxed his form w/request for "OPS" noted on top of form to phlebotomy team [phone].

## 2020-12-20 NOTE — Progress Notes (Signed)
Subjective:    Jeff Kelley - 50 y.o. male MRN [id number]  Date of birth: 02-23-1971  HPI  Jeff Kelley is to establish care. Patient has a PMH significant for congestive heart failure, coronary artery disease, symptomatic anemia, seizures, occult GI bleeding, elevated troponin, hyperglycemia, iron deficiency anemia, hyponatremia, and abnormal echocardiogram. He is accompanied by his girlfriend, Anderson Malta. Patient followed by Cardiology.    Current issues and/or concerns: DIABETES TYPE 2: Last A1C: 5.3% on 12/21/2020 Med Adherence:  '[x]'  Yes    '[]'  No Medication side effects:  '[]'  Yes    '[x]'  No Home Monitoring?  '[]'  Yes    '[x]'  No Diet Adherence: '[x]'  Yes    '[]'  No Exercise: '[]'  Yes    '[x]'  No  2. PHYSICAL THERAPY REQUEST: Reports had a fall recently when admitted to the hospital. Reports admitted to the hospital for possible heart cath. States during the cath process if was determined that he needs open heart surgery instead. Reports the cardiac surgeon will not complete any surgeries because he cannot walk.   He was receiving physical therapy during and after hospital admission. Reports orders were soon discontinued after he was discharged from hospital. Reports difficulty walking short and long distances. When he walks he does use a walker for assistance. Using a wheelchair for ambulation other times. Right arm numbness where the past cath was attempted. Difficult to push up with right arm. Requesting referral back to physical therapy.    ROS per HPI    Health Maintenance:  Health Maintenance Due  Topic Date Due   COVID-19 Vaccine (1) Never done   Pneumococcal Vaccine 57-61 Years old (1 - PCV) Never done   URINE MICROALBUMIN  Never done   Hepatitis C Screening  Never done   TETANUS/TDAP  Never done   Zoster Vaccines- Shingrix (1 of 2) Never done    Past Medical History: Patient Active Problem List   Diagnosis Date Noted   Coronary artery disease    Abnormal echocardiogram  09/23/2020   Seizures ([facility])    Occult GI bleeding    Elevated troponin    CHF (congestive heart failure) ([facility])    Hyperglycemia    Iron deficiency anemia    Hyponatremia    Symptomatic anemia 09/21/2020    Social History   reports that he has quit smoking. He has never used smokeless tobacco. He reports previous alcohol use. He reports previous drug use.   Family History  family history includes Diabetes in his mother; Heart attack in his mother; Lung cancer in his father.   Medications: reviewed and updated   Objective:   Physical Exam BP 134/82 (BP Location: Right Arm, Patient Position: Sitting, Cuff Size: Normal)   Pulse 83   Temp 98.1 F (36.7 C)   Resp 15   Ht 5' 5.98" (1.676 m)   SpO2 96%   BMI 31.00 kg/m  Physical Exam  General appearance - alert, well appearing, and in no distress and oriented to person, place, and time Mental status - alert, oriented to person, place, and time, normal mood, behavior, speech, dress, motor activity, and thought processes Eyes - pupils equal and reactive, extraocular eye movements intact Neck - supple, no significant adenopathy Chest - clear to auscultation, no wheezes, rales or rhonchi, symmetric air entry, no tachypnea, retractions or cyanosis Heart - normal rate, regular rhythm, normal S1, S2, no murmurs, rubs, clicks or gallops Neurological - alert, oriented, normal speech, no focal findings or movement disorder noted,  motor and sensory grossly normal bilaterally, normal muscle tone, no tremors, strength 5/5 bilateral upper extremities, strength 4/5 bilateral lower extremities Musculoskeletal - no joint tenderness, deformity or swelling, no muscular tenderness noted Extremities - peripheral pulses normal    Assessment & Plan:  1. Encounter to establish care: - Patient presents today to establish care.  - Return for annual physical examination, labs, and health maintenance. Arrive fasting meaning having no food for at least 8  hours prior to appointment. You may have only water or black coffee. Please take scheduled medications as normal.  2. Type 2 diabetes mellitus without complication, without long-term current use of insulin ([place]): - Hemoglobin A1c today at goal at 5.3%, goal < 7%. This is improved from previous hemoglobin A1c 9.6% on 09/22/2020. Next hemoglobin A1c due September 2022.  - Continue Metformin as prescribed.  - To achieve an A1C goal of less than or equal to 7.0 percent, a fasting blood sugar of 80 to 130 mg/dL and a postprandial glucose (90 to 120 minutes after a meal) less than 180 mg/dL. In the event of sugars less than 60 mg/dl or greater than 400 mg/dl please notify the clinic ASAP. It is recommended that you undergo annual eye exams and annual foot exams. - Discussed the importance of healthy eating habits, low-carbohydrate diet, low-sugar diet, regular aerobic exercise (at least 150 minutes a week as tolerated) and medication compliance to achieve or maintain control of diabetes. - Follow-up with primary provider in 3 months or sooner if needed.  - POCT glycosylated hemoglobin (Hb A1C) - metFORMIN (GLUCOPHAGE) 500 MG tablet; Take 1 tablet (500 mg total) by mouth 2 (two) times daily with a meal.  Dispense: 180 tablet; Refill: 0 - Blood Glucose Monitoring Suppl (TRUE METRIX METER) w/Device KIT; Use as directed  Dispense: 1 kit; Refill: 0 - TRUEplus Lancets 28G MISC; Use as directed  Dispense: 100 each; Refill: 4 - glucose blood (TRUE METRIX BLOOD GLUCOSE TEST) test strip; Use as instructed  Dispense: 100 each; Refill: 12  3. Impaired ambulation: - Referral to Physical Therapy for further evaluation and management.  - Ambulatory referral to Physical Therapy   Patient was given clear instructions to go to Emergency Department or return to medical center if symptoms don't improve, worsen, or new problems develop.The patient verbalized understanding.  I discussed the assessment and treatment plan  with the patient. The patient was provided an opportunity to ask questions and all were answered. The patient agreed with the plan and demonstrated an understanding of the instructions.   The patient was advised to call back or seek an in-person evaluation if the symptoms worsen or if the condition fails to improve as anticipated.    Durene Fruits, NP 12/21/2020, 1:16 PM Primary Care at [facility]

## 2020-12-21 ENCOUNTER — Ambulatory Visit (INDEPENDENT_AMBULATORY_CARE_PROVIDER_SITE_OTHER): Payer: Medicare HMO | Admitting: Family

## 2020-12-21 ENCOUNTER — Other Ambulatory Visit: Payer: Self-pay

## 2020-12-21 ENCOUNTER — Encounter: Payer: Self-pay | Admitting: Family

## 2020-12-21 VITALS — BP 134/82 | HR 83 | Temp 98.1°F | Resp 15 | Ht 65.98 in

## 2020-12-21 DIAGNOSIS — Z7689 Persons encountering health services in other specified circumstances: Secondary | ICD-10-CM

## 2020-12-21 DIAGNOSIS — R262 Difficulty in walking, not elsewhere classified: Secondary | ICD-10-CM

## 2020-12-21 DIAGNOSIS — E119 Type 2 diabetes mellitus without complications: Secondary | ICD-10-CM

## 2020-12-21 LAB — POCT GLYCOSYLATED HEMOGLOBIN (HGB A1C): Hemoglobin A1C: 5.3 % (ref 4.0–5.6)

## 2020-12-21 MED ORDER — TRUE METRIX METER W/DEVICE KIT
PACK | 0 refills | Status: DC
Start: 2020-12-21 — End: 2021-01-11

## 2020-12-21 MED ORDER — METFORMIN HCL 500 MG PO TABS: 500.0000 mg | ORAL_TABLET | Freq: Two times a day (BID) | ORAL | 0 refills | Status: DC

## 2020-12-21 MED ORDER — TRUE METRIX BLOOD GLUCOSE TEST VI STRP
ORAL_STRIP | 12 refills | Status: DC
Start: 2020-12-21 — End: 2021-01-11

## 2020-12-21 MED ORDER — TRUEPLUS LANCETS 28G MISC: 4 refills | Status: AC

## 2020-12-21 NOTE — Progress Notes (Signed)
Pt presents to establish care he is accompanied by girlfriend Anderson Malta  Has diabetes concerns-last A1c 9.6-3/12/22/ 12/21/20-5.3  Needs in-home PT referral

## 2020-12-28 ENCOUNTER — Ambulatory Visit: Payer: Medicare HMO | Admitting: Thoracic Surgery (Cardiothoracic Vascular Surgery)

## 2021-01-01 ENCOUNTER — Telehealth: Payer: Self-pay

## 2021-01-01 NOTE — Telephone Encounter (Signed)
I called the patient to see if he had seen Neuro yet. Patient did not answer, I left a voicemail to get back in touch with me to discuss, left call back number.   Wanted to make you aware.  Thanks!

## 2021-01-04 ENCOUNTER — Encounter: Payer: Medicare HMO | Admitting: Vascular Surgery

## 2021-01-07 ENCOUNTER — Ambulatory Visit: Payer: Medicare HMO | Admitting: Physician Assistant

## 2021-01-07 ENCOUNTER — Encounter (HOSPITAL_BASED_OUTPATIENT_CLINIC_OR_DEPARTMENT_OTHER): Payer: Self-pay

## 2021-01-09 MED ORDER — ROSUVASTATIN CALCIUM 10 MG PO TABS: 10.0000 mg | ORAL_TABLET | Freq: Every day | ORAL | 11 refills | Status: DC

## 2021-01-11 ENCOUNTER — Encounter: Payer: Self-pay | Admitting: Vascular Surgery

## 2021-01-11 ENCOUNTER — Other Ambulatory Visit: Payer: Self-pay | Admitting: Oncology

## 2021-01-11 ENCOUNTER — Ambulatory Visit: Payer: Medicare HMO | Admitting: Vascular Surgery

## 2021-01-11 ENCOUNTER — Other Ambulatory Visit: Payer: Self-pay

## 2021-01-11 VITALS — BP 117/83 | HR 67 | Temp 97.7°F | Resp 20 | Ht 66.0 in | Wt 192.0 lb

## 2021-01-11 DIAGNOSIS — I6523 Occlusion and stenosis of bilateral carotid arteries: Secondary | ICD-10-CM | POA: Diagnosis not present

## 2021-01-11 DIAGNOSIS — C182 Malignant neoplasm of ascending colon: Secondary | ICD-10-CM

## 2021-01-11 NOTE — Progress Notes (Signed)
Patient ID: [name], male   DOB: 1970-11-25, 51 y.o.   MRN: [id number]  Reason for Consult: New Patient (Initial Visit)   Referred by Geralynn Rile, *  Subjective:     HPI:  Jeff Kelley is a 50 y.o. male with history of hyperlipidemia, hypertension, diabetes and coronary artery disease.  He also has a history of seizures.  He was undergoing work-up for coronary bypass was found to have occluded carotid artery.  He denies any history of stroke.  He denies any previous history of vascular or cardiovascular disease or surgery.  He does not have any personal family history of aneurysm disease.  He currently walks with the help of walker  Past Medical History:  Diagnosis Date   Colon cancer ([place])    Coronary artery disease    Diabetes mellitus without complication ([place])    Hyperlipidemia    Hypertension    Seizures ([place])    Family History  Problem Relation Age of Onset   Heart attack Mother    Diabetes Mother    Lung cancer Father    Past Surgical History:  Procedure Laterality Date   BIOPSY  09/24/2020   Procedure: BIOPSY;  Surgeon: Juanita Craver, MD;  Location: [facility] ENDOSCOPY;  Service: Endoscopy;;   COLONOSCOPY WITH PROPOFOL N/A 09/24/2020   Procedure: COLONOSCOPY WITH PROPOFOL;  Surgeon: Juanita Craver, MD;  Location: [facility] ENDOSCOPY;  Service: Endoscopy;  Laterality: N/A;   ESOPHAGEAL BRUSHING  09/24/2020   Procedure: ESOPHAGEAL BRUSHING;  Surgeon: Juanita Craver, MD;  Location: [facility] ENDOSCOPY;  Service: Endoscopy;;   ESOPHAGOGASTRODUODENOSCOPY (EGD) WITH PROPOFOL N/A 09/24/2020   Procedure: ESOPHAGOGASTRODUODENOSCOPY (EGD) WITH PROPOFOL;  Surgeon: Juanita Craver, MD;  Location: [facility] ENDOSCOPY;  Service: Endoscopy;  Laterality: N/A;   LAPAROSCOPIC PARTIAL COLECTOMY N/A 09/27/2020   Procedure: LAPAROSCOPIC ASSISTED  PARTIAL COLECTOMY;  Surgeon: Jovita Kussmaul, MD;  Location: [place];  Service: General;  Laterality: N/A;   LEFT HEART CATH AND CORONARY ANGIOGRAPHY N/A 11/09/2020    Procedure: LEFT HEART CATH AND CORONARY ANGIOGRAPHY;  Surgeon: Belva Crome, MD;  Location: [place] CV LAB;  Service: Cardiovascular;  Laterality: N/A;   POLYPECTOMY  09/24/2020   Procedure: POLYPECTOMY;  Surgeon: Juanita Craver, MD;  Location: [facility] ENDOSCOPY;  Service: Endoscopy;;    Short Social History:  Social History   Tobacco Use   Smoking status: Former    Pack years: 0.00   Smokeless tobacco: Never  Substance Use Topics   Alcohol use: Not Currently    Allergies  Allergen Reactions   Atorvastatin     Diarrhea, muscle pain   Codeine Other (See Comments)    Patient states he was told he was allergic when he was [age] years old, unknown reaction.   Penicillins Other (See Comments)    Patient states he was told he was allergic when he was [age] years ago, unknown reaction    Current Outpatient Medications  Medication Sig Dispense Refill   acetaminophen (TYLENOL) 500 MG tablet Take 1,000 mg by mouth every 6 (six) hours as needed for headache (pain).     aspirin 81 MG EC tablet Take 1 tablet (81 mg total) by mouth daily. Swallow whole. 30 tablet 11   Cenobamate (XCOPRI) 100 MG TABS Take 100 mg by mouth daily.     Cholecalciferol (VITAMIN D3) 125 MCG (5000 UT) CAPS Take 5,000 Units by mouth daily.     esomeprazole (NEXIUM) 20 MG capsule Take 20 mg by mouth daily.  ferrous sulfate 325 (65 FE) MG tablet Take 1 tablet (325 mg total) by mouth daily with breakfast. 30 tablet 0   levETIRAcetam (KEPPRA) 750 MG tablet Take 750 mg by mouth daily. Previous dose 2 tablets (1500 mg) by mouth twice daily - reduce dose starting 09/21/2020 - take 1 1/2 tablets (1125 mg) by mouth twice daily for 3-4 weeks then take 1 tablet (750 mg) twice daily, then consult MD for further instructions.     LORazepam (ATIVAN) 1 MG tablet Take 1 mg by mouth 2 (two) times daily.     metFORMIN (GLUCOPHAGE) 500 MG tablet Take 1 tablet (500 mg total) by mouth 2 (two) times daily with a meal. 180 tablet 0    metoprolol tartrate (LOPRESSOR) 25 MG tablet TAKE 1 TABLET BY MOUTH TWICE A DAY 30 tablet 10   Multiple Vitamin (MULTIVITAMIN WITH MINERALS) TABS tablet Take 1 tablet by mouth daily.     nitroGLYCERIN (NITROSTAT) 0.4 MG SL tablet Place 1 tablet (0.4 mg total) under the tongue every 5 (five) minutes as needed for chest pain. 90 tablet 3   PHENobarbital (LUMINAL) 64.8 MG tablet Take 64.8 mg by mouth 2 (two) times daily.     psyllium (HYDROCIL/METAMUCIL) 95 % PACK Take 1 packet by mouth daily. 240 each 0   rosuvastatin (CRESTOR) 10 MG tablet Take 1 tablet (10 mg total) by mouth daily. 30 tablet 11   TRUEplus Lancets 28G MISC Use as directed 100 each 4   No current facility-administered medications for this visit.    Review of Systems  Constitutional:  Constitutional negative. HENT: HENT negative.  Eyes: Eyes negative.  Respiratory: Respiratory negative.  Cardiovascular: Cardiovascular negative.  GI: Gastrointestinal negative.  Musculoskeletal: Musculoskeletal negative.  Skin: Skin negative.  Neurological: Positive for focal weakness and numbness.  Hematologic: Hematologic/lymphatic negative.  Psychiatric: Psychiatric negative.       Objective:  Objective   Vitals:   01/11/21 0839 01/11/21 0841  BP: 130/89 117/83  Pulse: 67   Resp: 20   Temp: 97.7 F (36.5 C)   SpO2: 98%   Weight: 192 lb (87.1 kg)   Height: 5\' 6"  (1.676 m)    Body mass index is 30.99 kg/m.  Physical Exam HENT:     Head: Normocephalic.     Nose:     Comments: Wearing a mask Eyes:     Pupils: Pupils are equal, round, and reactive to light.  Neck:     Vascular: No carotid bruit.  Cardiovascular:     Rate and Rhythm: Normal rate.     Pulses:          Radial pulses are 2+ on the right side and 2+ on the left side.       Posterior tibial pulses are 2+ on the right side and 2+ on the left side.  Abdominal:     General: Abdomen is flat. Bowel sounds are normal.     Palpations: Abdomen is soft.   Musculoskeletal:     Cervical back: Normal range of motion.  Skin:    General: Skin is warm.     Capillary Refill: Capillary refill takes less than 2 seconds.  Neurological:     General: No focal deficit present.     Mental Status: He is alert.  Psychiatric:        Mood and Affect: Mood normal.        Behavior: Behavior normal.        Thought Content: Thought content normal.  Judgment: Judgment normal.    Data:  Study Result  Carotid Arterial Duplex Study   Patient Name:  JULIUS MATUS  Date of Exam:   12/14/2020  Medical Rec #: [id number]        Accession #:    [id number]  Date of Birth: [birth year]        Patient Gender: M  Patient Age:   50Y  Exam Location:  [facility]  Procedure:      VAS US CAROTID  Referring Phys: [id number] [place]    ---------------------------------------------------------------------------  -----     Other Factors: Possible CABG.  Limitations    Today's exam was limited due to the high bifurcation of the                 carotid.   Performing Technologist: Ralene Cork RVT      Examination Guidelines: A complete evaluation includes B-mode imaging,  spectral  Doppler, color Doppler, and power Doppler as needed of all accessible  portions  of each vessel. Bilateral testing is considered an integral part of a  complete  examination. Limited examinations for reoccurring indications may be  performed  as noted.      Right Carotid Findings:  +----------+--------+--------+--------+------------------+-----------------  ---+            PSV cm/sEDV cm/sStenosisPlaque DescriptionComments               +----------+--------+--------+--------+------------------+-----------------  ---+  CCA Prox  98      12                                                       +----------+--------+--------+--------+------------------+-----------------  ---+  CCA Mid   83      15                                                        +----------+--------+--------+--------+------------------+-----------------  ---+  CCA Distal98      17                                                       +----------+--------+--------+--------+------------------+-----------------  ---+  ICA Prox  39      0                                 pre occlusive  thump.  +----------+--------+--------+--------+------------------+-----------------  ---+  ICA Mid                   Occluded                                         +----------+--------+--------+--------+------------------+-----------------  ---+  ICA Distal                Occluded                                         +----------+--------+--------+--------+------------------+-----------------  ---+  ECA       86      16                                                       +----------+--------+--------+--------+------------------+-----------------  ---+   +----------+--------+-------+----------+-------------------+            PSV cm/sEDV cmsDescribe  Arm Pressure (mmHG)  +----------+--------+-------+----------+-------------------+  [id number]            Monophasic                     +----------+--------+-------+----------+-------------------+   +---------+--------+--+--------+---------+  VertebralPSV cm/s65EDV cm/sAntegrade  +---------+--------+--+--------+---------+       Left Carotid Findings:  +----------+--------+--------+--------+------------------+--------------+            PSV cm/sEDV cm/sStenosisPlaque DescriptionComments        +----------+--------+--------+--------+------------------+--------------+  CCA Prox  108     38                                                +----------+--------+--------+--------+------------------+--------------+  CCA Mid   101     36                                                 +----------+--------+--------+--------+------------------+--------------+  CCA Distal108     41                                                +----------+--------+--------+--------+------------------+--------------+  ICA Prox  91      32      1-39%   heterogenous                      +----------+--------+--------+--------+------------------+--------------+  ICA Mid   74      35                                                +----------+--------+--------+--------+------------------+--------------+  ICA Distal                                          Not visualized  +----------+--------+--------+--------+------------------+--------------+  ECA       124     22                                                +----------+--------+--------+--------+------------------+--------------+   +----------+--------+--------+----------------+-------------------+            PSV cm/sEDV cm/sDescribe        Arm Pressure (mmHG)  +----------+--------+--------+----------------+-------------------+  [id number]  Multiphasic, WNL                     +----------+--------+--------+----------------+-------------------+   +---------+--------+--+--------+--+---------+  VertebralPSV cm/s65EDV cm/s23Antegrade  +---------+--------+--+--------+--+---------+           Summary:  Right Carotid: Evidence consistent with a total occlusion of the right  ICA.   Left Carotid: Velocities in the left ICA are consistent with a 1-39%  stenosis.   Vertebrals:  Bilateral vertebral arteries demonstrate antegrade flow.  Subclavians: Normal flow hemodynamics were seen in the left subclavian  artery.               Right subclavian waveform is monophasic.        Assessment/Plan:     50 year old male with occluded right carotid artery.     This occurred asymptomatically.  We will follow him every 2 years with carotid duplex.  I discussed the signs and  symptoms of stroke and he demonstrates good understanding.  Waynetta Sandy MD [facility]

## 2021-01-15 ENCOUNTER — Telehealth: Payer: Self-pay | Admitting: *Deleted

## 2021-01-15 ENCOUNTER — Encounter: Payer: Self-pay | Admitting: *Deleted

## 2021-01-15 NOTE — Telephone Encounter (Signed)
Called patient to provide appointment information for 01/18/21 labs/scan/OV. He prefers Therapist, sports to send [employer] and he will confirm. Did not want nurse to reach out to his significant other, saying "she is at work". Mychart message sent.

## 2021-01-16 ENCOUNTER — Telehealth: Payer: Self-pay | Admitting: *Deleted

## 2021-01-16 NOTE — Telephone Encounter (Signed)
Patient reports he has no way to obtain contrast in time for scan on Friday and does not wish to stay here all day to wait for 3 pm appointment. His girlfriend works [address] and returns home at 20. Friday is only day he can come for transportation. Offered him [facility] free [place] [facility] transport and he declines, saying "I need my girlfriend with me".  He is willing to have scan on 7/8 if he can get contrast and wishes to move MD visit to the following Friday, July 15th.

## 2021-01-16 NOTE — Telephone Encounter (Signed)
Called patient with appointment to see Dr. Benay Spice on 01/25/21 at 12:00. This RN will take his oral contrast to his home this evening to have for his scan on 01/18/21. Patient was appreciative.

## 2021-01-18 ENCOUNTER — Encounter (HOSPITAL_BASED_OUTPATIENT_CLINIC_OR_DEPARTMENT_OTHER): Payer: Self-pay

## 2021-01-18 ENCOUNTER — Ambulatory Visit: Payer: Medicare HMO | Admitting: Nurse Practitioner

## 2021-01-18 ENCOUNTER — Inpatient Hospital Stay: Payer: Medicare HMO | Attending: Oncology

## 2021-01-18 ENCOUNTER — Encounter: Payer: Self-pay | Admitting: Oncology

## 2021-01-18 ENCOUNTER — Other Ambulatory Visit: Payer: Self-pay

## 2021-01-18 ENCOUNTER — Ambulatory Visit (HOSPITAL_BASED_OUTPATIENT_CLINIC_OR_DEPARTMENT_OTHER)
Admission: RE | Admit: 2021-01-18 | Discharge: 2021-01-18 | Disposition: A | Discharge status: Home / Self Care | Payer: Medicare HMO | Source: Ambulatory Visit | Attending: Oncology | Admitting: Oncology

## 2021-01-18 DIAGNOSIS — D508 Other iron deficiency anemias: Secondary | ICD-10-CM | POA: Insufficient documentation

## 2021-01-18 DIAGNOSIS — C182 Malignant neoplasm of ascending colon: Secondary | ICD-10-CM | POA: Insufficient documentation

## 2021-01-18 DIAGNOSIS — E119 Type 2 diabetes mellitus without complications: Secondary | ICD-10-CM | POA: Diagnosis not present

## 2021-01-18 DIAGNOSIS — G4089 Other seizures: Secondary | ICD-10-CM | POA: Diagnosis not present

## 2021-01-18 DIAGNOSIS — Z9049 Acquired absence of other specified parts of digestive tract: Secondary | ICD-10-CM | POA: Diagnosis not present

## 2021-01-18 LAB — CBC WITH DIFFERENTIAL (CANCER CENTER ONLY)
Abs Immature Granulocytes: 0.03 10*3/uL (ref 0.00–0.07)
Basophils Absolute: 0.1 10*3/uL (ref 0.0–0.1)
Basophils Relative: 1 %
Eosinophils Absolute: 0.2 10*3/uL (ref 0.0–0.5)
Eosinophils Relative: 2 %
HCT: 46.2 % (ref 39.0–52.0)
Hemoglobin: 15.5 g/dL (ref 13.0–17.0)
Immature Granulocytes: 0 %
Lymphocytes Relative: 26 %
Lymphs Abs: 2.4 10*3/uL (ref 0.7–4.0)
MCH: 29.5 pg (ref 26.0–34.0)
MCHC: 33.5 g/dL (ref 30.0–36.0)
MCV: 87.8 fL (ref 80.0–100.0)
Monocytes Absolute: 0.7 10*3/uL (ref 0.1–1.0)
Monocytes Relative: 8 %
Neutro Abs: 5.8 10*3/uL (ref 1.7–7.7)
Neutrophils Relative %: 63 %
Platelet Count: 282 10*3/uL (ref 150–400)
RBC: 5.26 MIL/uL (ref 4.22–5.81)
RDW: 13 % (ref 11.5–15.5)
WBC Count: 9.2 10*3/uL (ref 4.0–10.5)
nRBC: 0 % (ref 0.0–0.2)

## 2021-01-18 LAB — CMP (CANCER CENTER ONLY)
ALT: 21 U/L (ref 0–44)
AST: 15 U/L (ref 15–41)
Albumin: 4.5 g/dL (ref 3.5–5.0)
Alkaline Phosphatase: 67 U/L (ref 38–126)
Anion gap: 10 (ref 5–15)
BUN: 11 mg/dL (ref 6–20)
CO2: 30 mmol/L (ref 22–32)
Calcium: 10.1 mg/dL (ref 8.9–10.3)
Chloride: 97 mmol/L — ABNORMAL LOW (ref 98–111)
Creatinine: 0.7 mg/dL (ref 0.61–1.24)
GFR, Estimated: >60 mL/min (ref >60)
Glucose, Bld: 94 mg/dL (ref 70–99)
Potassium: 4.3 mmol/L (ref 3.5–5.1)
Sodium: 137 mmol/L (ref 135–145)
Total Bilirubin: 0.3 mg/dL (ref 0.3–1.2)
Total Protein: 7.2 g/dL (ref 6.5–8.1)

## 2021-01-18 LAB — CEA (ACCESS): CEA (CHCC): 2.85 ng/mL (ref 0.00–5.00)

## 2021-01-18 LAB — CEA (IN HOUSE-CHCC): CEA (CHCC-In House): <1 ng/mL (ref 0.00–5.00)

## 2021-01-18 MED ORDER — IOHEXOL 300 MG/ML  SOLN
75.0000 mL | Freq: Once | INTRAMUSCULAR | Status: AC | PRN
  Administered 2021-01-18: 75 mL via INTRAVENOUS

## 2021-01-21 ENCOUNTER — Telehealth: Payer: Self-pay | Admitting: *Deleted

## 2021-01-21 NOTE — Telephone Encounter (Signed)
Per Dr.Sherrill, advised pt of message below. Pt was appreciative and verbalized understanding.

## 2021-01-21 NOTE — Telephone Encounter (Signed)
-----   Message from Ladell Pier, MD sent at 01/20/2021  5:15 PM EDT ----- Please call patient, CTs are negative for cancer, f/u as scheduled

## 2021-01-25 ENCOUNTER — Other Ambulatory Visit: Payer: Self-pay

## 2021-01-25 ENCOUNTER — Inpatient Hospital Stay: Payer: Medicare HMO | Admitting: Oncology

## 2021-01-25 ENCOUNTER — Other Ambulatory Visit (HOSPITAL_COMMUNITY): Payer: Self-pay

## 2021-01-25 VITALS — BP 125/86 | HR 69 | Temp 98.1°F | Resp 18 | Wt 180.0 lb

## 2021-01-25 DIAGNOSIS — C182 Malignant neoplasm of ascending colon: Secondary | ICD-10-CM | POA: Diagnosis not present

## 2021-01-25 MED ORDER — CAPECITABINE 500 MG PO TABS
ORAL_TABLET | ORAL | 0 refills | Status: DC
  Filled 2021-01-25: qty 98, fill #0

## 2021-01-25 NOTE — Progress Notes (Signed)
[place] OFFICE PROGRESS NOTE   Diagnosis: Colon cancer  INTERVAL HISTORY:   [name] is here today with his girlfriend.  He has intermittent partial seizures.  He has irregular bowel habits, this is a chronic finding.  No bleeding.  No other complaint. A peripheral blood sample for circulating tumor DNA returned positive on 01/10/2021.  We discussed this result with him by telephone and recommended he undergo staging CT scans.  He had the CTs on 01/18/2021.  He is here today to discuss these results and treatment options.  Objective:  Vital signs in last 24 hours:  Blood pressure 125/86, pulse 69, temperature 98.1 F (36.7 C), temperature source Temporal, resp. rate 18, weight 180 lb (81.6 kg), SpO2 100 %.    Lymphatics: No cervical, supraclavicular, axillary, or inguinal nodes Resp: Lungs clear bilaterally Cardio: Regular rate and rhythm GI: No mass, nontender, no hepatosplenomegaly Vascular: No leg edema Neuro: Alert and oriented    Lab Results:  Lab Results  Component Value Date   WBC 9.2 01/18/2021   HGB 15.5 01/18/2021   HCT 46.2 01/18/2021   MCV 87.8 01/18/2021   PLT 282 01/18/2021   NEUTROABS 5.8 01/18/2021    CMP  Lab Results  Component Value Date   NA 137 01/18/2021   K 4.3 01/18/2021   CL 97 (L) 01/18/2021   CO2 30 01/18/2021   GLUCOSE 94 01/18/2021   BUN 11 01/18/2021   CREATININE 0.70 01/18/2021   CALCIUM 10.1 01/18/2021   PROT 7.2 01/18/2021   ALBUMIN 4.5 01/18/2021   AST 15 01/18/2021   ALT 21 01/18/2021   ALKPHOS 67 01/18/2021   BILITOT 0.3 01/18/2021   GFRNONAA >60 01/18/2021    Lab Results  Component Value Date   CEA1 <1.00 01/18/2021   CEA 2.85 01/18/2021    Lab Results  Component Value Date   INR 1.1 09/26/2020   LABPROT 14.1 09/26/2020     Medications: I have reviewed the patient's current medications.   Assessment/Plan: Colon cancer, ascending, stage II (T3N0), status post a right colectomy  09/27/2020 Colonoscopy 09/24/2020-ascending colon mass-biopsy revealed adenocarcinoma, polyps removed from the hepatic flexure and sigmoid colon-tubular adenomas CT 09/24/2020-proximal colonic mass, no obstruction, subcentimeter nodes in the right lower quadrant mesentery-nonspecific, small bilateral pleural effusions Right colectomy 09/27/2020-moderate differentiated adenocarcinoma of the ascending colon, 0/13 nodes, no lymphovascular perineural invasion, no tumor deposits, no macroscopic perforation, MSS, no loss of mismatch repair protein expression Guardant Reveal 11/02/2020-negative Guardant Reveal 12/31/2020-ctDNA detected CTs 01/18/2021-no evidence of recurrent disease Iron deficiency anemia secondary to #1 Diabetes Seizure disorder CHF during March 2022 hospital mission     Disposition: [name] was diagnosed with stage II colon cancer when he underwent a right colectomy in March.  The tumor did not have high risk features.  He did not receive adjuvant therapy.  An initial assay for circulating tumor DNA was negative, but a repeat study on 12/31/2020 returned positive.  The CEA is normal there is no clinical evidence of metastatic disease and restaging CTs are negative.  I discussed the implications of the positive DNA test with Mr. Coate.  He understands this assay is highly predictive of development of recurrent colon cancer.  He is now 3 months out from the initial colectomy.  I recommend "adjuvant "systemic therapy.  We discussed single agent 5-FU therapy.  We also discussed the expected benefit with the addition of oxaliplatin.  We reviewed the need for Port-A-Cath placement to receive oxaliplatin.  Mr.  Slusher indicated repeatedly that he does not wish to receive intravenous chemotherapy.  He understands the potential increased cure rate with the addition of oxaliplatin.  I recommend single agent capecitabine.  We reviewed potential toxicities associated with capecitabine including  the chance of nausea, mucositis, diarrhea, and hematologic toxicity.  We discussed the rash, sun sensitivity, hyperpigmentation, and hand/foot syndrome associated with capecitabine.  He agrees to proceed.  The plan is to begin capecitabine on 02/01/2021.  He will return for an office visit prior to cycle 2.  He will be contacted by the Cancer center pharmacist for further review regarding toxicities associated with capecitabine and to arrange for drug delivery.  Betsy Coder, MD  01/25/2021  12:21 PM

## 2021-01-28 ENCOUNTER — Telehealth: Payer: Self-pay | Admitting: Pharmacy Technician

## 2021-01-28 ENCOUNTER — Telehealth: Payer: Self-pay

## 2021-01-28 ENCOUNTER — Other Ambulatory Visit (HOSPITAL_COMMUNITY): Payer: Self-pay

## 2021-01-28 DIAGNOSIS — C182 Malignant neoplasm of ascending colon: Secondary | ICD-10-CM

## 2021-01-28 MED ORDER — CAPECITABINE 500 MG PO TABS
ORAL_TABLET | ORAL | 0 refills | Status: DC
  Filled 2021-01-28: qty 98, fill #0
  Filled 2021-01-29: qty 98, 21d supply, fill #0

## 2021-01-28 NOTE — Telephone Encounter (Signed)
Oral Oncology Patient Advocate Encounter   Received notification from [facility] that prior authorization for Xeloda is required.   PA submitted on CoverMyMeds Key [id number]  Status is pending   Oral Oncology Clinic will continue to follow.  [place] Patient [place] Phone [phone] Fax [phone] 01/28/2021 11:38 AM

## 2021-01-28 NOTE — Telephone Encounter (Signed)
Oral Oncology Pharmacist Encounter  Received new prescription for Xeloda (capecitabine) for the "adjuvant" treatment of stage II colon cancer, planned duration 3 to 6 months. Plan to start treatment 02/01/21.   Pts initial assay of circulating tumor DNA was negative following R colectomy. Repeat study now returning positive per MD documentation. Pt now initiating monotherapy with capecitabine, pt did not wish to receive IV chemotherapy.   CMP from 01/18/21 assessed, no relevant lab abnormalities. Prescription dose and frequency assessed.   Current medication list in Epic reviewed, one DDI with capecitabine identified:  Esomeprazole - Proton Pump Inhibitors (PPI) may diminish the therapeutic effect of capecitabine, varying information on the clinical impact. Recommend evaluating the need for a PPI/acid suppression. If acid suppression is needed, attempt switching to a H2 antagonist (eg, famotidine) if possible.  Evaluated chart and no patient barriers to medication adherence identified.   Prescription has been e-scribed to the [facility] for benefits analysis and approval.  Oral Oncology Clinic will continue to follow for insurance authorization, copayment issues, initial counseling and start date.  Patient agreed to treatment on 01/25/21 per MD documentation.  Benn Moulder, PharmD Pharmacy Resident  01/28/2021 2:45 PM

## 2021-01-29 ENCOUNTER — Other Ambulatory Visit (HOSPITAL_COMMUNITY): Payer: Self-pay

## 2021-01-29 NOTE — Telephone Encounter (Signed)
Oral Chemotherapy Pharmacist Encounter  Jeff Kelley scheduled to deliver medication to patient on 01/30/21. Pt will start Xeloda (capecitabine) on 02/01/21.   Patient Education I spoke with patient for overview of new oral chemotherapy medication: Xeloda (capecitabine) for the treatment of stage II colon cancer, planned duration 3 to 6 months.   Pt is doing well. Counseled patient on administration, dosing, side effects, monitoring, drug-food interactions, safe handling, storage, and disposal. Patient will take capecitabine 4 tablets (2000 mg) by mouth in AM and 3 tablets (1500 mg) in PM with food. Take for 14 days, then hold for 7 days. Repeat every 21 days.   Side effects include but not limited to: hand-foot syndrome, diarrhea, nausea, vomiting, hyperpigmentation, and mucositis.    Reviewed with patient importance of keeping a medication schedule and plan for any missed doses.  After discussion with patient no patient barriers to medication adherence identified.   Jeff Kelley voiced understanding and appreciation. All questions answered. Medication handout provided.  Provided patient with [facility] [place] [facility] phone number. Patient knows to call the office with questions or concerns. Oral Chemotherapy Navigation Clinic will continue to follow.  Benn Moulder, PharmD Pharmacy Resident  01/29/2021 11:00 AM

## 2021-01-29 NOTE — Telephone Encounter (Signed)
Oral Oncology Patient Advocate Encounter  Prior Authorization for Xeloda has been approved through Medicare B benefit.  PA# [id number] Effective dates: 01/28/21 through 07/13/21  Patients co-pay is $19.97.  Oral Oncology Clinic will continue to follow.   [place] Patient [place] Phone [phone] Fax [phone] 01/29/2021 8:54 AM

## 2021-01-30 ENCOUNTER — Other Ambulatory Visit: Payer: Self-pay | Admitting: *Deleted

## 2021-01-30 ENCOUNTER — Other Ambulatory Visit (HOSPITAL_COMMUNITY): Payer: Self-pay

## 2021-01-30 ENCOUNTER — Encounter: Payer: Self-pay | Admitting: *Deleted

## 2021-01-30 ENCOUNTER — Encounter: Payer: Self-pay | Admitting: Oncology

## 2021-01-30 NOTE — Progress Notes (Signed)
Patient does not want to take chemo. Pharmacy already  shipped the med on 01/29/21. His copay was $19. Will suggest patient bring med to office at next appointment for appropriate disposal.

## 2021-02-07 ENCOUNTER — Telehealth: Payer: Self-pay

## 2021-02-07 NOTE — Telephone Encounter (Signed)
Noted.  Will make MD aware.  Thanks!

## 2021-02-07 NOTE — Progress Notes (Signed)
Patient ID: Jeff Kelley, male    DOB: [birth year]  MRN: [id number]  CC: Annual Physical Exam   Subjective: Jeff Kelley is a 50 y.o. male who presents for annual physical exam.   His concerns today include: none   Patient Active Problem List   Diagnosis Date Noted   Coronary artery disease    Abnormal echocardiogram 09/23/2020   Seizures (HCC)    Occult GI bleeding    Elevated troponin    CHF (congestive heart failure) (HCC)    Hyperglycemia    Iron deficiency anemia    Hyponatremia    Symptomatic anemia 09/21/2020     Current Outpatient Medications on File Prior to Visit  Medication Sig Dispense Refill   acetaminophen (TYLENOL) 500 MG tablet Take 1,000 mg by mouth every 6 (six) hours as needed for headache (pain).     aspirin 81 MG EC tablet Take 1 tablet (81 mg total) by mouth daily. Swallow whole. 30 tablet 11   Cenobamate (XCOPRI) 100 MG TABS Take 100 mg by mouth daily.     Cholecalciferol (VITAMIN D3) 125 MCG (5000 UT) CAPS Take 5,000 Units by mouth daily.     esomeprazole (NEXIUM) 20 MG capsule Take 20 mg by mouth daily.     ferrous sulfate 325 (65 FE) MG tablet Take 1 tablet (325 mg total) by mouth daily with breakfast. 30 tablet 0   levETIRAcetam (KEPPRA) 750 MG tablet Take 750 mg by mouth daily. Previous dose 2 tablets (1500 mg) by mouth twice daily - reduce dose starting 09/21/2020 - take 1 1/2 tablets (1125 mg) by mouth twice daily for 3-4 weeks then take 1 tablet (750 mg) twice daily, then consult MD for further instructions.     LORazepam (ATIVAN) 1 MG tablet Take 1 mg by mouth 2 (two) times daily.     metFORMIN (GLUCOPHAGE) 500 MG tablet Take 1 tablet (500 mg total) by mouth 2 (two) times daily with a meal. 180 tablet 0   metoprolol tartrate (LOPRESSOR) 25 MG tablet TAKE 1 TABLET BY MOUTH TWICE A DAY 30 tablet 10   Multiple Vitamin (MULTIVITAMIN WITH MINERALS) TABS tablet Take 1 tablet by mouth daily.     nitroGLYCERIN (NITROSTAT) 0.4 MG SL tablet Place 1  tablet (0.4 mg total) under the tongue every 5 (five) minutes as needed for chest pain. 90 tablet 3   PHENobarbital (LUMINAL) 64.8 MG tablet Take 64.8 mg by mouth 2 (two) times daily.     psyllium (HYDROCIL/METAMUCIL) 95 % PACK Take 1 packet by mouth daily. 240 each 0   rosuvastatin (CRESTOR) 10 MG tablet Take 1 tablet (10 mg total) by mouth daily. 30 tablet 11   TRUEplus Lancets 28G MISC Use as directed 100 each 4   No current facility-administered medications on file prior to visit.    Allergies  Allergen Reactions   Atorvastatin     Diarrhea, muscle pain   Codeine Other (See Comments)    Patient states he was told he was allergic when he was [age] years old, unknown reaction.   Penicillins Other (See Comments)    Patient states he was told he was allergic when he was [age] years ago, unknown reaction    Social History   Socioeconomic History   Marital status: Unknown    Spouse name: Not on file   Number of children: Not on file   Years of education: Not on file   Highest education level: Not on file  Occupational History  Not on file  Tobacco Use   Smoking status: Former   Smokeless tobacco: Never  Vaping Use   Vaping Use: Never used  Substance and Sexual Activity   Alcohol use: Not Currently   Drug use: Not Currently   Sexual activity: Not on file  Other Topics Concern   Not on file  Social History Narrative   Not on file   Social Determinants of Health   Financial Resource Strain: Not on file  Food Insecurity: Not on file  Transportation Needs: Not on file  Physical Activity: Not on file  Stress: Not on file  Social Connections: Not on file  Intimate Partner Violence: Not on file    Family History  Problem Relation Age of Onset   Heart attack Mother    Diabetes Mother    Lung cancer Father     Past Surgical History:  Procedure Laterality Date   BIOPSY  09/24/2020   Procedure: BIOPSY;  Surgeon: Juanita Craver, MD;  Location: [place];  Service:  Endoscopy;;   COLONOSCOPY WITH PROPOFOL N/A 09/24/2020   Procedure: COLONOSCOPY WITH PROPOFOL;  Surgeon: Juanita Craver, MD;  Location: [facility] ENDOSCOPY;  Service: Endoscopy;  Laterality: N/A;   ESOPHAGEAL BRUSHING  09/24/2020   Procedure: ESOPHAGEAL BRUSHING;  Surgeon: Juanita Craver, MD;  Location: [facility] ENDOSCOPY;  Service: Endoscopy;;   ESOPHAGOGASTRODUODENOSCOPY (EGD) WITH PROPOFOL N/A 09/24/2020   Procedure: ESOPHAGOGASTRODUODENOSCOPY (EGD) WITH PROPOFOL;  Surgeon: Juanita Craver, MD;  Location: [facility] ENDOSCOPY;  Service: Endoscopy;  Laterality: N/A;   LAPAROSCOPIC PARTIAL COLECTOMY N/A 09/27/2020   Procedure: LAPAROSCOPIC ASSISTED  PARTIAL COLECTOMY;  Surgeon: Jovita Kussmaul, MD;  Location: [place];  Service: General;  Laterality: N/A;   LEFT HEART CATH AND CORONARY ANGIOGRAPHY N/A 11/09/2020   Procedure: LEFT HEART CATH AND CORONARY ANGIOGRAPHY;  Surgeon: Belva Crome, MD;  Location: [place] CV LAB;  Service: Cardiovascular;  Laterality: N/A;   POLYPECTOMY  09/24/2020   Procedure: POLYPECTOMY;  Surgeon: Juanita Craver, MD;  Location: [facility] ENDOSCOPY;  Service: Endoscopy;;    ROS: Review of Systems Negative except as stated above  PHYSICAL EXAM: BP 133/89 (BP Location: Left Arm, Patient Position: Sitting, Cuff Size: Normal)   Pulse 60   Temp 98.1 F (36.7 C)   Resp 16   Ht 5' 5.98" (1.676 m)   Wt 180 lb (81.6 kg)   SpO2 98%   BMI 29.07 kg/m   Physical Exam HENT:     Head: Normocephalic and atraumatic.     Right Ear: Tympanic membrane, ear canal and external ear normal.     Left Ear: Tympanic membrane, ear canal and external ear normal.  Eyes:     Extraocular Movements: Extraocular movements intact.     Conjunctiva/sclera: Conjunctivae normal.     Pupils: Pupils are equal, round, and reactive to light.  Cardiovascular:     Rate and Rhythm: Normal rate and regular rhythm.     Pulses: Normal pulses.     Heart sounds: Normal heart sounds.  Pulmonary:     Effort: Pulmonary effort is normal.      Breath sounds: Normal breath sounds.  Abdominal:     General: Bowel sounds are normal.     Palpations: Abdomen is soft.  Genitourinary:    Comments: Patient declined exam.  Musculoskeletal:        General: Normal range of motion.     Cervical back: Normal range of motion and neck supple.  Skin:    General: Skin is warm and  dry.     Capillary Refill: Capillary refill takes less than 2 seconds.  Neurological:     General: No focal deficit present.     Mental Status: He is alert and oriented to person, place, and time.  Psychiatric:        Mood and Affect: Mood normal.        Behavior: Behavior normal.   ASSESSMENT AND PLAN: 1. Annual physical exam: - Counseled on 150 minutes of exercise per week as tolerated, healthy eating (including decreased daily intake of saturated fats, cholesterol, added sugars, sodium), STI prevention, and routine healthcare maintenance.  2. Screening for metabolic disorder: - CMP last obtained 01/18/2021.  3. Screening for deficiency anemia: - CBC last obtained 01/18/2021.  4. Screening cholesterol level: - Patient reports intolerant to Statins which causes leg pain. - Lipid panel to screen for high cholesterol.  - Lipid panel  5. Thyroid disorder screen: - TSH to check thyroid function.  - TSH  6. Need for hepatitis C screening test: - Hepatitis C antibody to screen for hepatitis C.  - Hepatitis C Antibody  7. Type 2 diabetes mellitus without complication, without long-term current use of insulin ([place]): - Diabetes discussed at previous visit on 12/21/2020. - Microalbumin / creatinine urine ratio to check kidney function.  - Follow-up with primary provider as scheduled.  - Microalbumin / creatinine urine ratio  8. Tetanus, diphtheria, and acellular pertussis (Tdap) vaccination declined: - Patient declined.   9. Pneumococcal vaccination declined: - Patient declined.   10. Herpes zoster vaccination declined: - Patient declined.      Patient was given the opportunity to ask questions.  Patient verbalized understanding of the plan and was able to repeat key elements of the plan. Patient was given clear instructions to go to Emergency Department or return to medical center if symptoms don't improve, worsen, or new problems develop.The patient verbalized understanding.   Orders Placed This Encounter  Procedures   Microalbumin / creatinine urine ratio   Hepatitis C Antibody   TSH   Lipid panel    Follow-up with primary provider as scheduled.  Camillia Herter, NP

## 2021-02-07 NOTE — Telephone Encounter (Signed)
I called patient, advised that we would like to see him next week in office to discuss further cardiac plan. Patient states that he has stopped all chemo, he is unable to walk, so he does not know what else can be done. I did advise that we at least needed to see him to follow up from the last visit and make Dr.[name] aware of what all is going on since then. He did schedule for next Friday this is the only day his girlfriend can bring him, I advised if this does not work to call us back. Patient verbalized understanding.

## 2021-02-07 NOTE — Telephone Encounter (Signed)
Patient called back. He had to cancel his appointment 8/5 due to his ride having an appointment the same day. I did not see another Friday available until 12/30, so he decided to just keep the appointment 9/30.

## 2021-02-08 ENCOUNTER — Ambulatory Visit (INDEPENDENT_AMBULATORY_CARE_PROVIDER_SITE_OTHER): Payer: Medicare HMO | Admitting: Family

## 2021-02-08 ENCOUNTER — Other Ambulatory Visit: Payer: Self-pay

## 2021-02-08 VITALS — BP 133/89 | HR 60 | Temp 98.1°F | Resp 16 | Ht 65.98 in | Wt 180.0 lb

## 2021-02-08 DIAGNOSIS — E119 Type 2 diabetes mellitus without complications: Secondary | ICD-10-CM | POA: Diagnosis not present

## 2021-02-08 DIAGNOSIS — Z13228 Encounter for screening for other metabolic disorders: Secondary | ICD-10-CM

## 2021-02-08 DIAGNOSIS — Z2821 Immunization not carried out because of patient refusal: Secondary | ICD-10-CM

## 2021-02-08 DIAGNOSIS — Z13220 Encounter for screening for lipoid disorders: Secondary | ICD-10-CM

## 2021-02-08 DIAGNOSIS — Z1159 Encounter for screening for other viral diseases: Secondary | ICD-10-CM

## 2021-02-08 DIAGNOSIS — Z0000 Encounter for general adult medical examination without abnormal findings: Secondary | ICD-10-CM

## 2021-02-08 DIAGNOSIS — Z1329 Encounter for screening for other suspected endocrine disorder: Secondary | ICD-10-CM

## 2021-02-08 DIAGNOSIS — Z130 Encounter for screening for diseases of the blood and blood-forming organs and certain disorders involving the immune mechanism: Secondary | ICD-10-CM

## 2021-02-08 NOTE — Progress Notes (Signed)
Pt presents for annual wellness visit

## 2021-02-08 NOTE — Patient Instructions (Signed)
Preventive Care 40-51 Years Old, Male Preventive care refers to lifestyle choices and visits with your health care provider that can promote health and wellness. This includes: A yearly physical exam. This is also called an annual wellness visit. Regular dental and eye exams. Immunizations. Screening for certain conditions. Healthy lifestyle choices, such as: Eating a healthy diet. Getting regular exercise. Not using drugs or products that contain nicotine and tobacco. Limiting alcohol use. What can I expect for my preventive care visit? Physical exam Your health care provider will check your: Height and weight. These may be used to calculate your BMI (body mass index). BMI is a measurement that tells if you are at a healthy weight. Heart rate and blood pressure. Body temperature. Skin for abnormal spots. Counseling Your health care provider may ask you questions about your: Past medical problems. Family's medical history. Alcohol, tobacco, and drug use. Emotional well-being. Home life and relationship well-being. Sexual activity. Diet, exercise, and sleep habits. Work and work environment. Access to firearms. What immunizations do I need?  Vaccines are usually given at various ages, according to a schedule. Your health care provider will recommend vaccines for you based on your age, medicalhistory, and lifestyle or other factors, such as travel or where you work. What tests do I need? Blood tests Lipid and cholesterol levels. These may be checked every 5 years, or more often if you are over 50 years old. Hepatitis C test. Hepatitis B test. Screening Lung cancer screening. You may have this screening every year starting at age 55 if you have a 30-pack-year history of smoking and currently smoke or have quit within the past 15 years. Prostate cancer screening. Recommendations will vary depending on your family history and other risks. Genital exam to check for testicular cancer  or hernias. Colorectal cancer screening. All adults should have this screening starting at age 50 and continuing until age 75. Your health care provider may recommend screening at age 45 if you are at increased risk. You will have tests every 1-10 years, depending on your results and the type of screening test. Diabetes screening. This is done by checking your blood sugar (glucose) after you have not eaten for a while (fasting). You may have this done every 1-3 years. STD (sexually transmitted disease) testing, if you are at risk. Follow these instructions at home: Eating and drinking  Eat a diet that includes fresh fruits and vegetables, whole grains, lean protein, and low-fat dairy products. Take vitamin and mineral supplements as recommended by your health care provider. Do not drink alcohol if your health care provider tells you not to drink. If you drink alcohol: Limit how much you have to 0-2 drinks a day. Be aware of how much alcohol is in your drink. In the [place] one drink equals one 12 oz bottle of beer (355 mL), one 5 oz glass of wine (148 mL), or one 1 oz glass of hard liquor (44 mL).  Lifestyle Take daily care of your teeth and gums. Brush your teeth every morning and night with fluoride toothpaste. Floss one time each day. Stay active. Exercise for at least 30 minutes 5 or more days each week. Do not use any products that contain nicotine or tobacco, such as cigarettes, e-cigarettes, and chewing tobacco. If you need help quitting, ask your health care provider. Do not use drugs. If you are sexually active, practice safe sex. Use a condom or other form of protection to prevent STIs (sexually transmitted infections). If told by   your health care provider, take low-dose aspirin daily starting at age 50. Find healthy ways to cope with stress, such as: Meditation, yoga, or listening to music. Journaling. Talking to a trusted person. Spending time with friends and  family. Safety Always wear your seat belt while driving or riding in a vehicle. Do not drive: If you have been drinking alcohol. Do not ride with someone who has been drinking. When you are tired or distracted. While texting. Wear a helmet and other protective equipment during sports activities. If you have firearms in your house, make sure you follow all gun safety procedures. What's next? Go to your health care provider once a year for an annual wellness visit. Ask your health care provider how often you should have your eyes and teeth checked. Stay up to date on all vaccines. This information is not intended to replace advice given to you by your health care provider. Make sure you discuss any questions you have with your healthcare provider. Document Revised: 03/29/2019 Document Reviewed: 06/24/2018 Elsevier Patient Education  2022 Elsevier Inc.  

## 2021-02-09 LAB — LIPID PANEL
Chol/HDL Ratio: 4.9 ratio (ref 0.0–5.0)
Cholesterol, Total: 258 mg/dL — ABNORMAL HIGH (ref 100–199)
HDL: 53 mg/dL (ref >39)
LDL Chol Calc (NIH): 171 mg/dL — ABNORMAL HIGH (ref 0–99)
Triglycerides: 187 mg/dL — ABNORMAL HIGH (ref 0–149)
VLDL Cholesterol Cal: 34 mg/dL (ref 5–40)

## 2021-02-09 LAB — HEPATITIS C ANTIBODY: Hep C Virus Ab: <0.1 s/co ratio (ref 0.0–0.9)

## 2021-02-09 LAB — TSH: TSH: 3.49 u[IU]/mL (ref 0.450–4.500)

## 2021-02-09 NOTE — Progress Notes (Signed)
Thyroid function normal.   Hepatitis C negative.   Cholesterol higher than expected. High cholesterol may increase risk of heart attack and/or stroke. Consider eating more fruits, vegetables, and lean baked meats such as chicken or fish. Moderate intensity exercise at least 150 minutes as tolerated per week may help as well.   During office visit with me patient reported he does not take cholesterol medication because it causes muscle pain. Patient should continue plan and follow-up per Cardiology.

## 2021-02-15 ENCOUNTER — Ambulatory Visit: Payer: Medicare HMO | Admitting: Cardiovascular Disease

## 2021-02-17 ENCOUNTER — Ambulatory Visit: Payer: Medicare HMO | Admitting: Nurse Practitioner

## 2021-02-21 ENCOUNTER — Telehealth: Payer: Self-pay | Admitting: *Deleted

## 2021-02-21 NOTE — Telephone Encounter (Signed)
Per request of Dr. Benay Spice called patient to inquire if he had decided to start the Xeloda and if so, when did he begin. Would like to confirm if he has the medication and if so, to please put away and bring to his next office visit. Also sent above in Mychart message.

## 2021-02-22 ENCOUNTER — Inpatient Hospital Stay: Payer: Medicare HMO | Admitting: Oncology

## 2021-02-22 ENCOUNTER — Inpatient Hospital Stay: Payer: Medicare HMO

## 2021-03-05 ENCOUNTER — Telehealth: Payer: Self-pay | Admitting: *Deleted

## 2021-03-05 NOTE — Telephone Encounter (Signed)
Per [facility], patient is due his last blood draw by 03/20/21 (time point 3). Left VM requesting he call to discuss as well as sent MyChart message.

## 2021-03-05 NOTE — Telephone Encounter (Signed)
[name] agrees to the [place] testing due by 03/20/21. He is requested the mobile phlebotomy due to limited transportation. Notified Burnard Leigh w/[facility] testing.

## 2021-04-11 NOTE — Progress Notes (Signed)
Cardiology Office Note:   Date:  04/12/2021  NAME:  Jeff Kelley    MRN: [id number] DOB:  September 19, 1970   PCP:  Camillia Herter, NP  Cardiologist:  Evalina Field, MD  Electrophysiologist:  None   Referring MD: No ref. provider found   Chief Complaint  Patient presents with   Follow-up    History of Present Illness:   Jeff Kelley is a 50 y.o. male with a hx of 2-vessel CAD, DM, colon CA, seizure d/o who presents for follow-up.  He reports he is doing well.  Denies any angina.  He reports his weakness has resolved.  He is still using a walker and this has been attributed to statin use.  He stopped Crestor and Lipitor and is doing well.  He denies chest pains or trouble breathing.  Remains on aspirin.  He is on metoprolol tartrate 25 mg twice daily.  He has not seen neurology yet.  Informs me he is not required any nitroglycerin.  I did discuss with him that we need to pursue PCI.  He apparently was turned down for bypass surgery.  I will reach back out to [facility] and get him set up.  He also needs to be on PCSK9 inhibitor.  We will get him set up for this.  There has been increase in CEA values per oncology but no recurrence of cancer per CT scans.  Problem List 1. CAD -85% mid LAD -80% D1 -80% distal RCA -EF 55-60% with distal apical anterior WMA 2. DM -A1c 5.3 3. Colon Adenocarcinoma  -Stage II -> treated with partial colectomy 09/2020 4. Ectatic Ascending Aorta -42 mm 01/18/2021 5. HLD -T chol 258, HDL 53, LDL 171, TG 187 6. Seizure Disorder 7. Carotid artery disease  -R ICA 100% -L ICA 1-39%  Past Medical History: Past Medical History:  Diagnosis Date   Colon cancer ([place])    Coronary artery disease    Diabetes mellitus without complication ([place])    Hyperlipidemia    Hypertension    Seizures ([place])     Past Surgical History: Past Surgical History:  Procedure Laterality Date   BIOPSY  09/24/2020   Procedure: BIOPSY;  Surgeon: Juanita Craver, MD;  Location:  [place];  Service: Endoscopy;;   COLONOSCOPY WITH PROPOFOL N/A 09/24/2020   Procedure: COLONOSCOPY WITH PROPOFOL;  Surgeon: Juanita Craver, MD;  Location: [facility] ENDOSCOPY;  Service: Endoscopy;  Laterality: N/A;   ESOPHAGEAL BRUSHING  09/24/2020   Procedure: ESOPHAGEAL BRUSHING;  Surgeon: Juanita Craver, MD;  Location: [facility] ENDOSCOPY;  Service: Endoscopy;;   ESOPHAGOGASTRODUODENOSCOPY (EGD) WITH PROPOFOL N/A 09/24/2020   Procedure: ESOPHAGOGASTRODUODENOSCOPY (EGD) WITH PROPOFOL;  Surgeon: Juanita Craver, MD;  Location: [facility] ENDOSCOPY;  Service: Endoscopy;  Laterality: N/A;   LAPAROSCOPIC PARTIAL COLECTOMY N/A 09/27/2020   Procedure: LAPAROSCOPIC ASSISTED  PARTIAL COLECTOMY;  Surgeon: Jovita Kussmaul, MD;  Location: [place];  Service: General;  Laterality: N/A;   LEFT HEART CATH AND CORONARY ANGIOGRAPHY N/A 11/09/2020   Procedure: LEFT HEART CATH AND CORONARY ANGIOGRAPHY;  Surgeon: Belva Crome, MD;  Location: [place] CV LAB;  Service: Cardiovascular;  Laterality: N/A;   POLYPECTOMY  09/24/2020   Procedure: POLYPECTOMY;  Surgeon: Juanita Craver, MD;  Location: [place] ENDOSCOPY;  Service: Endoscopy;;    Current Medications: Current Meds  Medication Sig   acetaminophen (TYLENOL) 500 MG tablet Take 1,000 mg by mouth every 6 (six) hours as needed for headache (pain).   aspirin 81 MG EC tablet Take  1 tablet (81 mg total) by mouth daily. Swallow whole.   Cholecalciferol (VITAMIN D3) 125 MCG (5000 UT) CAPS Take 5,000 Units by mouth daily.   esomeprazole (NEXIUM) 20 MG capsule Take 20 mg by mouth daily.   ferrous sulfate 325 (65 FE) MG tablet Take 1 tablet (325 mg total) by mouth daily with breakfast.   levETIRAcetam (KEPPRA) 750 MG tablet Take 750 mg by mouth daily. Previous dose 2 tablets (1500 mg) by mouth twice daily - reduce dose starting 09/21/2020 - take 1 1/2 tablets (1125 mg) by mouth twice daily for 3-4 weeks then take 1 tablet (750 mg) twice daily, then consult MD for further instructions.   LORazepam  (ATIVAN) 1 MG tablet Take 1 mg by mouth 2 (two) times daily.   metoprolol tartrate (LOPRESSOR) 25 MG tablet TAKE 1 TABLET BY MOUTH TWICE A DAY   Multiple Vitamin (MULTIVITAMIN WITH MINERALS) TABS tablet Take 1 tablet by mouth daily.   PHENobarbital (LUMINAL) 64.8 MG tablet Take 64.8 mg by mouth 2 (two) times daily.   psyllium (HYDROCIL/METAMUCIL) 95 % PACK Take 1 packet by mouth daily.   TRUEplus Lancets 28G MISC Use as directed     Allergies:    Atorvastatin, Codeine, and Penicillins   Social History: Social History   Socioeconomic History   Marital status: Unknown    Spouse name: Not on file   Number of children: Not on file   Years of education: Not on file   Highest education level: Not on file  Occupational History   Not on file  Tobacco Use   Smoking status: Former   Smokeless tobacco: Never  Vaping Use   Vaping Use: Never used  Substance and Sexual Activity   Alcohol use: Not Currently   Drug use: Not Currently   Sexual activity: Not on file  Other Topics Concern   Not on file  Social History Narrative   Not on file   Social Determinants of Health   Financial Resource Strain: Not on file  Food Insecurity: Not on file  Transportation Needs: Not on file  Physical Activity: Not on file  Stress: Not on file  Social Connections: Not on file     Family History: The patient's family history includes Diabetes in his mother; Heart attack in his mother; Lung cancer in his father.  ROS:   All other ROS reviewed and negative. Pertinent positives noted in the HPI.     EKGs/Labs/Other Studies Reviewed:   The following studies were personally reviewed by me today:  Recent Labs: 09/21/2020: B Natriuretic Peptide 297.0 10/05/2020: Magnesium 1.8 01/18/2021: ALT 21; BUN 11; Creatinine 0.70; Hemoglobin 15.5; Platelet Count 282; Potassium 4.3; Sodium 137 02/08/2021: TSH 3.490   Recent Lipid Panel    Component Value Date/Time   CHOL 258 (H) 02/08/2021 1150   TRIG 187 (H)  02/08/2021 1150   HDL 53 02/08/2021 1150   CHOLHDL 4.9 02/08/2021 1150   CHOLHDL 5.7 09/24/2020 0323   VLDL 16 09/24/2020 0323   LDLCALC 171 (H) 02/08/2021 1150    Physical Exam:   VS:  BP 121/82   Pulse 66   Ht 5\' 7"  (1.702 m)   Wt 192 lb 9.6 oz (87.4 kg)   SpO2 99%   BMI 30.17 kg/m    Wt Readings from Last 3 Encounters:  04/12/21 192 lb 9.6 oz (87.4 kg)  02/08/21 180 lb (81.6 kg)  01/25/21 180 lb (81.6 kg)    General: Well nourished, well developed, in no  acute distress Head: Atraumatic, normal size  Eyes: PEERLA, EOMI  Neck: Supple, no JVD Endocrine: No thryomegaly Cardiac: Normal S1, S2; RRR; no murmurs, rubs, or gallops Lungs: Clear to auscultation bilaterally, no wheezing, rhonchi or rales  Abd: Soft, nontender, no hepatomegaly  Ext: No edema, pulses 2+ Musculoskeletal: No deformities, BUE and BLE strength normal and equal Skin: Warm and dry, no rashes   Neuro: Alert and oriented to person, place, time, and situation, CNII-XII grossly intact, no focal deficits  Psych: Normal mood and affect   ASSESSMENT:   JACHIN COURY is a 49 y.o. male who presents for the following: 1. Coronary artery disease involving native coronary artery of native heart without angina pectoris   2. ICAO (internal carotid artery occlusion), right   3. Mixed hyperlipidemia     PLAN:   1. Coronary artery disease involving native coronary artery of native heart without angina pectoris -Three-vessel CAD.  85% mid LAD, 80% diagonal lesion, 80% mid to distal RCA.  EF is normal 55-60% with distal apical anterior wall motion abnormality.  He was evaluated by CT surgery but has been turned down due to weakness.  He informs me he is still not that active.  CT surgery has recommended PCI.  I think this is the best course of action to proceed with this.  We will set him up the week of October 10 for this.  I will reach back out to [employer] [employer] who can hopefully do the procedure. -No symptoms of angina.   Continue aspirin.  He has nitroglycerin as needed. -He cannot tolerate Lipitor or Crestor.  We will refer him to the pharmacy clinic for PCSK9 inhibitor therapy.  Most recent LDL was 171 which is not at goal. -Risk and benefits of left heart catheterization discussed.  He is willing to proceed.  2. ICAO (internal carotid artery occlusion), right -100% occluded right internal carotid.  Minimal disease in the left.  Continue to monitor for now.  3. Mixed hyperlipidemia -Referral to pharmacy for PCSK9 inhibitor.  LDL is not at goal.  Cannot tolerate Lipitor or Crestor.  4. Aortic Aneurysm  -42 mm ascending aorta.  We will continue to monitor this with yearly CTs.  Shared Decision Making/Informed Consent The risks [stroke (1 in 1000), death (1 in 1000), kidney failure [usually temporary] (1 in 500), bleeding (1 in 200), allergic reaction [possibly serious] (1 in 200)], benefits (diagnostic support and management of coronary artery disease) and alternatives of a cardiac catheterization were discussed in detail with Mr. Omalley and he is willing to proceed.  Disposition: Return in about 1 month (around 05/12/2021).  Medication Adjustments/Labs and Tests Ordered: Current medicines are reviewed at length with the patient today.  Concerns regarding medicines are outlined above.  Orders Placed This Encounter  Procedures   CBC   Basic metabolic panel    No orders of the defined types were placed in this encounter.   Patient Instructions  Medication Instructions:  The current medical regimen is effective;  continue present plan and medications.  *If you need a refill on your cardiac medications before your next appointment, please call your pharmacy*   Lab Work: CBC, BMET (in the next week.)  If you have labs (blood work) drawn today and your tests are completely normal, you will receive your results only by: [place] (if you have MyChart) OR A paper copy in the mail If you have  any lab test that is abnormal or we need to change  your treatment, we will call you to review the results.   Testing/Procedures: Your physician has requested that you have a cardiac catheterization. Cardiac catheterization is used to diagnose and/or treat various heart conditions. Doctors may recommend this procedure for a number of different reasons. The most common reason is to evaluate chest pain. Chest pain can be a symptom of coronary artery disease (CAD), and cardiac catheterization can show whether plaque is narrowing or blocking your heart's arteries. This procedure is also used to evaluate the valves, as well as measure the blood flow and oxygen levels in different parts of your heart. For further information please visit [email or web address].[email or web address]. Please follow instruction sheet, as given.   Follow-Up: At [facility], you and your health needs are our priority.  As part of our continuing mission to provide you with exceptional heart care, we have created designated Provider Care Teams.  These Care Teams include your primary Cardiologist (physician) and Advanced Practice Providers (APPs -  Physician Assistants and Nurse Practitioners) who all work together to provide you with the care you need, when you need it.  We recommend signing up for the patient portal called "MyChart".  Sign up information is provided on this After Visit Summary.  MyChart is used to connect with patients for Virtual Visits (Telemedicine).  Patients are able to view lab/test results, encounter notes, upcoming appointments, etc.  Non-urgent messages can be sent to your provider as well.   To learn more about what you can do with MyChart, go to [email or web address].    Your next appointment:   2 week(s)  The format for your next appointment:   In Person  Provider:   Owens Shark, NP or Coletta Memos, NP    FOLLOW UP: in 6 months with Dr.[name]   Other Instructions  [place] [id number] Dept: [place]  04/12/2021  You are scheduled for a Cardiac Catheterization on Tuesday, October 11 with Dr. Daneen Schick.  1. Please arrive at the [facility] (Main Entrance A) at [facility]: [address] [place], [place] [address] at 5:30 AM (This time is two hours before your procedure to ensure your preparation). Free valet parking service is available.   Special note: Every effort is made to have your procedure done on time. Please understand that emergencies sometimes delay scheduled procedures.  2. Diet: Do not eat solid foods after midnight.  The patient may have clear liquids until 5am upon the day of the procedure.  3. Labs: You will need to have blood drawn on CBC, BMET. You do not need to be fasting.  4. Medication instructions in preparation for your procedure:   Contrast Allergy: No   Do not take Diabetes Med Glucophage (Metformin) on the day of the procedure and HOLD 48 HOURS AFTER THE PROCEDURE.  On the morning of your procedure, take your Aspirin and any morning medicines NOT listed above.  You may use sips of water.  5. Plan for one night stay--bring personal belongings. 6. Bring a current list of your medications and current insurance cards. 7. You MUST have a responsible person to drive you home. 8. Someone MUST be with you the first 24 hours after you arrive home or your discharge will be delayed. 9. Please wear clothes that are easy to get on and off and wear slip-on shoes.  Thank you for allowing [place] to care for you!   --  [place] [facility]    Time Spent with Patient: I have spent a total of 35 minutes with patient reviewing hospital notes, telemetry, EKGs, labs and examining the patient as well as establishing an assessment and plan that was discussed with the patient.  > 50% of time was spent in  direct patient care.  Signed, Addison Naegeli. Audie Box, MD, [place]  [address] [place], [place] [address] [phone]  04/12/2021 3:52 PM

## 2021-04-12 ENCOUNTER — Encounter (HOSPITAL_BASED_OUTPATIENT_CLINIC_OR_DEPARTMENT_OTHER): Payer: Self-pay | Admitting: Cardiovascular Disease

## 2021-04-12 ENCOUNTER — Other Ambulatory Visit: Payer: Self-pay | Admitting: Cardiovascular Disease

## 2021-04-12 ENCOUNTER — Other Ambulatory Visit: Payer: Self-pay

## 2021-04-12 ENCOUNTER — Ambulatory Visit (HOSPITAL_BASED_OUTPATIENT_CLINIC_OR_DEPARTMENT_OTHER): Payer: Medicare HMO | Admitting: Cardiovascular Disease

## 2021-04-12 VITALS — BP 121/82 | HR 66 | Ht 67.0 in | Wt 192.6 lb

## 2021-04-12 DIAGNOSIS — I2510 Atherosclerotic heart disease of native coronary artery without angina pectoris: Secondary | ICD-10-CM

## 2021-04-12 DIAGNOSIS — E782 Mixed hyperlipidemia: Secondary | ICD-10-CM | POA: Diagnosis not present

## 2021-04-12 DIAGNOSIS — I6521 Occlusion and stenosis of right carotid artery: Secondary | ICD-10-CM | POA: Diagnosis not present

## 2021-04-12 NOTE — Patient Instructions (Signed)
Medication Instructions:  The current medical regimen is effective;  continue present plan and medications.  *If you need a refill on your cardiac medications before your next appointment, please call your pharmacy*   Lab Work: CBC, BMET (in the next week.)  If you have labs (blood work) drawn today and your tests are completely normal, you will receive your results only by: [place] (if you have MyChart) OR A paper copy in the mail If you have any lab test that is abnormal or we need to change your treatment, we will call you to review the results.   Testing/Procedures: Your physician has requested that you have a cardiac catheterization. Cardiac catheterization is used to diagnose and/or treat various heart conditions. Doctors may recommend this procedure for a number of different reasons. The most common reason is to evaluate chest pain. Chest pain can be a symptom of coronary artery disease (CAD), and cardiac catheterization can show whether plaque is narrowing or blocking your heart's arteries. This procedure is also used to evaluate the valves, as well as measure the blood flow and oxygen levels in different parts of your heart. For further information please visit HugeFiesta.[email or web address]. Please follow instruction sheet, as given.   Follow-Up: At [facility], you and your health needs are our priority.  As part of our continuing mission to provide you with exceptional heart care, we have created designated Provider Care Teams.  These Care Teams include your primary Cardiologist (physician) and Advanced Practice Providers (APPs -  Physician Assistants and Nurse Practitioners) who all work together to provide you with the care you need, when you need it.  We recommend signing up for the patient portal called "MyChart".  Sign up information is provided on this After Visit Summary.  MyChart is used to connect with patients for Virtual Visits (Telemedicine).  Patients are able to view  lab/test results, encounter notes, upcoming appointments, etc.  Non-urgent messages can be sent to your provider as well.   To learn more about what you can do with MyChart, go to [email or web address].    Your next appointment:   2 week(s)  The format for your next appointment:   In Person  Provider:   Owens Shark, NP or Coletta Memos, NP    FOLLOW UP: in 6 months with Dr.[name]   Other Instructions  [place] [id number] Dept: [place]  04/12/2021  You are scheduled for a Cardiac Catheterization on Tuesday, October 11 with Dr. Daneen Schick.  1. Please arrive at the [facility] (Main Entrance A) at [facility]: [address] [place], [place] [address] at 5:30 AM (This time is two hours before your procedure to ensure your preparation). Free valet parking service is available.   Special note: Every effort is made to have your procedure done on time. Please understand that emergencies sometimes delay scheduled procedures.  2. Diet: Do not eat solid foods after midnight.  The patient may have clear liquids until 5am upon the day of the procedure.  3. Labs: You will need to have blood drawn on CBC, BMET. You do not need to be fasting.  4. Medication instructions in preparation for your procedure:   Contrast Allergy: No   Do not take Diabetes Med Glucophage (Metformin) on the day of the procedure and HOLD 48 HOURS AFTER THE PROCEDURE.  On the morning of your procedure, take your Aspirin and any morning  medicines NOT listed above.  You may use sips of water.  5. Plan for one night stay--bring personal belongings. 6. Bring a current list of your medications and current insurance cards. 7. You MUST have a responsible person to drive you home. 8. Someone MUST be with you the first 24 hours after you arrive home or your discharge will be  delayed. 9. Please wear clothes that are easy to get on and off and wear slip-on shoes.  Thank you for allowing [place] to care for you!   -- [place] [facility]

## 2021-04-16 ENCOUNTER — Telehealth: Payer: Self-pay | Admitting: Cardiovascular Disease

## 2021-04-16 NOTE — Telephone Encounter (Signed)
Jeff Crome, MD  [name], Jeff Freer, MD; Jeff Racer, RN Thanks for keeping me in the loop. Seems Jeff Kelley felt rehab and if got stronger, perhaps surgery. If symptomatic PCI. No current symptoms according to records.   Jeff Kelley, please get the patient in to talk with me about procedure if possible. Need to discuss risk and goals of care (i.e., won't necessarily feel better (since not having angina) and hope with stents we avert downstream events but will need to be on good preventive therapy and able to use DAPT.    Called pt earlier and left message to call back.  Called pt again and spoke with him and reviewed information.  Pt states he doesn't think he can come in tomorrow because he relies on someone else for a ride and she has to work tomorrow.  Pt states Jeff Kelley is who brings him.  Asked if it would easier if I called her to make the appt since she is his transportation and he said yes.  Called Jeff Kelley and left message to call back.

## 2021-04-17 ENCOUNTER — Encounter: Payer: Self-pay | Admitting: Interventional Cardiology

## 2021-04-17 ENCOUNTER — Ambulatory Visit: Payer: Medicare HMO | Admitting: Interventional Cardiology

## 2021-04-17 ENCOUNTER — Other Ambulatory Visit: Payer: Self-pay

## 2021-04-17 VITALS — BP 118/90 | HR 79 | Ht 67.0 in | Wt 192.0 lb

## 2021-04-17 DIAGNOSIS — E782 Mixed hyperlipidemia: Secondary | ICD-10-CM

## 2021-04-17 DIAGNOSIS — I6521 Occlusion and stenosis of right carotid artery: Secondary | ICD-10-CM

## 2021-04-17 DIAGNOSIS — I2510 Atherosclerotic heart disease of native coronary artery without angina pectoris: Secondary | ICD-10-CM

## 2021-04-17 DIAGNOSIS — E118 Type 2 diabetes mellitus with unspecified complications: Secondary | ICD-10-CM | POA: Diagnosis not present

## 2021-04-17 MED ORDER — CLOPIDOGREL BISULFATE 75 MG PO TABS: 75.0000 mg | ORAL_TABLET | Freq: Every day | ORAL | 3 refills | Status: DC

## 2021-04-17 MED ORDER — PANTOPRAZOLE SODIUM 40 MG PO TBEC: 40.0000 mg | DELAYED_RELEASE_TABLET | Freq: Every day | ORAL | 3 refills | Status: DC

## 2021-04-17 NOTE — Patient Instructions (Signed)
Medication Instructions:  1) Take Plavix 150mg  (2 tablets) tomorrow and then on Friday just take one tablet daily. 2) DISCONTINUE Nexium 3) START Protonix 40mg  once daily  *If you need a refill on your cardiac medications before your next appointment, please call your pharmacy*   Lab Work: BMET and CBC today  If you have labs (blood work) drawn today and your tests are completely normal, you will receive your results only by: [place] (if you have MyChart) OR A paper copy in the mail If you have any lab test that is abnormal or we need to change your treatment, we will call you to review the results.   Testing/Procedures: None   Follow-Up: At [facility], you and your health needs are our priority.  As part of our continuing mission to provide you with exceptional heart care, we have created designated Provider Care Teams.  These Care Teams include your primary Cardiologist (physician) and Advanced Practice Providers (APPs -  Physician Assistants and Nurse Practitioners) who all work together to provide you with the care you need, when you need it.  We recommend signing up for the patient portal called "MyChart".  Sign up information is provided on this After Visit Summary.  MyChart is used to connect with patients for Virtual Visits (Telemedicine).  Patients are able to view lab/test results, encounter notes, upcoming appointments, etc.  Non-urgent messages can be sent to your provider as well.   To learn more about what you can do with MyChart, go to [email or web address].    Keep current appointments as scheduled  Other Instructions

## 2021-04-17 NOTE — Telephone Encounter (Signed)
Late entry from 10/4:  Anderson [place] called back and scheduled pt to come in and see Dr. Tamala Julian on 10/5

## 2021-04-17 NOTE — H&P (View-Only) (Signed)
Cardiology Office Note:    Date:  04/17/2021   ID:  Jeff Kelley, DOB Dec 04, 1970, MRN [id number]  PCP:  Camillia Herter, NP  Cardiologist:  Evalina Field, MD   Referring MD: Camillia Herter, NP   Chief Complaint  Patient presents with   Coronary Artery Disease     History of Present Illness:    Jeff Kelley is a 50 y.o. male with a hx of  2-vessel CAD (LAD, RCA, and ramus intermedius), DM, colon CA, seizure d/o.  He has been seen by Dr. Roxan Hockey for initial consultation concerning surgical revascularization.  He was turned down due to deconditioning.  He is doing much better now but still using a walker.  He denies anginal complaints although he does have some shortness of breath which could be an equivalent.   We discussed the planned procedure.  We discussed the indication for stenting's to control symptoms in the setting of angina.  Stents do not prolong survival and chronic coronary disease as occurs with coronary artery bypass grafting.  We discussed the procedure and risks involved including bleeding, stroke, risk of emergency surgery, death, bleeding, and kidney injury.  The patient is familiar with the procedure as he underwent the diagnostic procedure which was done in the same manner as the planned stent will be done.  Past Medical History:  Diagnosis Date   Colon cancer ([place])    Coronary artery disease    Diabetes mellitus without complication ([place])    Hyperlipidemia    Hypertension    Seizures ([place])     Past Surgical History:  Procedure Laterality Date   BIOPSY  09/24/2020   Procedure: BIOPSY;  Surgeon: Juanita Craver, MD;  Location: [place];  Service: Endoscopy;;   COLONOSCOPY WITH PROPOFOL N/A 09/24/2020   Procedure: COLONOSCOPY WITH PROPOFOL;  Surgeon: Juanita Craver, MD;  Location: [facility] ENDOSCOPY;  Service: Endoscopy;  Laterality: N/A;   ESOPHAGEAL BRUSHING  09/24/2020   Procedure: ESOPHAGEAL BRUSHING;  Surgeon: Juanita Craver, MD;  Location: [facility]  ENDOSCOPY;  Service: Endoscopy;;   ESOPHAGOGASTRODUODENOSCOPY (EGD) WITH PROPOFOL N/A 09/24/2020   Procedure: ESOPHAGOGASTRODUODENOSCOPY (EGD) WITH PROPOFOL;  Surgeon: Juanita Craver, MD;  Location: [facility] ENDOSCOPY;  Service: Endoscopy;  Laterality: N/A;   LAPAROSCOPIC PARTIAL COLECTOMY N/A 09/27/2020   Procedure: LAPAROSCOPIC ASSISTED  PARTIAL COLECTOMY;  Surgeon: Jovita Kussmaul, MD;  Location: [place];  Service: General;  Laterality: N/A;   LEFT HEART CATH AND CORONARY ANGIOGRAPHY N/A 11/09/2020   Procedure: LEFT HEART CATH AND CORONARY ANGIOGRAPHY;  Surgeon: Belva Crome, MD;  Location: [place] CV LAB;  Service: Cardiovascular;  Laterality: N/A;   POLYPECTOMY  09/24/2020   Procedure: POLYPECTOMY;  Surgeon: Juanita Craver, MD;  Location: [place] ENDOSCOPY;  Service: Endoscopy;;    Current Medications: Current Meds  Medication Sig   acetaminophen (TYLENOL) 500 MG tablet Take 1,000 mg by mouth every 6 (six) hours as needed for headache (pain).   aspirin 81 MG EC tablet Take 1 tablet (81 mg total) by mouth daily. Swallow whole.   Cholecalciferol (VITAMIN D3) 125 MCG (5000 UT) CAPS Take 5,000 Units by mouth daily.   clopidogrel (PLAVIX) 75 MG tablet Take 1 tablet (75 mg total) by mouth daily.   ferrous sulfate 325 (65 FE) MG tablet Take 1 tablet (325 mg total) by mouth daily with breakfast.   levETIRAcetam (KEPPRA) 750 MG tablet Take 750 mg by mouth daily. Previous dose 2 tablets (1500 mg) by mouth twice daily - reduce dose starting  09/21/2020 - take 1 1/2 tablets (1125 mg) by mouth twice daily for 3-4 weeks then take 1 tablet (750 mg) twice daily, then consult MD for further instructions.   LORazepam (ATIVAN) 1 MG tablet Take 1 mg by mouth 2 (two) times daily.   metoprolol tartrate (LOPRESSOR) 25 MG tablet TAKE 1 TABLET BY MOUTH TWICE A DAY   Multiple Vitamin (MULTIVITAMIN WITH MINERALS) TABS tablet Take 1 tablet by mouth daily.   pantoprazole (PROTONIX) 40 MG tablet Take 1 tablet (40 mg total) by mouth  daily.   PHENobarbital (LUMINAL) 64.8 MG tablet Take 64.8 mg by mouth 2 (two) times daily.   psyllium (HYDROCIL/METAMUCIL) 95 % PACK Take 1 packet by mouth daily.   TRUEplus Lancets 28G MISC Use as directed   [DISCONTINUED] esomeprazole (NEXIUM) 20 MG capsule Take 20 mg by mouth daily.     Allergies:   Atorvastatin, Codeine, and Penicillins   Social History   Socioeconomic History   Marital status: Unknown    Spouse name: Not on file   Number of children: Not on file   Years of education: Not on file   Highest education level: Not on file  Occupational History   Not on file  Tobacco Use   Smoking status: Former   Smokeless tobacco: Never  Vaping Use   Vaping Use: Never used  Substance and Sexual Activity   Alcohol use: Not Currently   Drug use: Not Currently   Sexual activity: Not on file  Other Topics Concern   Not on file  Social History Narrative   Not on file   Social Determinants of Health   Financial Resource Strain: Not on file  Food Insecurity: Not on file  Transportation Needs: Not on file  Physical Activity: Not on file  Stress: Not on file  Social Connections: Not on file     Family History: The patient's family history includes Diabetes in his mother; Heart attack in his mother; Lung cancer in his father.  ROS:   Please see the history of present illness.    He seems to be more physically fit now.  He is walking at home.  He denies angina.  All other systems reviewed and are negative.  EKGs/Labs/Other Studies Reviewed:    The following studies were reviewed today: No new data.  EKG:  EKG normal sinus rhythm without evidence of infarction.  Recent Labs: 09/21/2020: B Natriuretic Peptide 297.0 10/05/2020: Magnesium 1.8 01/18/2021: ALT 21; BUN 11; Creatinine 0.70; Hemoglobin 15.5; Platelet Count 282; Potassium 4.3; Sodium 137 02/08/2021: TSH 3.490  Recent Lipid Panel    Component Value Date/Time   CHOL 258 (H) 02/08/2021 1150   TRIG 187 (H)  02/08/2021 1150   HDL 53 02/08/2021 1150   CHOLHDL 4.9 02/08/2021 1150   CHOLHDL 5.7 09/24/2020 0323   VLDL 16 09/24/2020 0323   LDLCALC 171 (H) 02/08/2021 1150    Physical Exam:    VS:  BP 118/90   Pulse 79   Ht 5\' 7"  (1.702 m)   Wt 192 lb (87.1 kg)   SpO2 97%   BMI 30.07 kg/m     Wt Readings from Last 3 Encounters:  04/17/21 192 lb (87.1 kg)  04/12/21 192 lb 9.6 oz (87.4 kg)  02/08/21 180 lb (81.6 kg)     GEN: Overweight. No acute distress HEENT: Normal NECK: No JVD. LYMPHATICS: No lymphadenopathy CARDIAC: No murmur. RRR no gallop, or edema. VASCULAR:  Normal Pulses. No bruits. RESPIRATORY:  Clear to auscultation without  rales, wheezing or rhonchi  ABDOMEN: Soft, non-tender, non-distended, No pulsatile mass, MUSCULOSKELETAL: No deformity  SKIN: Warm and dry NEUROLOGIC:  Alert and oriented x 3 PSYCHIATRIC:  Normal affect   ASSESSMENT:    1. Coronary artery disease involving native coronary artery of native heart without angina pectoris   2. ICAO (internal carotid artery occlusion), right   3. Mixed hyperlipidemia   4. Type 2 diabetes mellitus with complication, without long-term current use of insulin (HCC)    PLAN:    In order of problems listed above:  We will proceed with multivessel PCI (RCA and LAD) at slightly higher procedural risk.  Load the patient with Plavix starting today.  Switch to pantoprazole.  The patient was counseled to undergo left heart catheterization, coronary angiography, and multivessel percutaneous coronary intervention with stent implantation.  The multivessel nature of the procedure increases the procedural risk.  Overall risk and benefits was discussed in detail. The risks discussed included death, stroke, myocardial infarction, life-threatening bleeding, limb ischemia, kidney injury, allergy, and possible emergency cardiac surgery. The risk of these significant complications were estimated to occur less than 5 % of the time in his  clinical subset.. After discussion, the patient has agreed to proceed.  He will not receive the most important benefit of stenting which is symptom relief since he is not currently experiencing angina unless dyspnea which seems to be improving is an anginal equivalent.    Medication Adjustments/Labs and Tests Ordered: Current medicines are reviewed at length with the patient today.  Concerns regarding medicines are outlined above.  Orders Placed This Encounter  Procedures   Basic metabolic panel   CBC   EKG 12-Lead   Meds ordered this encounter  Medications   clopidogrel (PLAVIX) 75 MG tablet    Sig: Take 1 tablet (75 mg total) by mouth daily.    Dispense:  90 tablet    Refill:  3   pantoprazole (PROTONIX) 40 MG tablet    Sig: Take 1 tablet (40 mg total) by mouth daily.    Dispense:  90 tablet    Refill:  3    D/c Nexium due to plavix    Patient Instructions  Medication Instructions:  1) Take Plavix 150mg  (2 tablets) tomorrow and then on Friday just take one tablet daily. 2) DISCONTINUE Nexium 3) START Protonix 40mg  once daily  *If you need a refill on your cardiac medications before your next appointment, please call your pharmacy*   Lab Work: BMET and CBC today  If you have labs (blood work) drawn today and your tests are completely normal, you will receive your results only by: [place] (if you have MyChart) OR A paper copy in the mail If you have any lab test that is abnormal or we need to change your treatment, we will call you to review the results.   Testing/Procedures: None   Follow-Up: At [facility], you and your health needs are our priority.  As part of our continuing mission to provide you with exceptional heart care, we have created designated Provider Care Teams.  These Care Teams include your primary Cardiologist (physician) and Advanced Practice Providers (APPs -  Physician Assistants and Nurse Practitioners) who all work together to provide  you with the care you need, when you need it.  We recommend signing up for the patient portal called "MyChart".  Sign up information is provided on this After Visit Summary.  MyChart is used to connect with patients for Virtual Visits (Telemedicine).  Patients are able to view lab/test results, encounter notes, upcoming appointments, etc.  Non-urgent messages can be sent to your provider as well.   To learn more about what you can do with MyChart, go to [email or web address].    Keep current appointments as scheduled  Other Instructions      Signed, Sinclair Grooms, MD  04/17/2021 5:25 PM    [place]

## 2021-04-17 NOTE — Progress Notes (Addendum)
Cardiology Office Note:    Date:  04/17/2021   ID:  Jeff Kelley, DOB 1971/04/11, MRN [id number]  PCP:  Camillia Herter, NP  Cardiologist:  Evalina Field, MD   Referring MD: Camillia Herter, NP   Chief Complaint  Patient presents with   Coronary Artery Disease     History of Present Illness:    Jeff Kelley is a 50 y.o. male with a hx of  2-vessel CAD (LAD, RCA, and ramus intermedius), DM, colon CA, seizure d/o.  He has been seen by Dr. Roxan Hockey for initial consultation concerning surgical revascularization.  He was turned down due to deconditioning.  He is doing much better now but still using a walker.  He denies anginal complaints although he does have some shortness of breath which could be an equivalent.   We discussed the planned procedure.  We discussed the indication for stenting's to control symptoms in the setting of angina.  Stents do not prolong survival and chronic coronary disease as occurs with coronary artery bypass grafting.  We discussed the procedure and risks involved including bleeding, stroke, risk of emergency surgery, death, bleeding, and kidney injury.  The patient is familiar with the procedure as he underwent the diagnostic procedure which was done in the same manner as the planned stent will be done.  Past Medical History:  Diagnosis Date   Colon cancer ([place])    Coronary artery disease    Diabetes mellitus without complication ([place])    Hyperlipidemia    Hypertension    Seizures ([place])     Past Surgical History:  Procedure Laterality Date   BIOPSY  09/24/2020   Procedure: BIOPSY;  Surgeon: Juanita Craver, MD;  Location: [place];  Service: Endoscopy;;   COLONOSCOPY WITH PROPOFOL N/A 09/24/2020   Procedure: COLONOSCOPY WITH PROPOFOL;  Surgeon: Juanita Craver, MD;  Location: [facility] ENDOSCOPY;  Service: Endoscopy;  Laterality: N/A;   ESOPHAGEAL BRUSHING  09/24/2020   Procedure: ESOPHAGEAL BRUSHING;  Surgeon: Juanita Craver, MD;  Location: [facility]  ENDOSCOPY;  Service: Endoscopy;;   ESOPHAGOGASTRODUODENOSCOPY (EGD) WITH PROPOFOL N/A 09/24/2020   Procedure: ESOPHAGOGASTRODUODENOSCOPY (EGD) WITH PROPOFOL;  Surgeon: Juanita Craver, MD;  Location: [facility] ENDOSCOPY;  Service: Endoscopy;  Laterality: N/A;   LAPAROSCOPIC PARTIAL COLECTOMY N/A 09/27/2020   Procedure: LAPAROSCOPIC ASSISTED  PARTIAL COLECTOMY;  Surgeon: Jovita Kussmaul, MD;  Location: [place];  Service: General;  Laterality: N/A;   LEFT HEART CATH AND CORONARY ANGIOGRAPHY N/A 11/09/2020   Procedure: LEFT HEART CATH AND CORONARY ANGIOGRAPHY;  Surgeon: Belva Crome, MD;  Location: [place] CV LAB;  Service: Cardiovascular;  Laterality: N/A;   POLYPECTOMY  09/24/2020   Procedure: POLYPECTOMY;  Surgeon: Juanita Craver, MD;  Location: [place] ENDOSCOPY;  Service: Endoscopy;;    Current Medications: Current Meds  Medication Sig   acetaminophen (TYLENOL) 500 MG tablet Take 1,000 mg by mouth every 6 (six) hours as needed for headache (pain).   aspirin 81 MG EC tablet Take 1 tablet (81 mg total) by mouth daily. Swallow whole.   Cholecalciferol (VITAMIN D3) 125 MCG (5000 UT) CAPS Take 5,000 Units by mouth daily.   clopidogrel (PLAVIX) 75 MG tablet Take 1 tablet (75 mg total) by mouth daily.   ferrous sulfate 325 (65 FE) MG tablet Take 1 tablet (325 mg total) by mouth daily with breakfast.   levETIRAcetam (KEPPRA) 750 MG tablet Take 750 mg by mouth daily. Previous dose 2 tablets (1500 mg) by mouth twice daily - reduce dose starting  09/21/2020 - take 1 1/2 tablets (1125 mg) by mouth twice daily for 3-4 weeks then take 1 tablet (750 mg) twice daily, then consult MD for further instructions.   LORazepam (ATIVAN) 1 MG tablet Take 1 mg by mouth 2 (two) times daily.   metoprolol tartrate (LOPRESSOR) 25 MG tablet TAKE 1 TABLET BY MOUTH TWICE A DAY   Multiple Vitamin (MULTIVITAMIN WITH MINERALS) TABS tablet Take 1 tablet by mouth daily.   pantoprazole (PROTONIX) 40 MG tablet Take 1 tablet (40 mg total) by mouth  daily.   PHENobarbital (LUMINAL) 64.8 MG tablet Take 64.8 mg by mouth 2 (two) times daily.   psyllium (HYDROCIL/METAMUCIL) 95 % PACK Take 1 packet by mouth daily.   TRUEplus Lancets 28G MISC Use as directed   [DISCONTINUED] esomeprazole (NEXIUM) 20 MG capsule Take 20 mg by mouth daily.     Allergies:   Atorvastatin, Codeine, and Penicillins   Social History   Socioeconomic History   Marital status: Unknown    Spouse name: Not on file   Number of children: Not on file   Years of education: Not on file   Highest education level: Not on file  Occupational History   Not on file  Tobacco Use   Smoking status: Former   Smokeless tobacco: Never  Vaping Use   Vaping Use: Never used  Substance and Sexual Activity   Alcohol use: Not Currently   Drug use: Not Currently   Sexual activity: Not on file  Other Topics Concern   Not on file  Social History Narrative   Not on file   Social Determinants of Health   Financial Resource Strain: Not on file  Food Insecurity: Not on file  Transportation Needs: Not on file  Physical Activity: Not on file  Stress: Not on file  Social Connections: Not on file     Family History: The patient's family history includes Diabetes in his mother; Heart attack in his mother; Lung cancer in his father.  ROS:   Please see the history of present illness.    He seems to be more physically fit now.  He is walking at home.  He denies angina.  All other systems reviewed and are negative.  EKGs/Labs/Other Studies Reviewed:    The following studies were reviewed today: No new data.  EKG:  EKG normal sinus rhythm without evidence of infarction.  Recent Labs: 09/21/2020: B Natriuretic Peptide 297.0 10/05/2020: Magnesium 1.8 01/18/2021: ALT 21; BUN 11; Creatinine 0.70; Hemoglobin 15.5; Platelet Count 282; Potassium 4.3; Sodium 137 02/08/2021: TSH 3.490  Recent Lipid Panel    Component Value Date/Time   CHOL 258 (H) 02/08/2021 1150   TRIG 187 (H)  02/08/2021 1150   HDL 53 02/08/2021 1150   CHOLHDL 4.9 02/08/2021 1150   CHOLHDL 5.7 09/24/2020 0323   VLDL 16 09/24/2020 0323   LDLCALC 171 (H) 02/08/2021 1150    Physical Exam:    VS:  BP 118/90   Pulse 79   Ht 5\' 7"  (1.702 m)   Wt 192 lb (87.1 kg)   SpO2 97%   BMI 30.07 kg/m     Wt Readings from Last 3 Encounters:  04/17/21 192 lb (87.1 kg)  04/12/21 192 lb 9.6 oz (87.4 kg)  02/08/21 180 lb (81.6 kg)     GEN: Overweight. No acute distress HEENT: Normal NECK: No JVD. LYMPHATICS: No lymphadenopathy CARDIAC: No murmur. RRR no gallop, or edema. VASCULAR:  Normal Pulses. No bruits. RESPIRATORY:  Clear to auscultation without  rales, wheezing or rhonchi  ABDOMEN: Soft, non-tender, non-distended, No pulsatile mass, MUSCULOSKELETAL: No deformity  SKIN: Warm and dry NEUROLOGIC:  Alert and oriented x 3 PSYCHIATRIC:  Normal affect   ASSESSMENT:    1. Coronary artery disease involving native coronary artery of native heart without angina pectoris   2. ICAO (internal carotid artery occlusion), right   3. Mixed hyperlipidemia   4. Type 2 diabetes mellitus with complication, without long-term current use of insulin (HCC)    PLAN:    In order of problems listed above:  We will proceed with multivessel PCI (RCA and LAD) at slightly higher procedural risk.  Load the patient with Plavix starting today.  Switch to pantoprazole.  The patient was counseled to undergo left heart catheterization, coronary angiography, and multivessel percutaneous coronary intervention with stent implantation.  The multivessel nature of the procedure increases the procedural risk.  Overall risk and benefits was discussed in detail. The risks discussed included death, stroke, myocardial infarction, life-threatening bleeding, limb ischemia, kidney injury, allergy, and possible emergency cardiac surgery. The risk of these significant complications were estimated to occur less than 5 % of the time in his  clinical subset.. After discussion, the patient has agreed to proceed.  He will not receive the most important benefit of stenting which is symptom relief since he is not currently experiencing angina unless dyspnea which seems to be improving is an anginal equivalent.    Medication Adjustments/Labs and Tests Ordered: Current medicines are reviewed at length with the patient today.  Concerns regarding medicines are outlined above.  Orders Placed This Encounter  Procedures   Basic metabolic panel   CBC   EKG 12-Lead   Meds ordered this encounter  Medications   clopidogrel (PLAVIX) 75 MG tablet    Sig: Take 1 tablet (75 mg total) by mouth daily.    Dispense:  90 tablet    Refill:  3   pantoprazole (PROTONIX) 40 MG tablet    Sig: Take 1 tablet (40 mg total) by mouth daily.    Dispense:  90 tablet    Refill:  3    D/c Nexium due to plavix    Patient Instructions  Medication Instructions:  1) Take Plavix 150mg  (2 tablets) tomorrow and then on Friday just take one tablet daily. 2) DISCONTINUE Nexium 3) START Protonix 40mg  once daily  *If you need a refill on your cardiac medications before your next appointment, please call your pharmacy*   Lab Work: BMET and CBC today  If you have labs (blood work) drawn today and your tests are completely normal, you will receive your results only by: [place] (if you have MyChart) OR A paper copy in the mail If you have any lab test that is abnormal or we need to change your treatment, we will call you to review the results.   Testing/Procedures: None   Follow-Up: At [facility], you and your health needs are our priority.  As part of our continuing mission to provide you with exceptional heart care, we have created designated Provider Care Teams.  These Care Teams include your primary Cardiologist (physician) and Advanced Practice Providers (APPs -  Physician Assistants and Nurse Practitioners) who all work together to provide  you with the care you need, when you need it.  We recommend signing up for the patient portal called "MyChart".  Sign up information is provided on this After Visit Summary.  MyChart is used to connect with patients for Virtual Visits (Telemedicine).  Patients are able to view lab/test results, encounter notes, upcoming appointments, etc.  Non-urgent messages can be sent to your provider as well.   To learn more about what you can do with MyChart, go to [email or web address].    Keep current appointments as scheduled  Other Instructions      Signed, Sinclair Grooms, MD  04/17/2021 5:25 PM    [place]

## 2021-04-18 ENCOUNTER — Inpatient Hospital Stay: Payer: Medicare HMO | Admitting: Oncology

## 2021-04-18 ENCOUNTER — Inpatient Hospital Stay: Payer: Medicare HMO | Attending: Oncology

## 2021-04-18 LAB — CBC
Hematocrit: 43.1 % (ref 37.5–51.0)
Hemoglobin: 15 g/dL (ref 13.0–17.7)
MCH: 31.1 pg (ref 26.6–33.0)
MCHC: 34.8 g/dL (ref 31.5–35.7)
MCV: 89 fL (ref 79–97)
Platelets: 300 10*3/uL (ref 150–450)
RBC: 4.82 x10E6/uL (ref 4.14–5.80)
RDW: 13 % (ref 11.6–15.4)
WBC: 8.1 10*3/uL (ref 3.4–10.8)

## 2021-04-18 LAB — BASIC METABOLIC PANEL
BUN/Creatinine Ratio: 8 — ABNORMAL LOW (ref 9–20)
BUN: 7 mg/dL (ref 6–24)
CO2: 23 mmol/L (ref 20–29)
Calcium: 9.7 mg/dL (ref 8.7–10.2)
Chloride: 98 mmol/L (ref 96–106)
Creatinine, Ser: 0.85 mg/dL (ref 0.76–1.27)
Glucose: 71 mg/dL (ref 70–99)
Potassium: 4.4 mmol/L (ref 3.5–5.2)
Sodium: 136 mmol/L (ref 134–144)
eGFR: 105 mL/min/{1.73_m2} (ref >59)

## 2021-04-19 ENCOUNTER — Other Ambulatory Visit: Payer: Medicare HMO

## 2021-04-19 ENCOUNTER — Ambulatory Visit: Payer: Medicare HMO | Admitting: Oncology

## 2021-04-22 ENCOUNTER — Telehealth: Payer: Self-pay | Admitting: *Deleted

## 2021-04-22 NOTE — H&P (Signed)
Last cath 6 months ago. Plan LAD PCI, and RCA. No attempt on Ramus. Not on a statin. Needs PCSK-9

## 2021-04-22 NOTE — Telephone Encounter (Addendum)
Coronary Stent  scheduled at [facility] for: Tuesday April 23, 2021 7:30 AM [facility] Main Entrance [facility]) at: 5:30 AM   No solid food after midnight prior to cath, clear liquids until 5 AM day of procedure.  Medication instructions: Hold: Metformin-day of procedure and 48 hours post procedure  Except hold medications usual morning medications can be taken pre-cath with sips of water including: - aspirin 81 mg -Plavix 75 mg    Confirmed patient has responsible adult to drive home post procedure and be with patient first 24 hours after arriving home.  [facility] does allow one visitor to accompany you and wait in the hospital waiting room while you are there for your procedure. You and your visitor will be asked to wear a mask once you enter the hospital.   Patient reports does not currently have any symptoms concerning for COVID-19 and no household members with COVID-19 like illness.      Reviewed procedure/mask/visitor instructions with patient.

## 2021-04-23 ENCOUNTER — Ambulatory Visit (HOSPITAL_COMMUNITY)
Admission: RE | Disposition: A | Discharge status: Home / Self Care | Payer: Self-pay | Source: Home / Self Care | Attending: Interventional Cardiology

## 2021-04-23 ENCOUNTER — Ambulatory Visit (HOSPITAL_COMMUNITY)
Admission: RE | Admit: 2021-04-23 | Discharge: 2021-04-24 | Disposition: A | Discharge status: Home / Self Care | Payer: Medicare HMO | Attending: Interventional Cardiology | Admitting: Interventional Cardiology

## 2021-04-23 ENCOUNTER — Other Ambulatory Visit: Payer: Self-pay

## 2021-04-23 ENCOUNTER — Encounter (HOSPITAL_COMMUNITY): Payer: Self-pay | Admitting: Interventional Cardiology

## 2021-04-23 DIAGNOSIS — Z955 Presence of coronary angioplasty implant and graft: Secondary | ICD-10-CM

## 2021-04-23 DIAGNOSIS — Z7902 Long term (current) use of antithrombotics/antiplatelets: Secondary | ICD-10-CM | POA: Diagnosis not present

## 2021-04-23 DIAGNOSIS — E119 Type 2 diabetes mellitus without complications: Secondary | ICD-10-CM | POA: Diagnosis not present

## 2021-04-23 DIAGNOSIS — R7989 Other specified abnormal findings of blood chemistry: Secondary | ICD-10-CM | POA: Diagnosis present

## 2021-04-23 DIAGNOSIS — Z20822 Contact with and (suspected) exposure to covid-19: Secondary | ICD-10-CM | POA: Diagnosis not present

## 2021-04-23 DIAGNOSIS — I10 Essential (primary) hypertension: Secondary | ICD-10-CM

## 2021-04-23 DIAGNOSIS — Z79899 Other long term (current) drug therapy: Secondary | ICD-10-CM | POA: Insufficient documentation

## 2021-04-23 DIAGNOSIS — R778 Other specified abnormalities of plasma proteins: Secondary | ICD-10-CM | POA: Diagnosis present

## 2021-04-23 DIAGNOSIS — I25118 Atherosclerotic heart disease of native coronary artery with other forms of angina pectoris: Secondary | ICD-10-CM

## 2021-04-23 DIAGNOSIS — Z885 Allergy status to narcotic agent status: Secondary | ICD-10-CM | POA: Diagnosis not present

## 2021-04-23 DIAGNOSIS — I110 Hypertensive heart disease with heart failure: Secondary | ICD-10-CM | POA: Diagnosis not present

## 2021-04-23 DIAGNOSIS — I509 Heart failure, unspecified: Secondary | ICD-10-CM | POA: Diagnosis not present

## 2021-04-23 DIAGNOSIS — Z7982 Long term (current) use of aspirin: Secondary | ICD-10-CM | POA: Insufficient documentation

## 2021-04-23 DIAGNOSIS — Z880 Allergy status to penicillin: Secondary | ICD-10-CM | POA: Diagnosis not present

## 2021-04-23 DIAGNOSIS — E782 Mixed hyperlipidemia: Secondary | ICD-10-CM | POA: Diagnosis not present

## 2021-04-23 DIAGNOSIS — E785 Hyperlipidemia, unspecified: Secondary | ICD-10-CM

## 2021-04-23 DIAGNOSIS — Z8249 Family history of ischemic heart disease and other diseases of the circulatory system: Secondary | ICD-10-CM | POA: Diagnosis not present

## 2021-04-23 DIAGNOSIS — I2510 Atherosclerotic heart disease of native coronary artery without angina pectoris: Secondary | ICD-10-CM | POA: Diagnosis present

## 2021-04-23 DIAGNOSIS — I6521 Occlusion and stenosis of right carotid artery: Secondary | ICD-10-CM | POA: Insufficient documentation

## 2021-04-23 HISTORY — PX: CORONARY STENT INTERVENTION: CATH118234

## 2021-04-23 LAB — GLUCOSE, CAPILLARY
Glucose-Capillary: 87 mg/dL (ref 70–99)
Glucose-Capillary: 93 mg/dL (ref 70–99)
Glucose-Capillary: 101 mg/dL — ABNORMAL HIGH (ref 70–99)
Glucose-Capillary: 76 mg/dL (ref 70–99)
Glucose-Capillary: 106 mg/dL — ABNORMAL HIGH (ref 70–99)

## 2021-04-23 LAB — POCT ACTIVATED CLOTTING TIME
Activated Clotting Time: 289 seconds
Activated Clotting Time: 422 seconds
Activated Clotting Time: 265 seconds
Activated Clotting Time: 347 seconds
Activated Clotting Time: 671 seconds

## 2021-04-23 LAB — CREATININE, SERUM
Creatinine, Ser: 0.79 mg/dL (ref 0.61–1.24)
GFR, Estimated: >60 mL/min (ref >60)

## 2021-04-23 LAB — CBC
HCT: 42.6 % (ref 39.0–52.0)
Hemoglobin: 14.8 g/dL (ref 13.0–17.0)
MCH: 31.4 pg (ref 26.0–34.0)
MCHC: 34.7 g/dL (ref 30.0–36.0)
MCV: 90.4 fL (ref 80.0–100.0)
Platelets: 309 10*3/uL (ref 150–400)
RBC: 4.71 MIL/uL (ref 4.22–5.81)
RDW: 12.2 % (ref 11.5–15.5)
WBC: 7.9 10*3/uL (ref 4.0–10.5)
nRBC: 0 % (ref 0.0–0.2)

## 2021-04-23 LAB — SARS CORONAVIRUS 2 BY RT PCR (HOSPITAL ORDER, PERFORMED IN ~~LOC~~ HOSPITAL LAB): SARS Coronavirus 2: NEGATIVE

## 2021-04-23 SURGERY — CORONARY STENT INTERVENTION
Anesthesia: LOCAL

## 2021-04-23 MED ORDER — SODIUM CHLORIDE 0.9% FLUSH: 3.0000 mL | INTRAVENOUS | Status: DC | PRN

## 2021-04-23 MED ORDER — LORAZEPAM 1 MG PO TABS
1.0000 mg | ORAL_TABLET | Freq: Two times a day (BID) | ORAL | Status: DC
  Administered 2021-04-23 – 2021-04-24 (×2): 1 mg via ORAL
  Filled 2021-04-23 (×3): qty 1

## 2021-04-23 MED ORDER — NITROGLYCERIN 1 MG/10 ML FOR IR/CATH LAB
INTRA_ARTERIAL | Status: DC | PRN
  Administered 2021-04-23: 200 ug via INTRACORONARY

## 2021-04-23 MED ORDER — HEPARIN (PORCINE) IN NACL 1000-0.9 UT/500ML-% IV SOLN
INTRAVENOUS | Status: AC
  Filled 2021-04-23: qty 1000

## 2021-04-23 MED ORDER — METOPROLOL TARTRATE 25 MG PO TABS
25.0000 mg | ORAL_TABLET | Freq: Two times a day (BID) | ORAL | Status: DC
  Administered 2021-04-23 – 2021-04-24 (×3): 25 mg via ORAL
  Filled 2021-04-23 (×3): qty 1

## 2021-04-23 MED ORDER — FENTANYL CITRATE (PF) 100 MCG/2ML IJ SOLN
INTRAMUSCULAR | Status: DC | PRN
  Administered 2021-04-23: 50 ug via INTRAVENOUS
  Administered 2021-04-23: 25 ug via INTRAVENOUS

## 2021-04-23 MED ORDER — VERAPAMIL HCL 2.5 MG/ML IV SOLN
INTRAVENOUS | Status: DC | PRN
  Administered 2021-04-23: 10 mL via INTRA_ARTERIAL

## 2021-04-23 MED ORDER — SODIUM CHLORIDE 0.9% FLUSH
3.0000 mL | Freq: Two times a day (BID) | INTRAVENOUS | Status: DC
  Administered 2021-04-23 – 2021-04-24 (×2): 3 mL via INTRAVENOUS

## 2021-04-23 MED ORDER — LEVETIRACETAM 500 MG PO TABS
1500.0000 mg | ORAL_TABLET | Freq: Two times a day (BID) | ORAL | Status: DC
  Administered 2021-04-23 – 2021-04-24 (×2): 1500 mg via ORAL
  Filled 2021-04-23 (×3): qty 3

## 2021-04-23 MED ORDER — MIDAZOLAM HCL 2 MG/2ML IJ SOLN
INTRAMUSCULAR | Status: AC
  Filled 2021-04-23: qty 2

## 2021-04-23 MED ORDER — FERROUS SULFATE 325 (65 FE) MG PO TABS
325.0000 mg | ORAL_TABLET | Freq: Every day | ORAL | Status: DC
  Administered 2021-04-24: 325 mg via ORAL
  Filled 2021-04-23: qty 1

## 2021-04-23 MED ORDER — HEPARIN SODIUM (PORCINE) 1000 UNIT/ML IJ SOLN
INTRAMUSCULAR | Status: AC
  Filled 2021-04-23: qty 1

## 2021-04-23 MED ORDER — SODIUM CHLORIDE 0.9 % WEIGHT BASED INFUSION: 1.0000 mL/kg/h | INTRAVENOUS | Status: DC

## 2021-04-23 MED ORDER — CLOPIDOGREL BISULFATE 75 MG PO TABS
75.0000 mg | ORAL_TABLET | Freq: Once | ORAL | Status: AC
  Administered 2021-04-23: 75 mg via ORAL

## 2021-04-23 MED ORDER — ASPIRIN 81 MG PO CHEW: 81.0000 mg | CHEWABLE_TABLET | ORAL | Status: DC

## 2021-04-23 MED ORDER — HYDRALAZINE HCL 20 MG/ML IJ SOLN: 10.0000 mg | INTRAMUSCULAR | Status: AC | PRN

## 2021-04-23 MED ORDER — MIDAZOLAM HCL 2 MG/2ML IJ SOLN
INTRAMUSCULAR | Status: DC | PRN
  Administered 2021-04-23: .5 mg via INTRAVENOUS
  Administered 2021-04-23: 1 mg via INTRAVENOUS

## 2021-04-23 MED ORDER — PANTOPRAZOLE SODIUM 40 MG PO TBEC
40.0000 mg | DELAYED_RELEASE_TABLET | Freq: Every day | ORAL | Status: DC
Start: 2021-04-23 — End: 2021-04-24
  Administered 2021-04-23 – 2021-04-24 (×2): 40 mg via ORAL
  Filled 2021-04-23 (×2): qty 1

## 2021-04-23 MED ORDER — ADULT MULTIVITAMIN W/MINERALS CH
1.0000 | ORAL_TABLET | Freq: Every day | ORAL | Status: DC
  Administered 2021-04-24: 1 via ORAL
  Filled 2021-04-23: qty 1

## 2021-04-23 MED ORDER — CLOPIDOGREL BISULFATE 75 MG PO TABS
ORAL_TABLET | ORAL | Status: DC | PRN
  Administered 2021-04-23: 225 mg via ORAL

## 2021-04-23 MED ORDER — FAMOTIDINE IN NACL 20-0.9 MG/50ML-% IV SOLN
INTRAVENOUS | Status: DC | PRN
  Administered 2021-04-23: 20 mg via INTRAVENOUS

## 2021-04-23 MED ORDER — CLOPIDOGREL BISULFATE 75 MG PO TABS
75.0000 mg | ORAL_TABLET | Freq: Every day | ORAL | Status: DC
  Administered 2021-04-24: 75 mg via ORAL
  Filled 2021-04-23: qty 1

## 2021-04-23 MED ORDER — FENTANYL CITRATE (PF) 100 MCG/2ML IJ SOLN
INTRAMUSCULAR | Status: AC
  Filled 2021-04-23: qty 2

## 2021-04-23 MED ORDER — SODIUM CHLORIDE 0.9 % IV SOLN: 250.0000 mL | INTRAVENOUS | Status: DC | PRN

## 2021-04-23 MED ORDER — HEPARIN SODIUM (PORCINE) 5000 UNIT/ML IJ SOLN
5000.0000 [IU] | Freq: Three times a day (TID) | INTRAMUSCULAR | Status: DC
  Administered 2021-04-23 – 2021-04-24 (×2): 5000 [IU] via SUBCUTANEOUS
  Filled 2021-04-23 (×2): qty 1

## 2021-04-23 MED ORDER — IOHEXOL 350 MG/ML SOLN
INTRAVENOUS | Status: DC | PRN
  Administered 2021-04-23: 220 mL via INTRA_ARTERIAL

## 2021-04-23 MED ORDER — LIDOCAINE HCL (PF) 1 % IJ SOLN
INTRAMUSCULAR | Status: AC
  Filled 2021-04-23: qty 30

## 2021-04-23 MED ORDER — LAMOTRIGINE 100 MG PO TABS
400.0000 mg | ORAL_TABLET | Freq: Two times a day (BID) | ORAL | Status: DC
  Administered 2021-04-23 – 2021-04-24 (×2): 400 mg via ORAL
  Filled 2021-04-23 (×3): qty 4

## 2021-04-23 MED ORDER — LIDOCAINE HCL (PF) 1 % IJ SOLN
INTRAMUSCULAR | Status: DC | PRN
  Administered 2021-04-23: 2 mL via INTRADERMAL

## 2021-04-23 MED ORDER — SODIUM CHLORIDE 0.9% FLUSH: 3.0000 mL | Freq: Two times a day (BID) | INTRAVENOUS | Status: DC

## 2021-04-23 MED ORDER — ROSUVASTATIN CALCIUM 20 MG PO TABS
40.0000 mg | ORAL_TABLET | Freq: Every day | ORAL | Status: DC
  Administered 2021-04-24: 40 mg via ORAL
  Filled 2021-04-23 (×2): qty 2

## 2021-04-23 MED ORDER — PHENOBARBITAL 32.4 MG PO TABS
64.8000 mg | ORAL_TABLET | Freq: Two times a day (BID) | ORAL | Status: DC
  Administered 2021-04-23 – 2021-04-24 (×2): 64.8 mg via ORAL
  Filled 2021-04-23 (×3): qty 2

## 2021-04-23 MED ORDER — ASPIRIN EC 81 MG PO TBEC
81.0000 mg | DELAYED_RELEASE_TABLET | Freq: Every day | ORAL | Status: DC
  Administered 2021-04-24: 81 mg via ORAL
  Filled 2021-04-23: qty 1

## 2021-04-23 MED ORDER — ACETAMINOPHEN 325 MG PO TABS: 650.0000 mg | ORAL_TABLET | ORAL | Status: DC | PRN

## 2021-04-23 MED ORDER — CLOPIDOGREL BISULFATE 75 MG PO TABS
ORAL_TABLET | ORAL | Status: AC
  Filled 2021-04-23: qty 1

## 2021-04-23 MED ORDER — LABETALOL HCL 5 MG/ML IV SOLN
10.0000 mg | INTRAVENOUS | Status: AC | PRN
Start: 2021-04-23 — End: 2021-04-23

## 2021-04-23 MED ORDER — ASPIRIN 81 MG PO TBEC: 81.0000 mg | DELAYED_RELEASE_TABLET | Freq: Every day | ORAL | Status: DC

## 2021-04-23 MED ORDER — HEPARIN SODIUM (PORCINE) 1000 UNIT/ML IJ SOLN
INTRAMUSCULAR | Status: DC | PRN
  Administered 2021-04-23: 2500 [IU] via INTRAVENOUS
  Administered 2021-04-23: 8000 [IU] via INTRAVENOUS
  Administered 2021-04-23: 2500 [IU] via INTRAVENOUS

## 2021-04-23 MED ORDER — FAMOTIDINE IN NACL 20-0.9 MG/50ML-% IV SOLN
INTRAVENOUS | Status: AC
  Filled 2021-04-23: qty 50

## 2021-04-23 MED ORDER — SODIUM CHLORIDE 0.9 % IV SOLN: INTRAVENOUS | Status: AC

## 2021-04-23 MED ORDER — SODIUM CHLORIDE 0.9 % WEIGHT BASED INFUSION
3.0000 mL/kg/h | INTRAVENOUS | Status: DC
  Administered 2021-04-23: 3 mL/kg/h via INTRAVENOUS

## 2021-04-23 MED ORDER — NITROGLYCERIN 1 MG/10 ML FOR IR/CATH LAB
INTRA_ARTERIAL | Status: AC
  Filled 2021-04-23: qty 10

## 2021-04-23 MED ORDER — ASPIRIN 81 MG PO CHEW: 81.0000 mg | CHEWABLE_TABLET | Freq: Every day | ORAL | Status: DC

## 2021-04-23 MED ORDER — VERAPAMIL HCL 2.5 MG/ML IV SOLN
INTRAVENOUS | Status: AC
  Filled 2021-04-23: qty 2

## 2021-04-23 MED ORDER — ONDANSETRON HCL 4 MG/2ML IJ SOLN: 4.0000 mg | Freq: Four times a day (QID) | INTRAMUSCULAR | Status: DC | PRN

## 2021-04-23 MED ORDER — CLOPIDOGREL BISULFATE 75 MG PO TABS
ORAL_TABLET | ORAL | Status: AC
  Filled 2021-04-23: qty 3

## 2021-04-23 SURGICAL SUPPLY — 28 items
BALLN SAPPHIRE 2.25X15 (BALLOONS) ×2
BALLN SAPPHIRE 2.5X15 (BALLOONS) ×2
BALLN SAPPHIRE ~~LOC~~ 3.0X12 (BALLOONS) ×1 IMPLANT
BALLN SAPPHIRE ~~LOC~~ 3.0X15 (BALLOONS) ×1 IMPLANT
BALLN ~~LOC~~ EMERGE MR 3.5X12 (BALLOONS) ×2
BALLOON SAPPHIRE 2.25X15 (BALLOONS) IMPLANT
BALLOON SAPPHIRE 2.5X15 (BALLOONS) IMPLANT
BALLOON ~~LOC~~ EMERGE MR 3.5X12 (BALLOONS) IMPLANT
CATH INFINITI JR4 5F (CATHETERS) ×1 IMPLANT
CATH VISTA GUIDE 6FR JR4 (CATHETERS) ×1 IMPLANT
CATH VISTA GUIDE 6FR XBLAD3.5 (CATHETERS) ×1 IMPLANT
CATH VISTA GUIDE 6FR XBLAD4 (CATHETERS) ×1 IMPLANT
DEVICE RAD COMP TR BAND LRG (VASCULAR PRODUCTS) ×1 IMPLANT
ELECT DEFIB PAD ADLT CADENCE (PAD) ×1 IMPLANT
GLIDESHEATH SLEND A-KIT 6F 22G (SHEATH) ×1 IMPLANT
GUIDEWIRE INQWIRE 1.5J.035X260 (WIRE) IMPLANT
INQWIRE 1.5J .035X260CM (WIRE) ×2
KIT ENCORE 26 ADVANTAGE (KITS) ×1 IMPLANT
KIT HEART LEFT (KITS) ×2 IMPLANT
PACK CARDIAC CATHETERIZATION (CUSTOM PROCEDURE TRAY) ×2 IMPLANT
SHEATH PROBE COVER 6X72 (BAG) ×1 IMPLANT
STENT SYNERGY XD 2.75X24 (Permanent Stent) IMPLANT
STENT SYNERGY XD 2.75X32 (Permanent Stent) IMPLANT
SYNERGY XD 2.75X24 (Permanent Stent) ×2 IMPLANT
SYNERGY XD 2.75X32 (Permanent Stent) ×2 IMPLANT
TRANSDUCER W/STOPCOCK (MISCELLANEOUS) ×2 IMPLANT
TUBING CIL FLEX 10 FLL-RA (TUBING) ×2 IMPLANT
WIRE ASAHI PROWATER 180CM (WIRE) ×1 IMPLANT

## 2021-04-23 NOTE — Progress Notes (Signed)
TR Band removed 15:50.  Dressing applied, no hematoma.  Stayed with patient until 16:10 and stables.   Richardean Canal RN, BSN, CCRN-K

## 2021-04-23 NOTE — Interval H&P Note (Signed)
Cath Lab Visit (complete for each Cath Lab visit)  Clinical Evaluation Leading to the Procedure:   ACS: No.  Non-ACS:    Anginal Classification: CCS Kelley  Anti-ischemic medical therapy: Maximal Therapy (2 or more classes of medications)  Non-Invasive Test Results: No non-invasive testing performed  Prior CABG: No previous CABG      History and Physical Interval Note:  04/23/2021 7:18 AM  [name]  has presented today for surgery, with the diagnosis of cad.  The various methods of treatment have been discussed with the patient and family. After consideration of risks, benefits and other options for treatment, the patient has consented to  Procedure(s): CORONARY STENT INTERVENTION (N/A) as a surgical intervention.  The patient's history has been reviewed, patient examined, no change in status, stable for surgery.  I have reviewed the patient's chart and labs.  Questions were answered to the patient's satisfaction.     Jeff Kelley

## 2021-04-24 ENCOUNTER — Other Ambulatory Visit (HOSPITAL_COMMUNITY): Payer: Self-pay

## 2021-04-24 ENCOUNTER — Encounter (HOSPITAL_COMMUNITY): Payer: Self-pay | Admitting: Interventional Cardiology

## 2021-04-24 DIAGNOSIS — Z20822 Contact with and (suspected) exposure to covid-19: Secondary | ICD-10-CM | POA: Diagnosis not present

## 2021-04-24 DIAGNOSIS — I2583 Coronary atherosclerosis due to lipid rich plaque: Secondary | ICD-10-CM | POA: Diagnosis not present

## 2021-04-24 DIAGNOSIS — I2510 Atherosclerotic heart disease of native coronary artery without angina pectoris: Secondary | ICD-10-CM | POA: Diagnosis not present

## 2021-04-24 DIAGNOSIS — I25118 Atherosclerotic heart disease of native coronary artery with other forms of angina pectoris: Secondary | ICD-10-CM | POA: Diagnosis not present

## 2021-04-24 DIAGNOSIS — E119 Type 2 diabetes mellitus without complications: Secondary | ICD-10-CM | POA: Diagnosis not present

## 2021-04-24 DIAGNOSIS — E782 Mixed hyperlipidemia: Secondary | ICD-10-CM | POA: Diagnosis not present

## 2021-04-24 LAB — BASIC METABOLIC PANEL
Anion gap: 7 (ref 5–15)
BUN: 6 mg/dL (ref 6–20)
CO2: 26 mmol/L (ref 22–32)
Calcium: 9 mg/dL (ref 8.9–10.3)
Chloride: 100 mmol/L (ref 98–111)
Creatinine, Ser: 0.81 mg/dL (ref 0.61–1.24)
GFR, Estimated: >60 mL/min (ref >60)
Glucose, Bld: 97 mg/dL (ref 70–99)
Potassium: 3.7 mmol/L (ref 3.5–5.1)
Sodium: 133 mmol/L — ABNORMAL LOW (ref 135–145)

## 2021-04-24 LAB — CBC
HCT: 38.9 % — ABNORMAL LOW (ref 39.0–52.0)
Hemoglobin: 13.4 g/dL (ref 13.0–17.0)
MCH: 31.2 pg (ref 26.0–34.0)
MCHC: 34.4 g/dL (ref 30.0–36.0)
MCV: 90.5 fL (ref 80.0–100.0)
Platelets: 282 10*3/uL (ref 150–400)
RBC: 4.3 MIL/uL (ref 4.22–5.81)
RDW: 12.4 % (ref 11.5–15.5)
WBC: 10.2 10*3/uL (ref 4.0–10.5)
nRBC: 0 % (ref 0.0–0.2)

## 2021-04-24 MED ORDER — EMPAGLIFLOZIN 10 MG PO TABS
10.0000 mg | ORAL_TABLET | Freq: Every day | ORAL | 11 refills | Status: DC
  Filled 2021-04-24: qty 30, 30d supply, fill #0

## 2021-04-24 MED ORDER — METFORMIN HCL 500 MG PO TABS: 500.0000 mg | ORAL_TABLET | Freq: Two times a day (BID) | ORAL | 0 refills | Status: DC

## 2021-04-24 MED ORDER — EZETIMIBE 10 MG PO TABS
10.0000 mg | ORAL_TABLET | Freq: Every day | ORAL | 11 refills | Status: DC
  Filled 2021-04-24: qty 30, 30d supply, fill #0

## 2021-04-24 MED FILL — Heparin Sod (Porcine)-NaCl IV Soln 1000 Unit/500ML-0.9%: INTRAVENOUS | Qty: 500 | Status: AC

## 2021-04-24 NOTE — Discharge Summary (Addendum)
Discharge Summary    Patient ID: Jeff Kelley MRN: [id number]; DOB: Jun 25, 1971  Admit date: 04/23/2021 Discharge date: 04/24/2021  PCP:  Camillia Herter, NP   [facility] Providers Cardiologist:  Evalina Field, MD    Discharge Diagnoses    Principal Problem:   Coronary artery disease Active Problems:   Elevated troponin   CHF (congestive heart failure) ([place])   DM II (diabetes mellitus, type II), controlled ([place])   Essential hypertension   Hyperlipidemia LDL goal <70   CAD (coronary artery disease), native coronary artery  Diagnostic Studies/Procedures    Cath: 04/23/21  CONCLUSIONS: Successful mid LAD PCI Reducing segmental 80% stenosis to 0% with TIMI grade III flow using a 2.75 x 24 Synergy postdilated to 3.0 mm in diameter with maintenance of TIMI grade III flow. Successful PCI distal RCA reducing 95% stenosis followed by 70% stenosis to 0% with a 32 x 2.75 mm Synergy postdilated to 3.5 mm in diameter.  TIMI grade III flow maintained.   RECOMMENDATIONS: Continue aspirin and Plavix for 1 year then monotherapy with Plavix thereafter. Aggressive risk factor modification including potentially starting PCSK9 therapy while hospitalized.  Diagnostic Dominance: Right Intervention   _____________   History of Present Illness     Jeff Kelley is a 50 y.o. male with PMH of CAD (LAD, RCA and RI disease), DM, Colon CA s/p partial colectomy, seizure disorder who initially presented to [facility] in March 2022 and found to have colon cancer.  He was treated with partial colectomy.  Also noted to be anemic during that admission.  Did have an echo which showed wall motion abnormality in the LAD distribution and troponin was elevated.  Underwent coronary CTA which was positive for two-vessel disease.  He was brought back and underwent left heart catheterization in April 2022 noting disease in the LAD and RCA.  He was referred to CT surgery.  He was seen by Dr. Roxan Hockey and  turned down for CABG in the setting of deconditioning.  He was referred to Dr. Tamala Julian for consideration of PCI.  He was seen in the office on 10/5 and procedure discussed.  He was loaded on Plavix and switched to Protonix for his GERD.  Hospital Course     Underwent cardiac catheterization on 10/11 with Dr. Tamala Julian with successful mid LAD PCI of 80% stenosis with DES x1, along with successful distal RCA PCI of 95% stenosis with DES x1.  Plan for DAPT with aspirin/Plavix for 1 year with Plavix monotherapy afterwards.  No issues noted post cath.  Seen by cardiac rehab.  It is noted he has had issues with multiple statins in the past including myalgias.  Plan will be outpatient referral to lipid clinic for PCSK9 therapy (sent at discharge).  He is agreeable to start Zetia in the interim.  Also discussed with patient regarding starting SGLT2 which he is agreeable to as well. Other home medications were continued without change.  General: Well developed, well nourished, male appearing in no acute distress. Head: Normocephalic, atraumatic.  Neck: Supple without bruits, JVD. Lungs:  Resp regular and unlabored, CTA. Heart: RRR, S1, S2, no S3, S4, or murmur; no rub. Abdomen: Soft, non-tender, non-distended with normoactive bowel sounds. No hepatomegaly. No rebound/guarding. No obvious abdominal masses. Extremities: No clubbing, cyanosis, edema. Distal pedal pulses are 2+ bilaterally. Right radial cath site stable without bruising or hematoma Neuro: Alert and oriented X 3. Moves all extremities spontaneously. Psych: Normal affect.  Patient was seen by Dr. Angelena Form  and deemed stable for discharge.  Follow-up with been arranged in the office.  Medications sent to [place].  Educated by [employer].D. prior to discharge.  Did the patient have an acute coronary syndrome (MI, NSTEMI, STEMI, etc) this admission?:  No                               Did the patient have a percutaneous coronary intervention (stent /  angioplasty)?:  Yes.     Cath/PCI Registry Performance & Quality Measures: Aspirin prescribed? - Yes ADP Receptor Inhibitor (Plavix/Clopidogrel, Brilinta/Ticagrelor or Effient/Prasugrel) prescribed (includes medically managed patients)? - Yes High Intensity Statin (Lipitor 40-80mg  or Crestor 20-40mg ) prescribed? - No - intolerant For EF <40%, was ACEI/ARB prescribed? - Not Applicable (EF >/= 34%) For EF <40%, Aldosterone Antagonist (Spironolactone or Eplerenone) prescribed? - Not Applicable (EF >/= 28%) Cardiac Rehab Phase II ordered? - Yes      _____________  Discharge Vitals Blood pressure 104/73, pulse 74, temperature 98.6 F (37 C), temperature source Oral, resp. rate 18, height 5\' 7"  (1.702 m), weight 85.2 kg, SpO2 97 %.  Filed Weights   04/23/21 0552 04/23/21 1349  Weight: 87.1 kg 85.2 kg    Labs & Radiologic Studies    CBC Recent Labs    04/23/21 1436 04/24/21 0223  WBC 7.9 10.2  HGB 14.8 13.4  HCT 42.6 38.9*  MCV 90.4 90.5  PLT 309 768   Basic Metabolic Panel Recent Labs    04/23/21 1436 04/24/21 0223  NA  --  133*  K  --  3.7  CL  --  100  CO2  --  26  GLUCOSE  --  97  BUN  --  6  CREATININE 0.79 0.81  CALCIUM  --  9.0   Liver Function Tests No results for input(s): AST, ALT, ALKPHOS, BILITOT, PROT, ALBUMIN in the last 72 hours. No results for input(s): LIPASE, AMYLASE in the last 72 hours. High Sensitivity Troponin:   No results for input(s): TROPONINIHS in the last 720 hours.  BNP Invalid input(s): POCBNP D-Dimer No results for input(s): DDIMER in the last 72 hours. Hemoglobin A1C No results for input(s): HGBA1C in the last 72 hours. Fasting Lipid Panel No results for input(s): CHOL, HDL, LDLCALC, TRIG, CHOLHDL, LDLDIRECT in the last 72 hours. Thyroid Function Tests No results for input(s): TSH, T4TOTAL, T3FREE, THYROIDAB in the last 72 hours.  Invalid input(s): FREET3 _____________  CARDIAC CATHETERIZATION  Result Date:  04/24/2021 CONCLUSIONS: Successful mid LAD PCI Reducing segmental 80% stenosis to 0% with TIMI grade III flow using a 2.75 x 24 Synergy postdilated to 3.0 mm in diameter with maintenance of TIMI grade III flow. Successful PCI distal RCA reducing 95% stenosis followed by 70% stenosis to 0% with a 32 x 2.75 mm Synergy postdilated to 3.5 mm in diameter.  TIMI grade III flow maintained. RECOMMENDATIONS: Continue aspirin and Plavix for 1 year then monotherapy with Plavix thereafter. Aggressive risk factor modification including potentially starting PCSK9 therapy while hospitalized.   Disposition   Pt is being discharged home today in good condition.  Follow-up Plans & Appointments     Follow-up Information     Loel Dubonnet, NP Follow up on 05/10/2021.   Specialty: Cardiology Why: at 8:30am for your follow up appt with Dr. Debbe Mounts NP Donnetta Hail information: [place] [address] [phone]  Discharge Instructions     AMB Referral to [facility]   Complete by: As directed    Reason For Referral: Lipids   Call MD for:  difficulty breathing, headache or visual disturbances   Complete by: As directed    Call MD for:  persistant dizziness or light-headedness   Complete by: As directed    Call MD for:  redness, tenderness, or signs of infection (pain, swelling, redness, odor or green/yellow discharge around incision site)   Complete by: As directed    Diet - low sodium heart healthy   Complete by: As directed    Discharge instructions   Complete by: As directed    Radial Site Care Refer to this sheet in the next few weeks. These instructions provide you with information on caring for yourself after your procedure. Your caregiver may also give you more specific instructions. Your treatment has been planned according to current medical practices, but problems sometimes occur. Call your caregiver if you have any problems or questions  after your procedure. HOME CARE INSTRUCTIONS You may shower the day after the procedure. Remove the bandage (dressing) and gently wash the site with plain soap and water. Gently pat the site dry.  Do not apply powder or lotion to the site.  Do not submerge the affected site in water for 3 to 5 days.  Inspect the site at least twice daily.  Do not flex or bend the affected arm for 24 hours.  No lifting over 5 pounds (2.3 kg) for 5 days after your procedure.  Do not drive home if you are discharged the same day of the procedure. Have someone else drive you.  You may drive 24 hours after the procedure unless otherwise instructed by your caregiver.  What to expect: Any bruising will usually fade within 1 to 2 weeks.  Blood that collects in the tissue (hematoma) may be painful to the touch. It should usually decrease in size and tenderness within 1 to 2 weeks.  SEEK IMMEDIATE MEDICAL CARE IF: You have unusual pain at the radial site.  You have redness, warmth, swelling, or pain at the radial site.  You have drainage (other than a small amount of blood on the dressing).  You have chills.  You have a fever or persistent symptoms for more than 72 hours.  You have a fever and your symptoms suddenly get worse.  Your arm becomes pale, cool, tingly, or numb.  You have heavy bleeding from the site. Hold pressure on the site.   PLEASE DO NOT MISS ANY DOSES OF YOUR PLAVIX!!!!! Also keep a log of you blood pressures and bring back to your follow up appt. Please call the office with any questions.   Patients taking blood thinners should generally stay away from medicines like ibuprofen, Advil, Motrin, naproxen, and Aleve due to risk of stomach bleeding. You may take Tylenol as directed or talk to your primary doctor about alternatives.  Some studies suggest Prilosec/Omeprazole interacts with Plavix. We changed your Prilosec/Omeprazole to the equivalent dose of Protonix for less chance of  interaction.  PLEASE ENSURE THAT YOU DO NOT RUN OUT OF YOUR PLAVIX. This medication is very important to remain on for at least one year. IF you have issues obtaining this medication due to cost please CALL the office 3-5 business days prior to running out in order to prevent missing doses of this medication.   Increase activity slowly   Complete by: As directed  Discharge Medications   Allergies as of 04/24/2021       Reactions   Atorvastatin    Diarrhea, muscle pain   Codeine Other (See Comments)   Patient states he was told he was allergic when he was [age] years old, unknown reaction.   Penicillins Other (See Comments)   Patient states he was told he was allergic when he was [age] years ago, unknown reaction        Medication List     TAKE these medications    acetaminophen 500 MG tablet Commonly known as: TYLENOL Take 1,000 mg by mouth every 6 (six) hours as needed for headache (pain).   aspirin 81 MG EC tablet Take 1 tablet (81 mg total) by mouth daily. Swallow whole.   clopidogrel 75 MG tablet Commonly known as: PLAVIX Take 1 tablet (75 mg total) by mouth daily.   empagliflozin 10 MG Tabs tablet Commonly known as: Jardiance Take 1 tablet (10 mg total) by mouth daily before breakfast.   ezetimibe 10 MG tablet Commonly known as: Zetia Take 1 tablet (10 mg total) by mouth daily.   ferrous sulfate 325 (65 FE) MG tablet Take 1 tablet (325 mg total) by mouth daily with breakfast.   lamoTRIgine 200 MG tablet Commonly known as: LAMICTAL Take 400 mg by mouth 2 (two) times daily.   levETIRAcetam 750 MG tablet Commonly known as: KEPPRA Take 1,500 mg by mouth 2 (two) times daily.   LORazepam 1 MG tablet Commonly known as: ATIVAN Take 1 mg by mouth 2 (two) times daily.   metFORMIN 500 MG tablet Commonly known as: Glucophage Take 1 tablet (500 mg total) by mouth 2 (two) times daily with a meal. Start taking on: April 25, 2021   metoprolol  tartrate 25 MG tablet Commonly known as: LOPRESSOR TAKE 1 TABLET BY MOUTH TWICE A DAY   multivitamin with minerals Tabs tablet Take 1 tablet by mouth daily.   nitroGLYCERIN 0.4 MG SL tablet Commonly known as: NITROSTAT Place 1 tablet (0.4 mg total) under the tongue every 5 (five) minutes as needed for chest pain.   pantoprazole 40 MG tablet Commonly known as: PROTONIX Take 1 tablet (40 mg total) by mouth daily.   PHENobarbital 64.8 MG tablet Commonly known as: LUMINAL Take 64.8 mg by mouth 2 (two) times daily.   TRUEplus Lancets 28G Misc Use as directed   Vitamin D3 125 MCG (5000 UT) Caps Take 5,000 Units by mouth daily.         Outstanding Labs/Studies   Outpatient lipid clinic referral   Duration of Discharge Encounter   Greater than 30 minutes including physician time.  Signed, Reino Bellis, NP 04/24/2021, 10:07 AM  I have personally seen and examined this patient. I agree with the assessment and plan as outlined above.  Pt doing well post PCI. Will d/c today on ASA, Plavix.   Lauree Chandler 04/24/2021 10:10 AM

## 2021-04-24 NOTE — TOC Benefit Eligibility Note (Signed)
Patient Teacher, English as a foreign language completed.    The patient is currently admitted and upon discharge could be taking Farxiga 10 mg.  The current 30 day co-pay is, $9.85.   The patient is currently admitted and upon discharge could be taking Jardiance 10 mg.  The current 30 day co-pay is, $9.85.   The patient is insured through [place], [employer] Patient Advocate Specialist [place] Team Direct Number: [phone]  Fax: [phone]

## 2021-04-24 NOTE — Progress Notes (Signed)
CARDIAC REHAB PHASE I   Offered to walk with pt. Pt declines at this time. Stent education completed. Pt educated on importance of ASA and Plavix. Pt given heart healthy and diabetic diets. Reviewed site care, restrictions, and exercise guidelines. Will refer to CRP II [place] with knowledge pt does not have transportation.   [id number] Rufina Falco, RN BSN 04/24/2021 11:04 AM

## 2021-04-30 ENCOUNTER — Encounter: Payer: Self-pay | Admitting: *Deleted

## 2021-04-30 NOTE — Progress Notes (Signed)
Guardant Reveal + at June collection. Patient missed his OV last week. Needs f/u with Dr. Benay Spice 1st available. HP scheduling message sent.

## 2021-05-01 ENCOUNTER — Encounter: Payer: Self-pay | Admitting: *Deleted

## 2021-05-01 NOTE — Progress Notes (Signed)
Patient informed scheduler that he has too many cardiology issues and appointments and can't afford to see Dr. Benay Spice right now. Notified MD>

## 2021-05-02 ENCOUNTER — Telehealth (HOSPITAL_COMMUNITY): Payer: Self-pay

## 2021-05-02 ENCOUNTER — Ambulatory Visit: Payer: Medicare HMO

## 2021-05-02 NOTE — Telephone Encounter (Signed)
Pt insurance is active and benefits verified through [place] $10, DED 0/0 met, out of pocket $3,900/$2,921.61 met, co-insurance 0%. no pre-authorization required. Passport, 05/02/2021'@2' :18opm, REF# [id number]   Will contact patient to see if he is interested in the Cardiac Rehab Program. If interested, patient will need to complete follow up appt. Once completed, patient will be contacted for scheduling upon review by the RN Navigator.

## 2021-05-02 NOTE — Telephone Encounter (Signed)
Attempted to call patient in regards to Cardiac Rehab - LM on VM 

## 2021-05-06 ENCOUNTER — Telehealth: Payer: Self-pay | Admitting: *Deleted

## 2021-05-06 ENCOUNTER — Ambulatory Visit: Payer: Medicare HMO | Admitting: Oncology

## 2021-05-07 ENCOUNTER — Other Ambulatory Visit (HOSPITAL_COMMUNITY): Payer: Self-pay

## 2021-05-08 ENCOUNTER — Encounter: Payer: Self-pay | Admitting: *Deleted

## 2021-05-10 ENCOUNTER — Ambulatory Visit (INDEPENDENT_AMBULATORY_CARE_PROVIDER_SITE_OTHER): Payer: Medicare HMO | Admitting: Family

## 2021-05-10 ENCOUNTER — Encounter (HOSPITAL_BASED_OUTPATIENT_CLINIC_OR_DEPARTMENT_OTHER): Payer: Self-pay | Admitting: Family

## 2021-05-10 ENCOUNTER — Other Ambulatory Visit: Payer: Self-pay

## 2021-05-10 VITALS — BP 120/80 | HR 53 | Ht 66.0 in | Wt 196.0 lb

## 2021-05-10 DIAGNOSIS — E785 Hyperlipidemia, unspecified: Secondary | ICD-10-CM | POA: Diagnosis not present

## 2021-05-10 DIAGNOSIS — K219 Gastro-esophageal reflux disease without esophagitis: Secondary | ICD-10-CM

## 2021-05-10 DIAGNOSIS — I25118 Atherosclerotic heart disease of native coronary artery with other forms of angina pectoris: Secondary | ICD-10-CM | POA: Diagnosis not present

## 2021-05-10 DIAGNOSIS — E118 Type 2 diabetes mellitus with unspecified complications: Secondary | ICD-10-CM | POA: Diagnosis not present

## 2021-05-10 MED ORDER — EMPAGLIFLOZIN 10 MG PO TABS: 10.0000 mg | ORAL_TABLET | Freq: Every day | ORAL | 3 refills | Status: DC

## 2021-05-10 MED ORDER — EZETIMIBE 10 MG PO TABS: 10.0000 mg | ORAL_TABLET | Freq: Every day | ORAL | 3 refills | Status: DC

## 2021-05-10 NOTE — Progress Notes (Signed)
Office Visit    Patient Name: Jeff Kelley Date of Encounter: 05/10/2021  PCP:  Camillia Herter, NP   [place]  Cardiologist:  Evalina Field, MD  Advanced Practice Provider:  No care team member to display Electrophysiologist:  None   Chief Complaint    Jeff Kelley is a 50 y.o. male with a hx of CAD (LAD, RCA, R1 disease) s/p DES to RCA and LAD 04/2021, DM2, colon cancer s/p partial colectomy, seizure disorder presents today for follow-up after coronary stenting  Past Medical History    Past Medical History:  Diagnosis Date   Colon cancer ([place])    Coronary artery disease    Diabetes mellitus without complication ([place])    Hyperlipidemia    Hypertension    Seizures ([place])    Past Surgical History:  Procedure Laterality Date   BIOPSY  09/24/2020   Procedure: BIOPSY;  Surgeon: Juanita Craver, MD;  Location: [facility] ENDOSCOPY;  Service: Endoscopy;;   COLONOSCOPY WITH PROPOFOL N/A 09/24/2020   Procedure: COLONOSCOPY WITH PROPOFOL;  Surgeon: Juanita Craver, MD;  Location: [facility] ENDOSCOPY;  Service: Endoscopy;  Laterality: N/A;   CORONARY STENT INTERVENTION N/A 04/23/2021   Procedure: CORONARY STENT INTERVENTION;  Surgeon: Belva Crome, MD;  Location: [place] CV LAB;  Service: Cardiovascular;  Laterality: N/A;   ESOPHAGEAL BRUSHING  09/24/2020   Procedure: ESOPHAGEAL BRUSHING;  Surgeon: Juanita Craver, MD;  Location: [facility] ENDOSCOPY;  Service: Endoscopy;;   ESOPHAGOGASTRODUODENOSCOPY (EGD) WITH PROPOFOL N/A 09/24/2020   Procedure: ESOPHAGOGASTRODUODENOSCOPY (EGD) WITH PROPOFOL;  Surgeon: Juanita Craver, MD;  Location: [facility] ENDOSCOPY;  Service: Endoscopy;  Laterality: N/A;   LAPAROSCOPIC PARTIAL COLECTOMY N/A 09/27/2020   Procedure: LAPAROSCOPIC ASSISTED  PARTIAL COLECTOMY;  Surgeon: Jovita Kussmaul, MD;  Location: [place];  Service: General;  Laterality: N/A;   LEFT HEART CATH AND CORONARY ANGIOGRAPHY N/A 11/09/2020   Procedure: LEFT HEART CATH AND CORONARY  ANGIOGRAPHY;  Surgeon: Belva Crome, MD;  Location: [place] CV LAB;  Service: Cardiovascular;  Laterality: N/A;   POLYPECTOMY  09/24/2020   Procedure: POLYPECTOMY;  Surgeon: Juanita Craver, MD;  Location: [facility] ENDOSCOPY;  Service: Endoscopy;;    Allergies  Allergies  Allergen Reactions   Atorvastatin     Diarrhea, muscle pain   Codeine Other (See Comments)    Patient states he was told he was allergic when he was [age] years old, unknown reaction.   Penicillins Other (See Comments)    Patient states he was told he was allergic when he was [age] years ago, unknown reaction    History of Present Illness    RHETT Kelley is a 50 y.o. male with a hx of CAD (LAD, RCA, R1 disease) s/p DES to RCA and LAD 04/2021, DM2, colon cancer s/p partial colectomy, seizure disorder  last seen follow-up after coronary stenting.  Presented March 2022 and was found to have colon cancer.  He was treated with partial colectomy.  Also noted to be anemic during that admission.  Echocardiogram with wall motion abnormality in LAD distribution and troponin elevated.  Coronary CTA positive for two-vessel disease.  Underwent left heart catheterization April 2022 with disease in LAD and RCA.  He was referred to cardiothoracic surgery and evaluated by Dr. Roxan Hockey and turned down for CABG in the setting of deconditioning.  He was referred to Dr. Tamala Julian for consideration of PCI, loaded with Plavix, switch to Protonix for GERD and 04/23/2021 underwent successful mid LAD PCI of  80% stenosis and DES X1 along with successful distal RCA PCI of 95% stenosis with DES X1.  Plans for DAPT aspirin/Plavix for 1 year and Plavix monotherapy afterward.  He had multiple issues with statin started on Zetia as well as referred to lipid clinic.  SGLT2 Jardiance was started.  Presents today for follow-up.  Right radial cath site with mild soreness but no significant ecchymosis nor hematoma.  Reports no chest pain, pressure, tightness.   Reports dyspnea with more than usual activity which is unchanged from previous.  No edema, orthopnea, PND.  We reviewed his medication changes in detail and he was appreciative of the explanation.  EKGs/Labs/Other Studies Reviewed:   The following studies were reviewed today:    Cath: 04/23/21   CONCLUSIONS: Successful mid LAD PCI Reducing segmental 80% stenosis to 0% with TIMI grade III flow using a 2.75 x 24 Synergy postdilated to 3.0 mm in diameter with maintenance of TIMI grade III flow. Successful PCI distal RCA reducing 95% stenosis followed by 70% stenosis to 0% with a 32 x 2.75 mm Synergy postdilated to 3.5 mm in diameter.  TIMI grade III flow maintained.   RECOMMENDATIONS: Continue aspirin and Plavix for 1 year then monotherapy with Plavix thereafter. Aggressive risk factor modification including potentially starting PCSK9 therapy while hospitalized.   Diagnostic Dominance: Right Intervention     EKG:  EKG is  ordered today.  The ekg ordered today demonstrates SB 53 bpm with minimal criteria for LVH and TWI lead III.   Recent Labs: 09/21/2020: B Natriuretic Peptide 297.0 10/05/2020: Magnesium 1.8 01/18/2021: ALT 21 02/08/2021: TSH 3.490 04/24/2021: BUN 6; Creatinine, Ser 0.81; Hemoglobin 13.4; Platelets 282; Potassium 3.7; Sodium 133  Recent Lipid Panel    Component Value Date/Time   CHOL 258 (H) 02/08/2021 1150   TRIG 187 (H) 02/08/2021 1150   HDL 53 02/08/2021 1150   CHOLHDL 4.9 02/08/2021 1150   CHOLHDL 5.7 09/24/2020 0323   VLDL 16 09/24/2020 0323   LDLCALC 171 (H) 02/08/2021 1150     Home Medications   Current Meds  Medication Sig   acetaminophen (TYLENOL) 500 MG tablet Take 1,000 mg by mouth every 6 (six) hours as needed for headache (pain).   aspirin 81 MG EC tablet Take 1 tablet (81 mg total) by mouth daily. Swallow whole.   Cholecalciferol (VITAMIN D3) 125 MCG (5000 UT) CAPS Take 5,000 Units by mouth daily.   clopidogrel (PLAVIX) 75 MG tablet Take 1  tablet (75 mg total) by mouth daily.   ferrous sulfate 325 (65 FE) MG tablet Take 1 tablet (325 mg total) by mouth daily with breakfast.   lamoTRIgine (LAMICTAL) 200 MG tablet Take 400 mg by mouth 2 (two) times daily.   levETIRAcetam (KEPPRA) 750 MG tablet Take 1,500 mg by mouth 2 (two) times daily.   LORazepam (ATIVAN) 1 MG tablet Take 1 mg by mouth 2 (two) times daily.   metoprolol tartrate (LOPRESSOR) 25 MG tablet TAKE 1 TABLET BY MOUTH TWICE A DAY   Multiple Vitamin (MULTIVITAMIN WITH MINERALS) TABS tablet Take 1 tablet by mouth daily.   pantoprazole (PROTONIX) 40 MG tablet Take 1 tablet (40 mg total) by mouth daily.   PHENobarbital (LUMINAL) 64.8 MG tablet Take 64.8 mg by mouth 2 (two) times daily.   TRUEplus Lancets 28G MISC Use as directed   [DISCONTINUED] empagliflozin (JARDIANCE) 10 MG TABS tablet Take 1 tablet (10 mg total) by mouth daily before breakfast.   [DISCONTINUED] ezetimibe (ZETIA) 10 MG tablet Take 1  tablet (10 mg total) by mouth daily.     Review of Systems   All other systems reviewed and are otherwise negative except as noted above.  Physical Exam    VS:  BP 120/80   Pulse (!) 53   Ht 5\' 6"  (1.676 m)   Wt 196 lb (88.9 kg)   SpO2 97%   BMI 31.64 kg/m  , BMI Body mass index is 31.64 kg/m.  Wt Readings from Last 3 Encounters:  05/10/21 196 lb (88.9 kg)  04/23/21 187 lb 14.4 oz (85.2 kg)  04/17/21 192 lb (87.1 kg)    GEN: Well nourished, well developed, in no acute distress. HEENT: normal. Neck: Supple, no JVD, carotid bruits, or masses. Cardiac: RRR, no murmurs, rubs, or gallops. No clubbing, cyanosis, edema.  Radials/PT 2+ and equal bilaterally.  Respiratory:  Respirations regular and unlabored, clear to auscultation bilaterally. GI: Soft, nontender, nondistended. MS: No deformity or atrophy. Skin: Warm and dry, no rash.  Right radial cath site with no hematoma nor infection. Neuro:  Strength and sensation are intact. Psych: Normal affect.  Assessment  & Plan    CAD - s/p DES to RCA and LAD 04/2021. Plan for DAPT x 12 months then Plavix monotherapy. GDMT includes aspirin, plavix, Jardiance, Metoprolol, Zetia. Statin intolerance  - lipid management detailed below. Heart healthy diet and regular cardiovascular exercise encouraged.  BMP today for monitoring.   HLD, LDL goal <70 / Statin intolerance - 09/24/20 LDl 166. Intolerant to multiple statins including including Atorvastatin and Rosuvastatin. Started Zetia during recent admit. Tolerating well. Will refer to lipid clinic.  Colon cancer - s/p partial colectomy.   DM2 - Continue to follow with PCP.   Carotid artery disease - Duplex 12/14/20 with right ICA 100% occluded. Left ICA 1-39% stenosed. Follows with Dr. Donzetta Matters of [facility]. Given asymptomatic, no intervention planned.   GERD - Continue Protonix 20mg  QD  Seizure disorder - Continue Keppra and follow up with neurology  Disposition: Follow up in March as scheduled with Dr. Audie Box  Signed, Loel Dubonnet, NP 05/10/2021, 10:01 AM [place]

## 2021-05-10 NOTE — Patient Instructions (Signed)
Medication Instructions:  Continue your current medications.   *If you need a refill on your cardiac medications before your next appointment, please call your pharmacy*   Lab Work: Your physician recommends that you return for lab work today: BMP  If you have labs (blood work) drawn today and your tests are completely normal, you will receive your results only by: [place] (if you have MyChart) OR A paper copy in the mail If you have any lab test that is abnormal or we need to change your treatment, we will call you to review the results.   Testing/Procedures: Your EKG today showed sinus bradycardia which is a stable finding. If you have new lightheadedness or dizziness please call and let us know.    Follow-Up: At [facility], you and your health needs are our priority.  As part of our continuing mission to provide you with exceptional heart care, we have created designated Provider Care Teams.  These Care Teams include your primary Cardiologist (physician) and Advanced Practice Providers (APPs -  Physician Assistants and Nurse Practitioners) who all work together to provide you with the care you need, when you need it.  We recommend signing up for the patient portal called "MyChart".  Sign up information is provided on this After Visit Summary.  MyChart is used to connect with patients for Virtual Visits (Telemedicine).  Patients are able to view lab/test results, encounter notes, upcoming appointments, etc.  Non-urgent messages can be sent to your provider as well.   To learn more about what you can do with MyChart, go to [email or web address].    Your next appointment:   As scheduled with Dr. Audie Box   Other Instructions  Heart Healthy Diet Recommendations: A low-salt diet is recommended. Meats should be grilled, baked, or boiled. Avoid fried foods. Focus on lean protein sources like fish or chicken with vegetables and fruits. The American Heart Association is a Educational psychologist!  American Heart Association Diet and Lifeystyle Recommendations    Exercise recommendations: The American Heart Association recommends 150 minutes of moderate intensity exercise weekly. Try 30 minutes of moderate intensity exercise 4-5 times per week. This could include walking, jogging, or swimming.  For coronary artery disease often called "heart disease" we aim for optimal guideline directed medical therapy. We use the "A, B, C"s to help keep [place] on track!  A = Aspirin 81mg  daily B = Beta blocker which helps to relax the heart. This is your Metoprolol. C = Cholesterol control. You take Zetia to help control your cholesterol.  D = Don't forget nitroglycerin! This is an emergency tablet to be used if you have chest pain. E = Extras. In your case, this is Plavix for at least 1 year to protect your stent

## 2021-05-11 LAB — BASIC METABOLIC PANEL
BUN/Creatinine Ratio: 11 (ref 9–20)
BUN: 9 mg/dL (ref 6–24)
CO2: 22 mmol/L (ref 20–29)
Calcium: 9.7 mg/dL (ref 8.7–10.2)
Chloride: 101 mmol/L (ref 96–106)
Creatinine, Ser: 0.79 mg/dL (ref 0.76–1.27)
Glucose: 84 mg/dL (ref 70–99)
Potassium: 4.5 mmol/L (ref 3.5–5.2)
Sodium: 139 mmol/L (ref 134–144)
eGFR: 108 mL/min/{1.73_m2} (ref >59)

## 2021-05-24 ENCOUNTER — Ambulatory Visit: Payer: Medicare HMO | Admitting: Oncology

## 2021-05-24 ENCOUNTER — Other Ambulatory Visit: Payer: Medicare HMO

## 2021-07-12 ENCOUNTER — Ambulatory Visit: Payer: Medicare HMO | Admitting: Cardiovascular Disease

## 2021-07-12 ENCOUNTER — Encounter (HOSPITAL_BASED_OUTPATIENT_CLINIC_OR_DEPARTMENT_OTHER): Payer: Self-pay

## 2021-07-17 NOTE — Telephone Encounter (Signed)
Please advise 

## 2021-08-25 ENCOUNTER — Encounter: Payer: Self-pay | Admitting: Cardiovascular Disease

## 2021-08-26 MED ORDER — METOPROLOL TARTRATE 25 MG PO TABS: 25.0000 mg | ORAL_TABLET | Freq: Two times a day (BID) | ORAL | 0 refills | Status: DC

## 2021-08-26 NOTE — Addendum Note (Signed)
Addended by: Beatrix Fetters on: 08/26/2021 05:11 PM   Modules accepted: Orders

## 2021-08-27 MED ORDER — METOPROLOL TARTRATE 25 MG PO TABS: 25.0000 mg | ORAL_TABLET | Freq: Two times a day (BID) | ORAL | 0 refills | Status: DC

## 2021-08-27 NOTE — Addendum Note (Signed)
Addended by: Waylan Rocher on: 08/27/2021 07:42 AM   Modules accepted: Orders

## 2021-10-03 NOTE — Progress Notes (Signed)
?Cardiology Office Note:   ?Date:  10/04/2021  ?NAME:  Jeff Kelley    ?MRN: [id number] ?DOB:  [birth year]  ? ?PCP:  Camillia Herter, NP  ?Cardiologist:  Evalina Field, MD  ?Electrophysiologist:  None  ? ?Referring MD: Camillia Herter, NP  ? ?Chief Complaint  ?Patient presents with  ? Follow-up  ?   ?  ? ?History of Present Illness:   ?Jeff Kelley is a 51 y.o. male with a hx of CAD s/p PCI, HTN, HLD, DM who presents for follow-up.  He reports no chest pain or trouble breathing.  Did not see pharmacy for PCSK9 inhibitor therapy.  I did stress the importance of this.  He has been intolerant of statins.  Blood pressure slightly elevated.  He thinks he is taking all of his medicines correctly.  No bleeding on aspirin and Plavix.  He has had some weight gain.  Needs to work on this.  Denies any chest pain or trouble breathing.  No activity.  Denies any symptoms in office. ? ?Problem List ?1. CAD ?-85% mid LAD -> PCI 04/2021 ?-80% D1 ?-80% distal RCA -> PCI 04/2021 ?-EF 55-60% with distal apical anterior WMA ?2. DM ?-A1c 5.3 ?3. Colon Adenocarcinoma  ?-Stage II -> treated with partial colectomy 09/2020 ?4. Ectatic Ascending Aorta ?-42 mm 01/18/2021 ?5. HLD ?-T chol 258, HDL 53, LDL 171, TG 187 ?-statin intolerant ?6. Seizure Disorder ?7. Carotid artery disease  ?-R ICA 100% ?-L ICA 1-39% ? ?Past Medical History: ?Past Medical History:  ?Diagnosis Date  ? Colon cancer ([place])   ? Coronary artery disease   ? Diabetes mellitus without complication ([place])   ? Hyperlipidemia   ? Hypertension   ? Seizures ([place])   ? ? ?Past Surgical History: ?Past Surgical History:  ?Procedure Laterality Date  ? BIOPSY  09/24/2020  ? Procedure: BIOPSY;  Surgeon: Juanita Craver, MD;  Location: [facility] ENDOSCOPY;  Service: Endoscopy;;  ? COLONOSCOPY WITH PROPOFOL N/A 09/24/2020  ? Procedure: COLONOSCOPY WITH PROPOFOL;  Surgeon: Juanita Craver, MD;  Location: [facility] ENDOSCOPY;  Service: Endoscopy;  Laterality: N/A;  ? CORONARY STENT INTERVENTION N/A 04/23/2021  ?  Procedure: CORONARY STENT INTERVENTION;  Surgeon: Belva Crome, MD;  Location: [place] CV LAB;  Service: Cardiovascular;  Laterality: N/A;  ? ESOPHAGEAL BRUSHING  09/24/2020  ? Procedure: ESOPHAGEAL BRUSHING;  Surgeon: Juanita Craver, MD;  Location: [facility] ENDOSCOPY;  Service: Endoscopy;;  ? ESOPHAGOGASTRODUODENOSCOPY (EGD) WITH PROPOFOL N/A 09/24/2020  ? Procedure: ESOPHAGOGASTRODUODENOSCOPY (EGD) WITH PROPOFOL;  Surgeon: Juanita Craver, MD;  Location: [facility] ENDOSCOPY;  Service: Endoscopy;  Laterality: N/A;  ? LAPAROSCOPIC PARTIAL COLECTOMY N/A 09/27/2020  ? Procedure: LAPAROSCOPIC ASSISTED  PARTIAL COLECTOMY;  Surgeon: Jovita Kussmaul, MD;  Location: [place];  Service: General;  Laterality: N/A;  ? LEFT HEART CATH AND CORONARY ANGIOGRAPHY N/A 11/09/2020  ? Procedure: LEFT HEART CATH AND CORONARY ANGIOGRAPHY;  Surgeon: Belva Crome, MD;  Location: [place] CV LAB;  Service: Cardiovascular;  Laterality: N/A;  ? POLYPECTOMY  09/24/2020  ? Procedure: POLYPECTOMY;  Surgeon: Juanita Craver, MD;  Location: [facility] ENDOSCOPY;  Service: Endoscopy;;  ? ? ?Current Medications: ?Current Meds  ?Medication Sig  ? acetaminophen (TYLENOL) 500 MG tablet Take 1,000 mg by mouth every 6 (six) hours as needed for headache (pain).  ? aspirin 81 MG EC tablet Take 1 tablet (81 mg total) by mouth daily. Swallow whole.  ? Cholecalciferol (VITAMIN D3) 125 MCG (5000 UT) CAPS Take 5,000 Units by mouth  daily.  ? clopidogrel (PLAVIX) 75 MG tablet Take 1 tablet (75 mg total) by mouth daily.  ? empagliflozin (JARDIANCE) 10 MG TABS tablet Take 1 tablet (10 mg total) by mouth daily before breakfast.  ? lamoTRIgine (LAMICTAL) 200 MG tablet Take 400 mg by mouth 2 (two) times daily.  ? levETIRAcetam (KEPPRA) 750 MG tablet Take 1,500 mg by mouth 2 (two) times daily.  ? LORazepam (ATIVAN) 1 MG tablet Take 1 mg by mouth 2 (two) times daily.  ? Multiple Vitamin (MULTIVITAMIN WITH MINERALS) TABS tablet Take 1 tablet by mouth daily.  ? nitroGLYCERIN (NITROSTAT) 0.4 MG SL  tablet Place 1 tablet (0.4 mg total) under the tongue every 5 (five) minutes as needed for chest pain.  ? PHENobarbital (LUMINAL) 64.8 MG tablet Take 64.8 mg by mouth 2 (two) times daily.  ? TRUEplus Lancets 28G MISC Use as directed  ?  ? ?Allergies:    ?Atorvastatin, Codeine, and Penicillins  ? ?Social History: ?Social History  ? ?Socioeconomic History  ? Marital status: Unknown  ?  Spouse name: Not on file  ? Number of children: Not on file  ? Years of education: Not on file  ? Highest education level: Not on file  ?Occupational History  ? Not on file  ?Tobacco Use  ? Smoking status: Former  ?  Types: Cigarettes  ? Smokeless tobacco: Never  ?Vaping Use  ? Vaping Use: Never used  ?Substance and Sexual Activity  ? Alcohol use: Not Currently  ? Drug use: Never  ? Sexual activity: Not on file  ?Other Topics Concern  ? Not on file  ?Social History Narrative  ? Not on file  ? ?Social Determinants of Health  ? ?Financial Resource Strain: Not on file  ?Food Insecurity: Not on file  ?Transportation Needs: Not on file  ?Physical Activity: Not on file  ?Stress: Not on file  ?Social Connections: Not on file  ?  ? ?Family History: ?The patient's family history includes Diabetes in his mother; Heart attack in his mother; Lung cancer in his father. ? ?ROS:   ?All other ROS reviewed and negative. Pertinent positives noted in the HPI.    ? ?EKGs/Labs/Other Studies Reviewed:   ?The following studies were personally reviewed by me today: ? ?TTE 09/22/2020 ? 1. Left ventricular ejection fraction by 3D volume is 56 %. The left  ?ventricle has normal function. The left ventricle demonstrates regional  ?wall motion abnormalities (see scoring diagram/findings for description).  ?Left ventricular diastolic parameters  ?are consistent with Grade II diastolic dysfunction (pseudonormalization).  ? 2. Right ventricular systolic function is normal. The right ventricular  ?size is normal. Tricuspid regurgitation signal is inadequate for  assessing  ?PA pressure.  ? 3. The mitral valve is normal in structure. Trivial mitral valve  ?regurgitation. No evidence of mitral stenosis.  ? 4. The aortic valve is grossly normal. Aortic valve regurgitation is not  ?visualized. No aortic stenosis is present.  ? 5. Aortic dilatation noted. There is mild dilatation of the aortic root,  ?measuring 40 mm.  ? 6. The inferior vena cava is normal in size with greater than 50%  ?respiratory variability, suggesting right atrial pressure of 3 mmHg.  ? ?Recent Labs: ?10/05/2020: Magnesium 1.8 ?01/18/2021: ALT 21 ?02/08/2021: TSH 3.490 ?04/24/2021: Hemoglobin 13.4; Platelets 282 ?05/10/2021: BUN 9; Creatinine, Ser 0.79; Potassium 4.5; Sodium 139  ? ?Recent Lipid Panel ?   ?Component Value Date/Time  ? CHOL 258 (H) 02/08/2021 1150  ? TRIG 187 (H)  02/08/2021 1150  ? HDL 53 02/08/2021 1150  ? CHOLHDL 4.9 02/08/2021 1150  ? CHOLHDL 5.7 09/24/2020 0323  ? VLDL 16 09/24/2020 0323  ? [place] 171 (H) 02/08/2021 1150  ? ? ?Physical Exam:   ?VS:  BP (!) 148/100   Pulse 66   Ht '5\' 6"'$  (1.676 m)   Wt 213 lb 12.8 oz (97 kg)   SpO2 95%   BMI 34.51 kg/m?    ?Wt Readings from Last 3 Encounters:  ?10/04/21 213 lb 12.8 oz (97 kg)  ?05/10/21 196 lb (88.9 kg)  ?04/23/21 187 lb 14.4 oz (85.2 kg)  ?  ?General: Well nourished, well developed, in no acute distress ?Head: Atraumatic, normal size  ?Eyes: PEERLA, EOMI  ?Neck: Supple, no JVD ?Endocrine: No thryomegaly ?Cardiac: Normal S1, S2; RRR; no murmurs, rubs, or gallops ?Lungs: Clear to auscultation bilaterally, no wheezing, rhonchi or rales  ?Abd: Soft, nontender, no hepatomegaly  ?Ext: No edema, pulses 2+ ?Musculoskeletal: No deformities, BUE and BLE strength normal and equal ?Skin: Warm and dry, no rashes   ?Neuro: Alert and oriented to person, place, time, and situation, CNII-XII grossly intact, no focal deficits  ?Psych: Normal mood and affect  ? ?ASSESSMENT:   ?Jeff Kelley is a 51 y.o. male who presents for the following: ?1. Coronary  artery disease involving native coronary artery of native heart without angina pectoris   ?2. Mixed hyperlipidemia   ?3. ICAO (internal carotid artery occlusion), right   ? ? ?PLAN:   ?1. Coronary artery disease

## 2021-10-04 ENCOUNTER — Encounter: Payer: Self-pay | Admitting: Cardiovascular Disease

## 2021-10-04 ENCOUNTER — Other Ambulatory Visit: Payer: Self-pay

## 2021-10-04 ENCOUNTER — Ambulatory Visit: Payer: Medicare HMO | Admitting: Cardiovascular Disease

## 2021-10-04 VITALS — BP 148/100 | HR 66 | Ht 66.0 in | Wt 213.8 lb

## 2021-10-04 DIAGNOSIS — E782 Mixed hyperlipidemia: Secondary | ICD-10-CM | POA: Diagnosis not present

## 2021-10-04 DIAGNOSIS — I6521 Occlusion and stenosis of right carotid artery: Secondary | ICD-10-CM

## 2021-10-04 DIAGNOSIS — I2510 Atherosclerotic heart disease of native coronary artery without angina pectoris: Secondary | ICD-10-CM | POA: Diagnosis not present

## 2021-10-04 NOTE — Patient Instructions (Signed)
Medication Instructions:  ?The current medical regimen is effective;  continue present plan and medications. ? ?*If you need a refill on your cardiac medications before your next appointment, please call your pharmacy* ? ? ?Follow-Up: ?At [facility], you and your health needs are our priority.  As part of our continuing mission to provide you with exceptional heart care, we have created designated Provider Care Teams.  These Care Teams include your primary Cardiologist (physician) and Advanced Practice Providers (APPs -  Physician Assistants and Nurse Practitioners) who all work together to provide you with the care you need, when you need it. ? ?We recommend signing up for the patient portal called "MyChart".  Sign up information is provided on this After Visit Summary.  MyChart is used to connect with patients for Virtual Visits (Telemedicine).  Patients are able to view lab/test results, encounter notes, upcoming appointments, etc.  Non-urgent messages can be sent to your provider as well.   ?To learn more about what you can do with MyChart, go to [email or web address].   ? ?Your next appointment:   ?6 month(s) ? ?The format for your next appointment:   ?In Person ? ?Provider:   ?Evalina Field, MD  or Sande Rives, PA-C, Almyra Deforest, PA-C, or Diona Browner, NP  ? ?Other Instructions ?Referral sent to our pharmacy department to discuss other options- they will be in contact for an appointment.  ? ?

## 2021-10-14 ENCOUNTER — Encounter: Payer: Self-pay | Admitting: *Deleted

## 2021-10-24 ENCOUNTER — Other Ambulatory Visit: Payer: Self-pay | Admitting: Cardiovascular Disease

## 2021-11-22 ENCOUNTER — Encounter: Payer: Self-pay | Admitting: Cardiovascular Disease

## 2022-01-21 ENCOUNTER — Telehealth: Payer: Self-pay | Admitting: Oncology

## 2022-01-21 NOTE — Telephone Encounter (Signed)
Attempted to contact patient in regards to scheduling appointment for patient, went straight to voicemail and was unable to leave voicemail so we called significant other to advise patient to call back.

## 2022-01-22 ENCOUNTER — Telehealth: Payer: Self-pay | Admitting: Oncology

## 2022-01-22 NOTE — Telephone Encounter (Signed)
Attempted to contact patient in regards to scheduling appointment for patient, went straight to voicemail and was unable to leave voicemail

## 2022-01-31 ENCOUNTER — Telehealth: Payer: Self-pay | Admitting: Oncology

## 2022-01-31 NOTE — Telephone Encounter (Signed)
Attempted to contact patient in regards to scheduling appointment for patient, went straight to voicemail and was unable to leave voicemail

## 2022-02-18 ENCOUNTER — Other Ambulatory Visit (HOSPITAL_COMMUNITY): Payer: Self-pay

## 2022-02-27 ENCOUNTER — Other Ambulatory Visit: Payer: Self-pay | Admitting: Interventional Cardiology

## 2022-02-27 ENCOUNTER — Ambulatory Visit
Admission: EM | Admit: 2022-02-27 | Discharge: 2022-02-27 | Disposition: A | Discharge status: Home / Self Care | Payer: Medicare HMO | Attending: Internal Medicine | Admitting: Internal Medicine

## 2022-02-27 ENCOUNTER — Encounter: Payer: Self-pay | Admitting: Emergency Medicine

## 2022-02-27 DIAGNOSIS — H938X2 Other specified disorders of left ear: Secondary | ICD-10-CM

## 2022-02-27 MED ORDER — OFLOXACIN 0.3 % OT SOLN: 10.0000 [drp] | Freq: Every day | OTIC | 0 refills | Status: AC

## 2022-02-27 NOTE — ED Triage Notes (Signed)
Pt is present today with a ear bud in his left ear. Pt denies any pain.

## 2022-02-27 NOTE — ED Provider Notes (Signed)
[facility]    CSN: [id number] Arrival date & time: 02/27/22  1426      History   Chief Complaint Chief Complaint  Patient presents with   Foreign Body in Ear    HPI Jeff Kelley is a 51 y.o. male.   Patient presents with concerns of foreign body inside his left ear.  Patient states that he thinks that the dome rubber piece of his earbud got stuck in his ear.  He is not sure if it is still there but states that when he took his earbud out it was not attached anymore.  He has tried to remove it with no improvement.  Denies any associated ear pain or discomfort.  Denies any drainage from the area   Foreign Body in Ear    Past Medical History:  Diagnosis Date   Colon cancer ([place])    Coronary artery disease    Diabetes mellitus without complication ([place])    Hyperlipidemia    Hypertension    Seizures ([place])     Patient Active Problem List   Diagnosis Date Noted   DM II (diabetes mellitus, type II), controlled ([place]) 04/23/2021   Essential hypertension 04/23/2021   Hyperlipidemia LDL goal <70 04/23/2021   CAD (coronary artery disease), native coronary artery 04/23/2021   Coronary artery disease    Abnormal echocardiogram 09/23/2020   Seizures (H[facility])    Occult GI bleeding    Elevated troponin    CHF (congestive heart failure) (H[facility])    Hyperglycemia    Iron deficiency anemia    Hyponatremia    Symptomatic anemia 09/21/2020    Past Surgical History:  Procedure Laterality Date   BIOPSY  09/24/2020   Procedure: BIOPSY;  Surgeon: Juanita Craver, MD;  Location: [place];  Service: Endoscopy;;   COLONOSCOPY WITH PROPOFOL N/A 09/24/2020   Procedure: COLONOSCOPY WITH PROPOFOL;  Surgeon: Juanita Craver, MD;  Location: [facility] ENDOSCOPY;  Service: Endoscopy;  Laterality: N/A;   CORONARY STENT INTERVENTION N/A 04/23/2021   Procedure: CORONARY STENT INTERVENTION;  Surgeon: Belva Crome, MD;  Location: [place] CV LAB;  Service: Cardiovascular;  Laterality: N/A;    ESOPHAGEAL BRUSHING  09/24/2020   Procedure: ESOPHAGEAL BRUSHING;  Surgeon: Juanita Craver, MD;  Location: [facility] ENDOSCOPY;  Service: Endoscopy;;   ESOPHAGOGASTRODUODENOSCOPY (EGD) WITH PROPOFOL N/A 09/24/2020   Procedure: ESOPHAGOGASTRODUODENOSCOPY (EGD) WITH PROPOFOL;  Surgeon: Juanita Craver, MD;  Location: [facility] ENDOSCOPY;  Service: Endoscopy;  Laterality: N/A;   LAPAROSCOPIC PARTIAL COLECTOMY N/A 09/27/2020   Procedure: LAPAROSCOPIC ASSISTED  PARTIAL COLECTOMY;  Surgeon: Jovita Kussmaul, MD;  Location: [place];  Service: General;  Laterality: N/A;   LEFT HEART CATH AND CORONARY ANGIOGRAPHY N/A 11/09/2020   Procedure: LEFT HEART CATH AND CORONARY ANGIOGRAPHY;  Surgeon: Belva Crome, MD;  Location: [place] CV LAB;  Service: Cardiovascular;  Laterality: N/A;   POLYPECTOMY  09/24/2020   Procedure: POLYPECTOMY;  Surgeon: Juanita Craver, MD;  Location: [facility] ENDOSCOPY;  Service: Endoscopy;;       Home Medications    Prior to Admission medications   Medication Sig Start Date End Date Taking? Authorizing Provider  ofloxacin (FLOXIN) 0.3 % OTIC solution Place 10 drops into the left ear daily for 7 days. 02/27/22 03/06/22 Yes Lexx Monte, Michele Rockers, FNP  acetaminophen (TYLENOL) 500 MG tablet Take 1,000 mg by mouth every 6 (six) hours as needed for headache (pain).    [provider]  aspirin (ASPIRIN LOW DOSE) 81 MG EC tablet TAKE 1 TABLET (81  MG TOTAL) BY MOUTH DAILY. SWALLOW WHOLE. 10/24/21   [name], Cassie Freer, MD  Cholecalciferol (VITAMIN D3) 125 MCG (5000 UT) CAPS Take 5,000 Units by mouth daily.    [provider]  clopidogrel (PLAVIX) 75 MG tablet Take 1 tablet (75 mg total) by mouth daily. 04/17/21   Belva Crome, MD  empagliflozin (JARDIANCE) 10 MG TABS tablet Take 1 tablet (10 mg total) by mouth daily before breakfast. 05/10/21   Loel Dubonnet, NP  ezetimibe (ZETIA) 10 MG tablet Take 1 tablet (10 mg total) by mouth daily. 05/10/21 05/10/22  Loel Dubonnet, NP  lamoTRIgine (LAMICTAL)  200 MG tablet Take 400 mg by mouth 2 (two) times daily.    [provider]  levETIRAcetam (KEPPRA) 750 MG tablet Take 1,500 mg by mouth 2 (two) times daily. 08/27/20   [provider]  LORazepam (ATIVAN) 1 MG tablet Take 1 mg by mouth 2 (two) times daily. 09/20/20   [provider]  metoprolol tartrate (LOPRESSOR) 25 MG tablet Take 1 tablet (25 mg total) by mouth 2 (two) times daily. 08/27/21 11/25/21  O'NealCassie Freer, MD  Multiple Vitamin (MULTIVITAMIN WITH MINERALS) TABS tablet Take 1 tablet by mouth daily.    [provider]  nitroGLYCERIN (NITROSTAT) 0.4 MG SL tablet Place 1 tablet (0.4 mg total) under the tongue every 5 (five) minutes as needed for chest pain. 11/05/20 10/04/21  Geralynn Rile, MD  pantoprazole (PROTONIX) 40 MG tablet Take 1 tablet (40 mg total) by mouth daily. 04/17/21   Belva Crome, MD  PHENobarbital (LUMINAL) 64.8 MG tablet Take 64.8 mg by mouth 2 (two) times daily. 09/19/20   [provider]  TRUEplus Lancets 28G MISC Use as directed 12/21/20   Camillia Herter, NP    Family History Family History  Problem Relation Age of Onset   Heart attack Mother    Diabetes Mother    Lung cancer Father     Social History Social History   Tobacco Use   Smoking status: Former    Types: Cigarettes   Smokeless tobacco: Never  Vaping Use   Vaping Use: Never used  Substance Use Topics   Alcohol use: Not Currently   Drug use: Never     Allergies   Atorvastatin, Codeine, and Penicillins   Review of Systems Review of Systems Per HPI  Physical Exam Triage Vital Signs ED Triage Vitals [02/27/22 1456]  Enc Vitals Group     BP 129/87     Pulse Rate 83     Resp 17     Temp 98 F (36.7 C)     Temp src      SpO2 96 %     Weight      Height      Head Circumference      Peak Flow      Pain Score 0     Pain Loc      Pain Edu?      Excl. in [place]?    No data found.  Updated Vital Signs BP 129/87   Pulse 83    Temp 98 F (36.7 C)   Resp 17   SpO2 96%   Visual Acuity Right Eye Distance:   Left Eye Distance:   Bilateral Distance:    Right Eye Near:   Left Eye Near:    Bilateral Near:     Physical Exam Constitutional:      General: He is not in acute distress.  Appearance: Normal appearance. He is not toxic-appearing or diaphoretic.  HENT:     Head: Normocephalic and atraumatic.     Left Ear: External ear normal.     Ears:     Comments: No foreign body present.  TM intact and normal.  There is a lot of irritation to left external canal with no swelling. Eyes:     Extraocular Movements: Extraocular movements intact.     Conjunctiva/sclera: Conjunctivae normal.  Pulmonary:     Effort: Pulmonary effort is normal.  Neurological:     General: No focal deficit present.     Mental Status: He is alert and oriented to person, place, and time. Mental status is at baseline.  Psychiatric:        Mood and Affect: Mood normal.        Behavior: Behavior normal.        Thought Content: Thought content normal.        Judgment: Judgment normal.      UC Treatments / Results  Labs (all labs ordered are listed, but only abnormal results are displayed) Labs Reviewed - No data to display  EKG   Radiology No results found.  Procedures Procedures (including critical care time)  Medications Ordered in UC Medications - No data to display  Initial Impression / Assessment and Plan / UC Course  I have reviewed the triage vital signs and the nursing notes.  Pertinent labs & imaging results that were available during my care of the patient were reviewed by me and considered in my medical decision making (see chart for details).     No signs of foreign body on exam.  TM is intact.  There is a lot of irritation to left external canal so will treat with ofloxacin eardrops.  Patient advised to follow-up if pain develops. Patient verbalized understanding and was agreeable with plan. Final  Clinical Impressions(s) / UC Diagnoses   Final diagnoses:  Irritation of left ear     Discharge Instructions      There is no foreign body in your ear.  You have been prescribed eardrops given irritation noted on exam.  Please follow-up if symptoms persist or worsen.    ED Prescriptions     Medication Sig Dispense Auth. Provider   ofloxacin (FLOXIN) 0.3 % OTIC solution Place 10 drops into the left ear daily for 7 days. 5 mL Teodora Medici, [place]      PDMP not reviewed this encounter.   Teodora Medici, [place] 02/27/22 1511

## 2022-02-27 NOTE — Discharge Instructions (Signed)
There is no foreign body in your ear.  You have been prescribed eardrops given irritation noted on exam.  Please follow-up if symptoms persist or worsen.

## 2022-02-28 NOTE — Telephone Encounter (Signed)
This is Dr. [name]'s pt 

## 2022-03-08 ENCOUNTER — Other Ambulatory Visit: Payer: Self-pay | Admitting: Interventional Cardiology

## 2022-03-10 NOTE — Telephone Encounter (Signed)
This is Dr. [name]'s pt 

## 2022-03-14 ENCOUNTER — Other Ambulatory Visit: Payer: Self-pay | Admitting: Cardiovascular Disease

## 2022-04-03 ENCOUNTER — Other Ambulatory Visit: Payer: Self-pay

## 2022-04-07 ENCOUNTER — Telehealth: Payer: Self-pay | Admitting: *Deleted

## 2022-04-07 NOTE — Telephone Encounter (Signed)
Jeff Kelley has not seen Dr. Benay Spice since 01/25/21. Attempted to reach him to schedule f/u without success. Sent him a Therapist, music.

## 2022-04-17 ENCOUNTER — Other Ambulatory Visit: Payer: Self-pay | Admitting: Interventional Cardiology

## 2022-04-17 ENCOUNTER — Other Ambulatory Visit (HOSPITAL_BASED_OUTPATIENT_CLINIC_OR_DEPARTMENT_OTHER): Payer: Self-pay | Admitting: Family

## 2022-04-17 NOTE — Telephone Encounter (Signed)
This is Dr. [name]'s pt 

## 2022-04-17 NOTE — Telephone Encounter (Signed)
Rx(s) sent to pharmacy electronically.  

## 2022-05-30 ENCOUNTER — Telehealth: Payer: Self-pay | Admitting: Cardiovascular Disease

## 2022-05-30 NOTE — Telephone Encounter (Signed)
Drue Dun, from [facility] called stating they sent over a fax on 11/9 and yesterday about drug therapy alert, for patient to start a statin.  They wanted to make sure it was received.

## 2022-06-04 NOTE — Telephone Encounter (Signed)
I attempted to contact x2 times, patient is intolerant to statins, we sent him to [facility]- he did not wish to scheduled for this yet.

## 2022-06-04 NOTE — Telephone Encounter (Signed)
[facility] calling back to follow up.

## 2022-06-07 ENCOUNTER — Other Ambulatory Visit: Payer: Self-pay | Admitting: Cardiovascular Disease

## 2022-06-11 NOTE — Telephone Encounter (Signed)
Unable to reach pt pharmacy or leave a message

## 2022-06-12 ENCOUNTER — Telehealth: Payer: Self-pay | Admitting: Cardiovascular Disease

## 2022-06-12 NOTE — Telephone Encounter (Signed)
*  STAT* If patient is at the pharmacy, call can be transferred to refill team.   1. Which medications need to be refilled? (please list name of each medication and dose if known)  metoprolol tartrate (LOPRESSOR) 25 MG tablet  2. Which pharmacy/location (including street and city if local pharmacy) is medication to be sent to? [facility]  Fax#: [phone] Phone#: [phone]  5. Do they need a 30 day or 90 day supply?  90 day supply

## 2022-06-13 MED ORDER — METOPROLOL TARTRATE 25 MG PO TABS: 25.0000 mg | ORAL_TABLET | Freq: Two times a day (BID) | ORAL | 3 refills | Status: AC

## 2022-07-11 ENCOUNTER — Telehealth: Payer: Self-pay | Admitting: Cardiovascular Disease

## 2022-07-11 MED ORDER — EMPAGLIFLOZIN 10 MG PO TABS: 10.0000 mg | ORAL_TABLET | Freq: Every day | ORAL | 1 refills | Status: DC

## 2022-07-11 MED ORDER — PANTOPRAZOLE SODIUM 40 MG PO TBEC: 40.0000 mg | DELAYED_RELEASE_TABLET | Freq: Every day | ORAL | 3 refills | Status: AC

## 2022-07-11 MED ORDER — EZETIMIBE 10 MG PO TABS
10.0000 mg | ORAL_TABLET | Freq: Every day | ORAL | 2 refills | Status: AC
Start: 2022-07-11 — End: ?

## 2022-07-11 MED ORDER — CLOPIDOGREL BISULFATE 75 MG PO TABS: 75.0000 mg | ORAL_TABLET | Freq: Every day | ORAL | 3 refills | Status: DC

## 2022-07-11 NOTE — Telephone Encounter (Signed)
*  STAT* If patient is at the pharmacy, call can be transferred to refill team.   1. Which medications need to be refilled? (please list name of each medication and dose if known)  JARDIANCE 10 MG TABS tablet ezetimibe (ZETIA) 10 MG tablet clopidogrel (PLAVIX) 75 MG tablet pantoprazole (PROTONIX) 40 MG tablet  2. Which pharmacy/location (including street and city if local pharmacy) is medication to be sent to? [place], [place]   3. Do they need a 30 day or 90 day supply?   90 day supply  Jeff Kelley with [facility] is requesting to have current prescriptions transferred from [facility] to [facility].

## 2022-10-06 ENCOUNTER — Other Ambulatory Visit (HOSPITAL_BASED_OUTPATIENT_CLINIC_OR_DEPARTMENT_OTHER): Payer: Self-pay | Admitting: Cardiovascular Disease

## 2022-11-28 ENCOUNTER — Other Ambulatory Visit: Payer: Self-pay | Admitting: Cardiovascular Disease

## 2023-03-17 IMAGING — CT CT ABD-PELV W/ CM
2 of 5 series · 14 of 46 positions shown, 16 images · IV contrast (APPLIED)
Comparison: 09/24/2020

CLINICAL DATA: Colorectal cancer surveillance, ascending colon mass
identified at colonoscopy, positive circulating tumor DNA

EXAM:
CT CHEST, ABDOMEN, AND PELVIS WITH CONTRAST
TECHNIQUE: Multidetector CT imaging of the chest, abdomen and pelvis was
performed following the standard protocol during bolus
administration of intravenous contrast.
CONTRAST:  75mL OMNIPAQUE IOHEXOL 300 MG/ML SOLN, additional oral
enteric contrast

[Series 2: cap with · axial · 0.80mm/px · z∈[+821,+1376]mm · 11 of 133 slices shown, 13 images]
[im 11/133  soft-tissue]
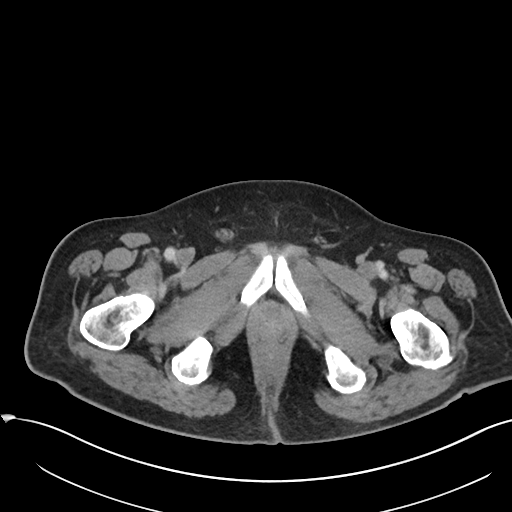
[im 11/133  bone]
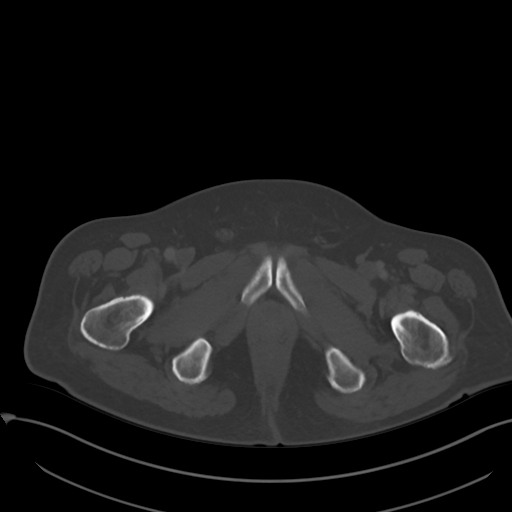
[im 21/133  soft-tissue]
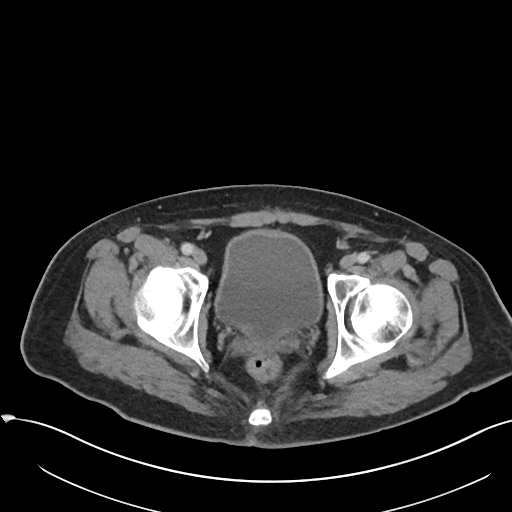
[im 31/133  soft-tissue]
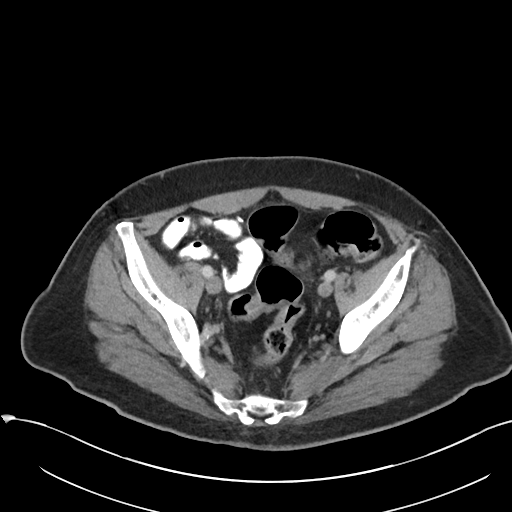
[im 41/133  soft-tissue]
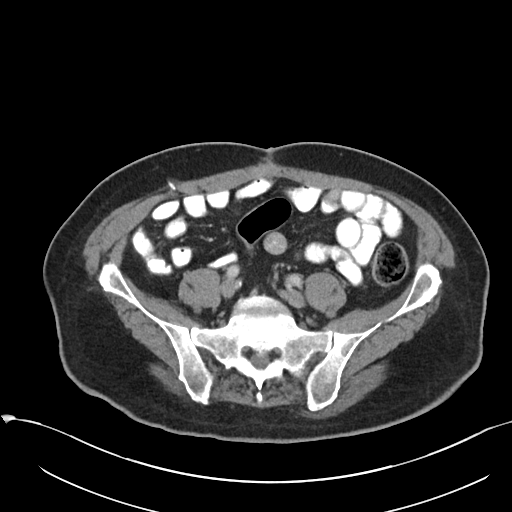
[im 51/133  soft-tissue]
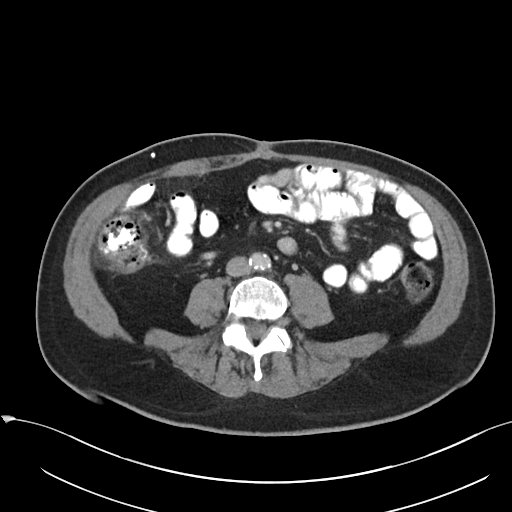
[im 72/133  soft-tissue]
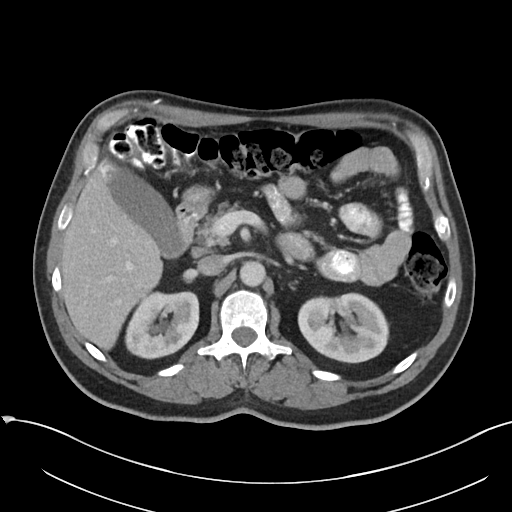
[im 82/133  soft-tissue]
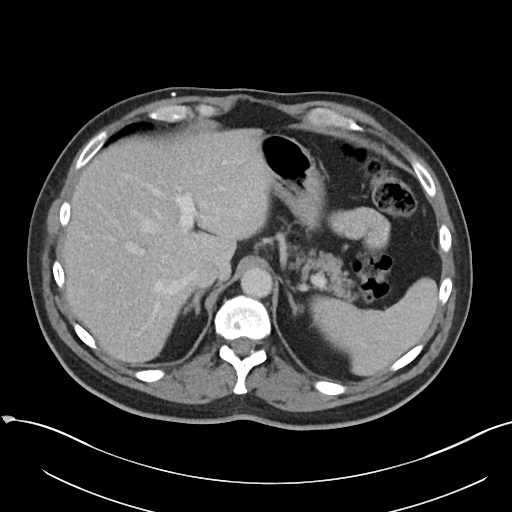
[im 92/133  soft-tissue]
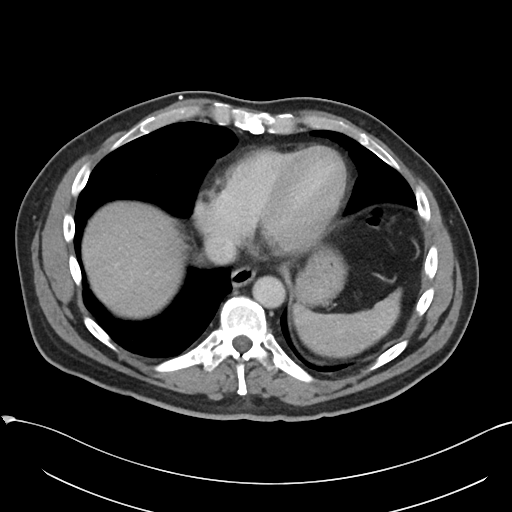
[im 102/133  soft-tissue]
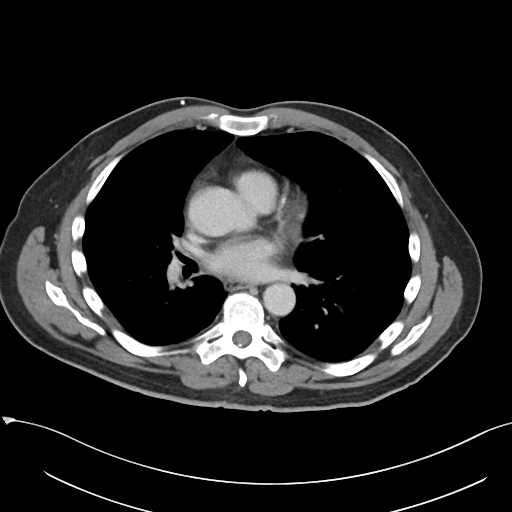
[im 102/133  bone]
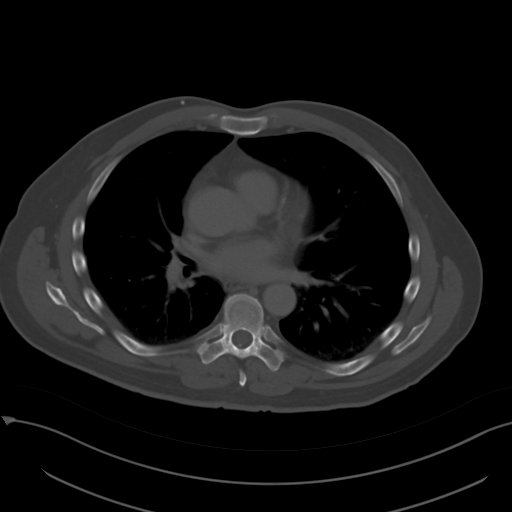
[im 112/133  soft-tissue]
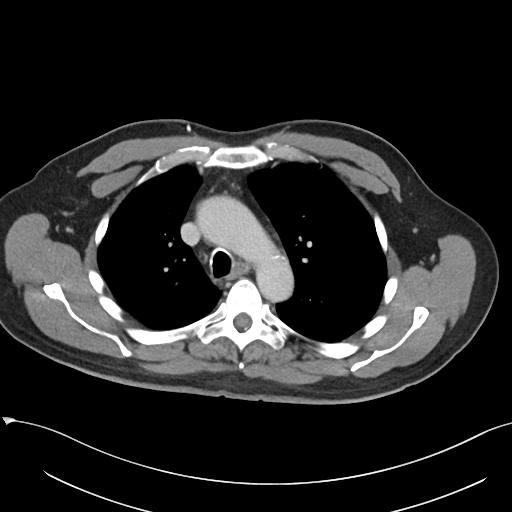
[im 122/133  soft-tissue]
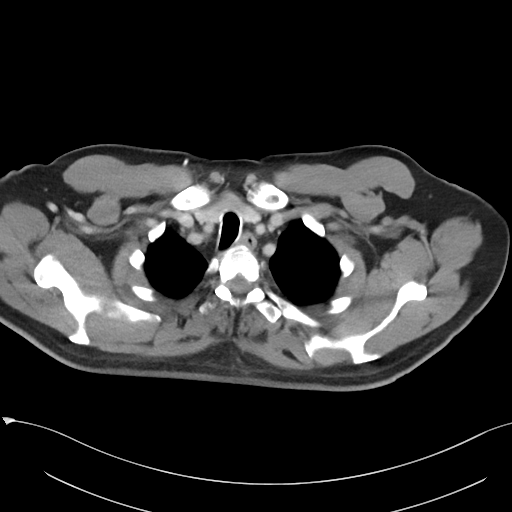

[Series 5: coronal · coronal · 0.83mm/px · 3 of 97 slices shown]
[im 33/97  soft-tissue]
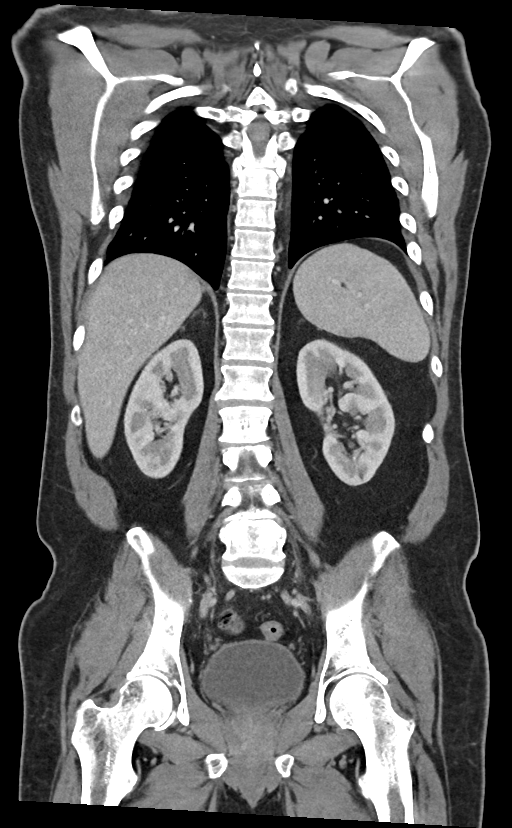
[im 43/97  soft-tissue]
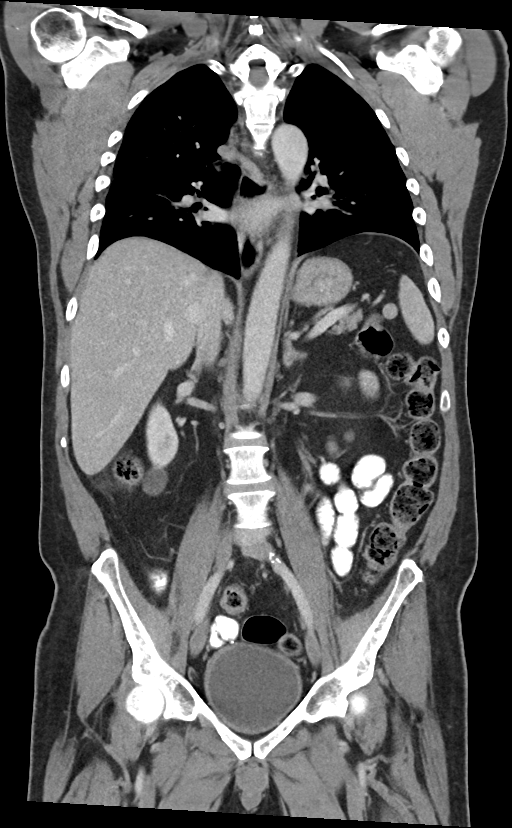
[im 54/97  soft-tissue]
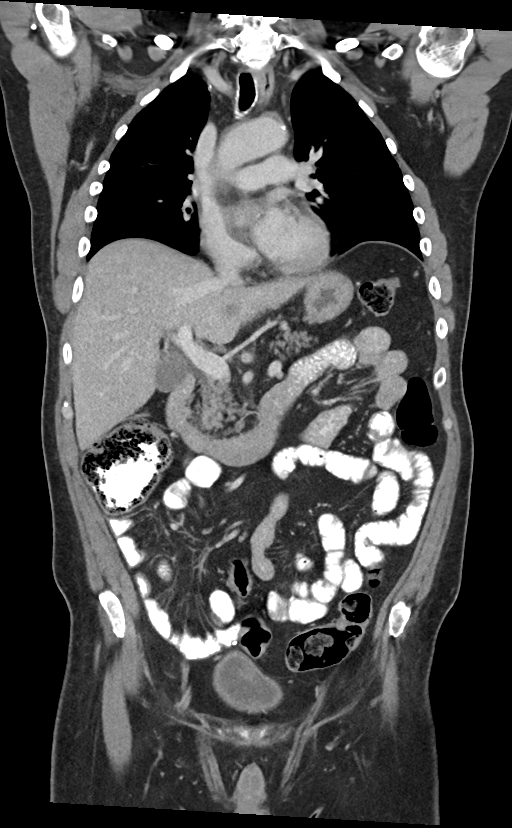

[14 of 46 positions shown; findings below may reference images not displayed]

FINDINGS: CT CHEST FINDINGS

Cardiovascular: Scattered aortic atherosclerosis. Unchanged
enlargement of the tubular ascending thoracic aorta, measuring up to
4.2 x 4.1 cm. Normal heart size. Three-vessel coronary artery
calcifications. No pericardial effusion.

Mediastinum/Nodes: No enlarged mediastinal, hilar, or axillary lymph
nodes. Thyroid gland, trachea, and esophagus demonstrate no
significant findings.

Lungs/Pleura: Mild, diffuse bilateral bronchial wall thickening. No
pleural effusion or pneumothorax.

Musculoskeletal: No chest wall mass or suspicious bone lesions
identified. Shunt catheter tubing courses through the superficial
soft tissues of the right neck and chest.

CT ABDOMEN PELVIS FINDINGS

Hepatobiliary: No solid liver abnormality is seen. No gallstones,
gallbladder wall thickening, or biliary dilatation.

Pancreas: Unremarkable. No pancreatic ductal dilatation or
surrounding inflammatory changes.

Spleen: Normal in size without significant abnormality.

Adrenals/Urinary Tract: Adrenal glands are unremarkable. Small
nonobstructive calculus of the inferior pole of the left kidney.
Bladder is unremarkable.

Stomach/Bowel: Stomach is within normal limits. Status post interval
right hemicolectomy and reanastomosis.

Vascular/Lymphatic: Aortic atherosclerosis. No enlarged abdominal or
pelvic lymph nodes.

Reproductive: No mass or other abnormality.

Other: No abdominal wall hernia or abnormality. No abdominopelvic
ascites. Shunt catheter tubing tip in the right hemipelvis (series
2, image 105)

Musculoskeletal: No acute or significant osseous findings.
IMPRESSION: 1. Status post interval right hemicolectomy and reanastomosis.
2. No evidence of recurrent metastatic disease in the chest,
abdomen, or pelvis on this postoperative baseline.
3. Unchanged enlargement of the tubular ascending thoracic aorta,
measuring up to 4.2 x 4.1 cm. Recommend annual imaging followup by
CTA or MRA if not otherwise imaged. This recommendation follows 8414
ACCF/AHA/AATS/ACR/ASA/SCA/FAD/JUMPER/FLORENCIA/MANZOOR Guidelines for the
Diagnosis and Management of Patients with Thoracic Aortic Disease.
Circulation. 8414; 121: E266-e369. Aortic aneurysm NOS (JGVWA-CUI.Z)
4. Coronary artery disease.
5. Peritoneal shunt catheter tubing, tip in the right hemipelvis.
6. Nonobstructive left nephrolithiasis.

Aortic Atherosclerosis ([id number]).

## 2023-06-03 NOTE — Telephone Encounter (Signed)
Telephone call  

## 2023-06-06 ENCOUNTER — Other Ambulatory Visit: Payer: Self-pay | Admitting: Cardiovascular Disease

## 2023-06-29 ENCOUNTER — Other Ambulatory Visit: Payer: Self-pay | Admitting: Cardiovascular Disease
